# Patient Record
Sex: Female | Born: 1980 | Race: Black or African American | Hispanic: No | Marital: Single | Smoking: Never smoker
Health system: Southern US, Community
[De-identification: ages and names within clinical notes are randomized; demographics above are authoritative.]

## PROBLEM LIST (undated history)

## (undated) ENCOUNTER — Emergency Department (HOSPITAL_COMMUNITY): Payer: 59

## (undated) DIAGNOSIS — Z8619 Personal history of other infectious and parasitic diseases: Secondary | ICD-10-CM

## (undated) DIAGNOSIS — R87619 Unspecified abnormal cytological findings in specimens from cervix uteri: Secondary | ICD-10-CM

## (undated) DIAGNOSIS — E669 Obesity, unspecified: Secondary | ICD-10-CM

## (undated) DIAGNOSIS — G5600 Carpal tunnel syndrome, unspecified upper limb: Secondary | ICD-10-CM

## (undated) DIAGNOSIS — M722 Plantar fascial fibromatosis: Secondary | ICD-10-CM

## (undated) DIAGNOSIS — Z8613 Personal history of malaria: Secondary | ICD-10-CM

## (undated) DIAGNOSIS — M654 Radial styloid tenosynovitis [de Quervain]: Secondary | ICD-10-CM

## (undated) DIAGNOSIS — O24419 Gestational diabetes mellitus in pregnancy, unspecified control: Secondary | ICD-10-CM

## (undated) DIAGNOSIS — K7460 Unspecified cirrhosis of liver: Secondary | ICD-10-CM

## (undated) DIAGNOSIS — F810 Specific reading disorder: Secondary | ICD-10-CM

## (undated) DIAGNOSIS — N200 Calculus of kidney: Secondary | ICD-10-CM

## (undated) DIAGNOSIS — IMO0002 Reserved for concepts with insufficient information to code with codable children: Secondary | ICD-10-CM

## (undated) DIAGNOSIS — Z862 Personal history of diseases of the blood and blood-forming organs and certain disorders involving the immune mechanism: Secondary | ICD-10-CM

## (undated) DIAGNOSIS — Z8632 Personal history of gestational diabetes: Secondary | ICD-10-CM

## (undated) HISTORY — DX: Obesity, unspecified: E66.9

## (undated) HISTORY — DX: Unspecified abnormal cytological findings in specimens from cervix uteri: R87.619

## (undated) HISTORY — DX: Calculus of kidney: N20.0

## (undated) HISTORY — DX: Personal history of other infectious and parasitic diseases: Z86.19

## (undated) HISTORY — DX: Personal history of gestational diabetes: Z86.32

## (undated) HISTORY — DX: Personal history of diseases of the blood and blood-forming organs and certain disorders involving the immune mechanism: Z86.2

## (undated) HISTORY — DX: Personal history of malaria: Z86.13

## (undated) HISTORY — DX: Plantar fascial fibromatosis: M72.2

## (undated) HISTORY — DX: Reserved for concepts with insufficient information to code with codable children: IMO0002

## (undated) HISTORY — DX: Unspecified cirrhosis of liver: K74.60

## (undated) HISTORY — DX: Specific reading disorder: F81.0

## (undated) HISTORY — DX: Carpal tunnel syndrome, unspecified upper limb: G56.00

---

## 2001-07-08 DIAGNOSIS — Z8613 Personal history of malaria: Secondary | ICD-10-CM

## 2001-07-08 HISTORY — DX: Personal history of malaria: Z86.13

## 2005-06-24 ENCOUNTER — Encounter: Admission: RE | Admit: 2005-06-24 | Discharge: 2005-06-24 | Payer: Self-pay | Admitting: Internal Medicine

## 2005-07-08 DIAGNOSIS — Z8619 Personal history of other infectious and parasitic diseases: Secondary | ICD-10-CM

## 2005-07-08 HISTORY — DX: Personal history of other infectious and parasitic diseases: Z86.19

## 2006-03-21 ENCOUNTER — Inpatient Hospital Stay (HOSPITAL_COMMUNITY): Admission: AD | Admit: 2006-03-21 | Discharge: 2006-03-21 | Payer: Self-pay | Admitting: Family Medicine

## 2006-04-12 ENCOUNTER — Inpatient Hospital Stay (HOSPITAL_COMMUNITY): Admission: AD | Admit: 2006-04-12 | Discharge: 2006-04-12 | Payer: Self-pay | Admitting: Obstetrics and Gynecology

## 2006-06-03 ENCOUNTER — Ambulatory Visit (HOSPITAL_COMMUNITY): Admission: RE | Admit: 2006-06-03 | Discharge: 2006-06-03 | Payer: Self-pay | Admitting: Gynecology

## 2006-06-05 ENCOUNTER — Ambulatory Visit: Payer: Self-pay | Admitting: Obstetrics & Gynecology

## 2006-06-06 ENCOUNTER — Ambulatory Visit (HOSPITAL_COMMUNITY): Admission: RE | Admit: 2006-06-06 | Discharge: 2006-06-06 | Payer: Self-pay | Admitting: Family Medicine

## 2006-06-12 ENCOUNTER — Ambulatory Visit: Payer: Self-pay | Admitting: *Deleted

## 2006-06-26 ENCOUNTER — Ambulatory Visit: Payer: Self-pay | Admitting: Family Medicine

## 2006-06-27 ENCOUNTER — Ambulatory Visit: Payer: Self-pay | Admitting: Cardiovascular Disease

## 2006-07-04 ENCOUNTER — Ambulatory Visit (HOSPITAL_COMMUNITY): Admission: RE | Admit: 2006-07-04 | Discharge: 2006-07-04 | Payer: Self-pay | Admitting: Family Medicine

## 2006-07-10 ENCOUNTER — Ambulatory Visit: Payer: Self-pay | Admitting: Family Medicine

## 2006-07-24 ENCOUNTER — Ambulatory Visit: Payer: Self-pay | Admitting: Family Medicine

## 2006-07-30 ENCOUNTER — Ambulatory Visit (HOSPITAL_COMMUNITY): Admission: RE | Admit: 2006-07-30 | Discharge: 2006-07-30 | Payer: Self-pay | Admitting: Family Medicine

## 2006-08-21 ENCOUNTER — Ambulatory Visit: Payer: Self-pay | Admitting: Obstetrics & Gynecology

## 2006-08-25 ENCOUNTER — Ambulatory Visit: Payer: Self-pay | Admitting: Obstetrics & Gynecology

## 2006-08-28 ENCOUNTER — Ambulatory Visit (HOSPITAL_COMMUNITY): Admission: RE | Admit: 2006-08-28 | Discharge: 2006-08-28 | Payer: Self-pay | Admitting: Family Medicine

## 2006-08-28 ENCOUNTER — Ambulatory Visit: Payer: Self-pay | Admitting: Obstetrics & Gynecology

## 2006-09-01 ENCOUNTER — Ambulatory Visit: Payer: Self-pay | Admitting: Family Medicine

## 2006-09-08 ENCOUNTER — Ambulatory Visit: Payer: Self-pay | Admitting: Obstetrics & Gynecology

## 2006-09-11 ENCOUNTER — Ambulatory Visit: Payer: Self-pay | Admitting: Obstetrics & Gynecology

## 2006-09-15 ENCOUNTER — Ambulatory Visit: Payer: Self-pay | Admitting: *Deleted

## 2006-09-22 ENCOUNTER — Ambulatory Visit (HOSPITAL_COMMUNITY): Admission: RE | Admit: 2006-09-22 | Discharge: 2006-09-22 | Payer: Self-pay | Admitting: Obstetrics & Gynecology

## 2006-09-22 ENCOUNTER — Ambulatory Visit: Payer: Self-pay | Admitting: Obstetrics & Gynecology

## 2006-09-25 ENCOUNTER — Ambulatory Visit: Payer: Self-pay | Admitting: Obstetrics & Gynecology

## 2006-09-26 ENCOUNTER — Ambulatory Visit (HOSPITAL_COMMUNITY): Admission: RE | Admit: 2006-09-26 | Discharge: 2006-09-26 | Payer: Self-pay | Admitting: Family Medicine

## 2006-10-03 ENCOUNTER — Ambulatory Visit: Payer: Self-pay | Admitting: Gynecology

## 2006-10-03 ENCOUNTER — Ambulatory Visit (HOSPITAL_COMMUNITY): Admission: RE | Admit: 2006-10-03 | Discharge: 2006-10-03 | Payer: Self-pay | Admitting: Obstetrics & Gynecology

## 2006-10-06 ENCOUNTER — Ambulatory Visit: Payer: Self-pay | Admitting: *Deleted

## 2006-10-09 ENCOUNTER — Ambulatory Visit: Payer: Self-pay | Admitting: Family Medicine

## 2006-10-16 ENCOUNTER — Ambulatory Visit (HOSPITAL_COMMUNITY): Admission: RE | Admit: 2006-10-16 | Discharge: 2006-10-16 | Payer: Self-pay | Admitting: Family Medicine

## 2006-10-28 ENCOUNTER — Inpatient Hospital Stay (HOSPITAL_COMMUNITY): Admission: RE | Admit: 2006-10-28 | Discharge: 2006-11-01 | Payer: Self-pay | Admitting: Obstetrics & Gynecology

## 2006-10-28 ENCOUNTER — Ambulatory Visit: Payer: Self-pay | Admitting: *Deleted

## 2006-10-28 ENCOUNTER — Ambulatory Visit: Payer: Self-pay | Admitting: Obstetrics & Gynecology

## 2006-10-29 ENCOUNTER — Encounter (INDEPENDENT_AMBULATORY_CARE_PROVIDER_SITE_OTHER): Payer: Self-pay | Admitting: *Deleted

## 2006-11-17 ENCOUNTER — Encounter: Admission: RE | Admit: 2006-11-17 | Discharge: 2007-02-15 | Payer: Self-pay | Admitting: Obstetrics & Gynecology

## 2007-03-26 ENCOUNTER — Emergency Department (HOSPITAL_COMMUNITY): Admission: EM | Admit: 2007-03-26 | Discharge: 2007-03-27 | Payer: Self-pay | Admitting: Emergency Medicine

## 2008-09-17 IMAGING — US US OB COMP LESS 14 WK
1 series · 14 of 26 positions shown · non-contrast
Comparison: 03/21/06.

CLINICAL DATA: 11 weeks pregnant, vaginal bleeding.

 OBSTETRICAL ULTRASOUND <14 WKS:

[Series 1: us ob comp less 14 wk · 0.27mm/px · 14 of 26 slices shown]
[im 1/26]
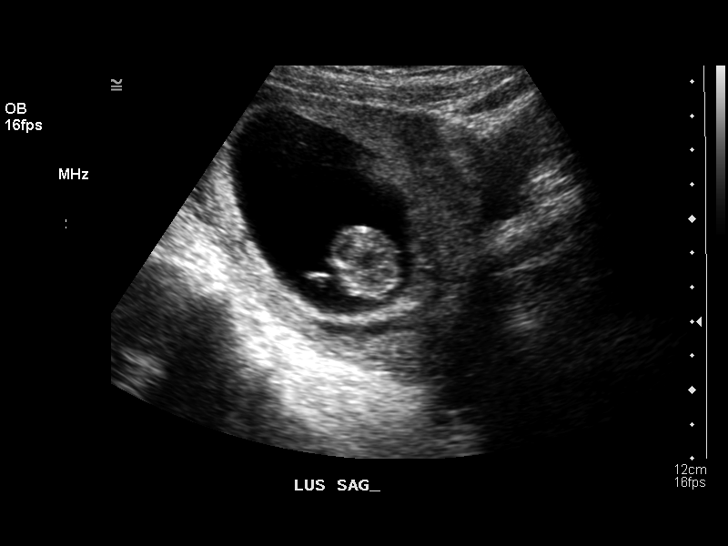
[im 3/26]
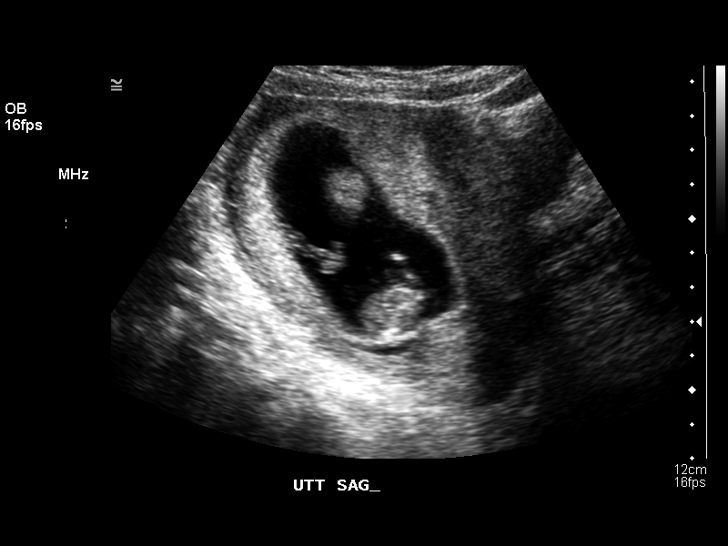
[im 5/26]
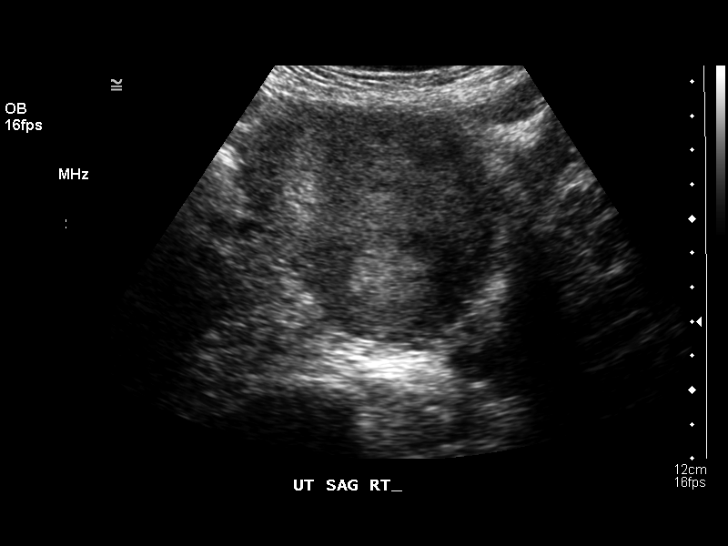
[im 7/26]
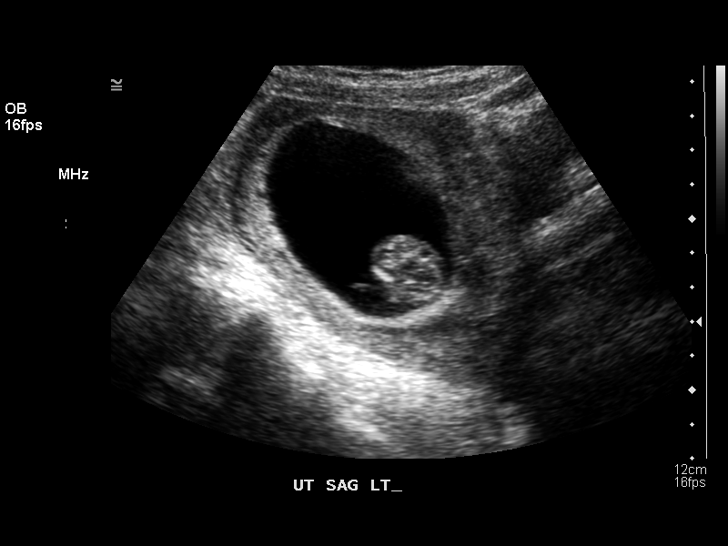
[im 9/26]
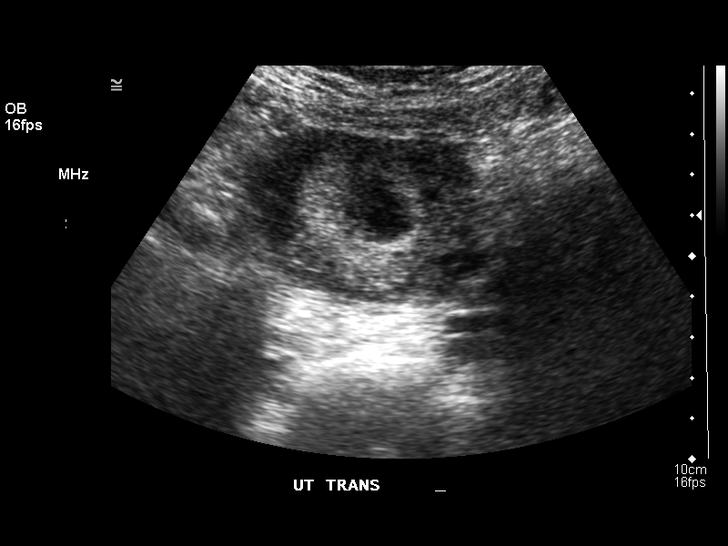
[im 11/26]
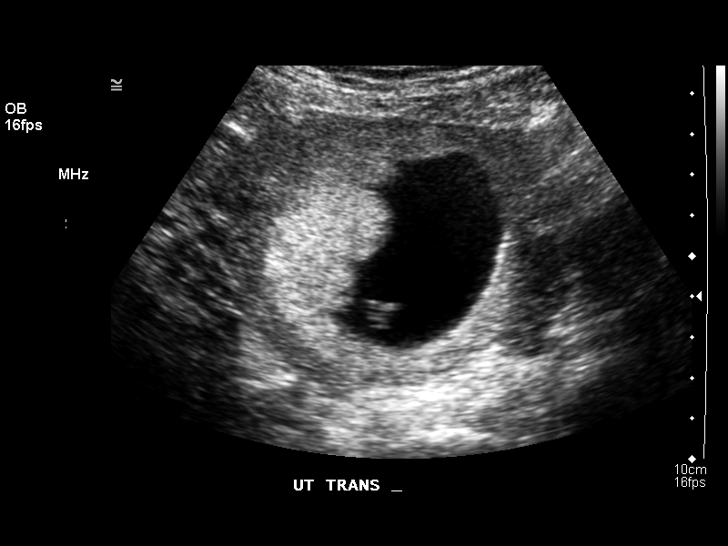
[im 13/26]
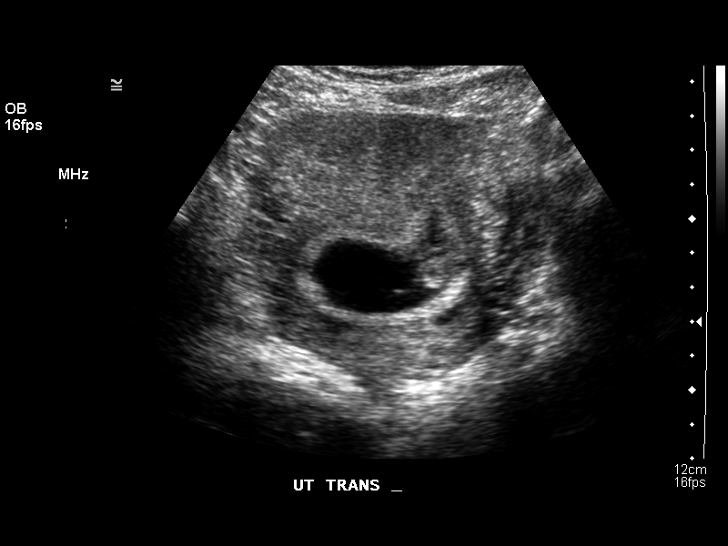
[im 14/26]
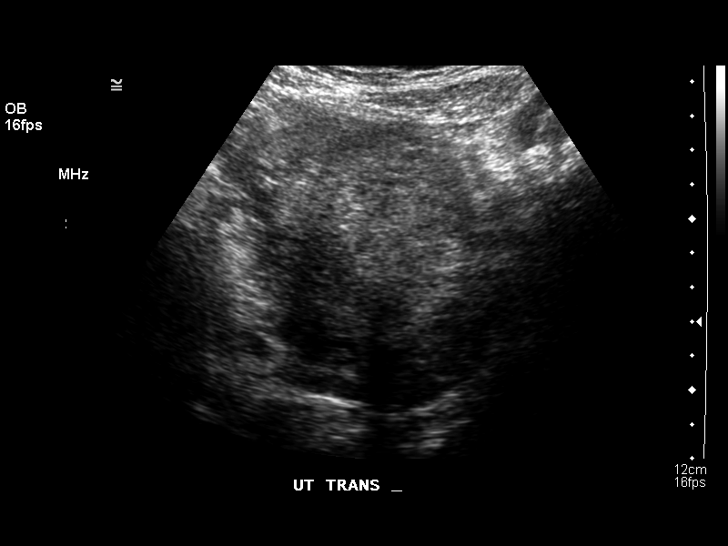
[im 16/26]
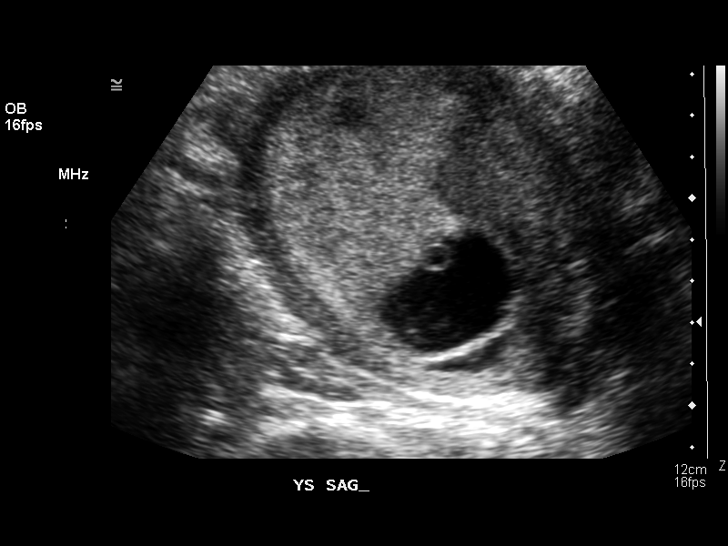
[im 18/26]
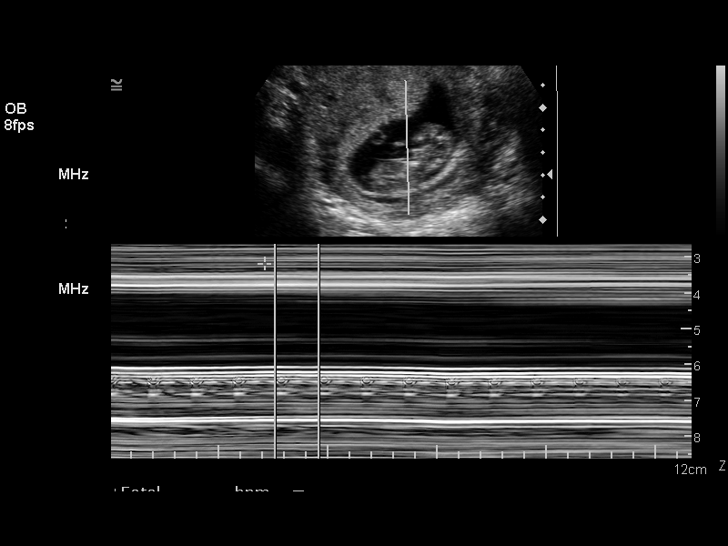
[im 20/26]
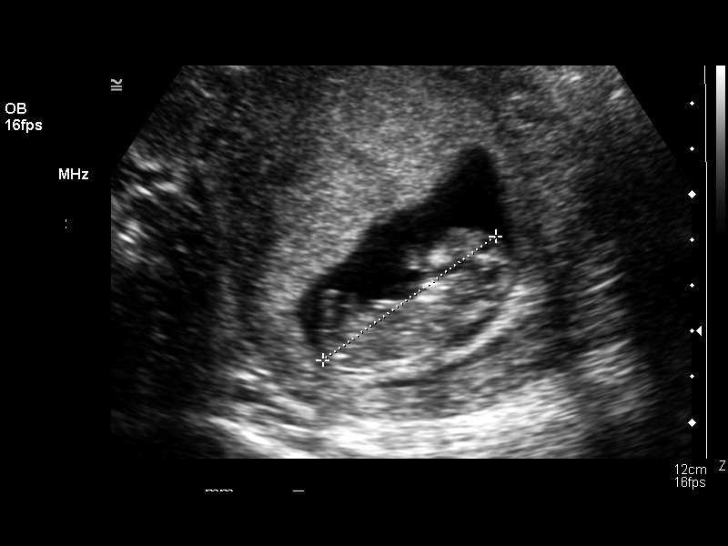
[im 22/26]
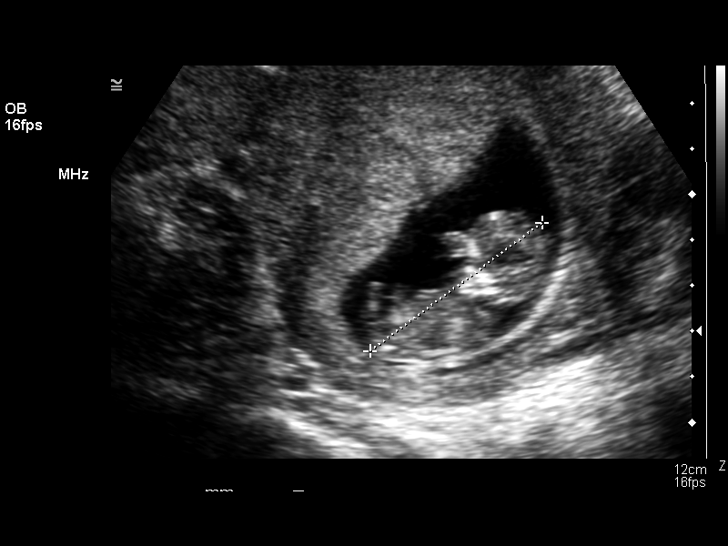
[im 24/26]
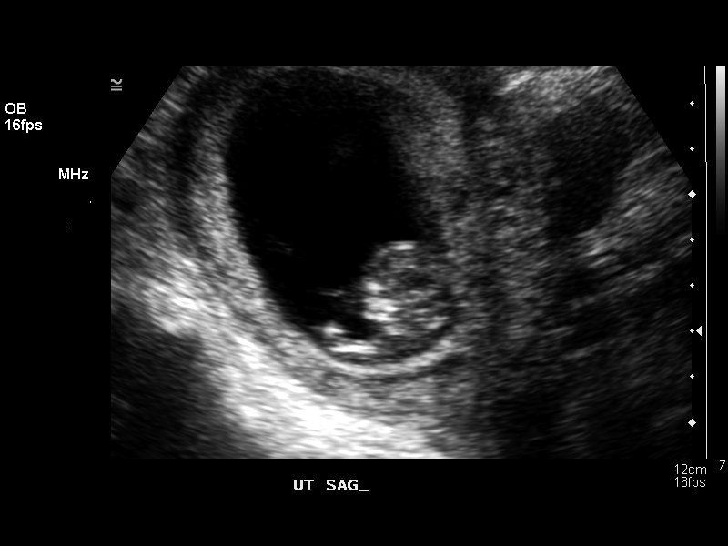
[im 26/26]
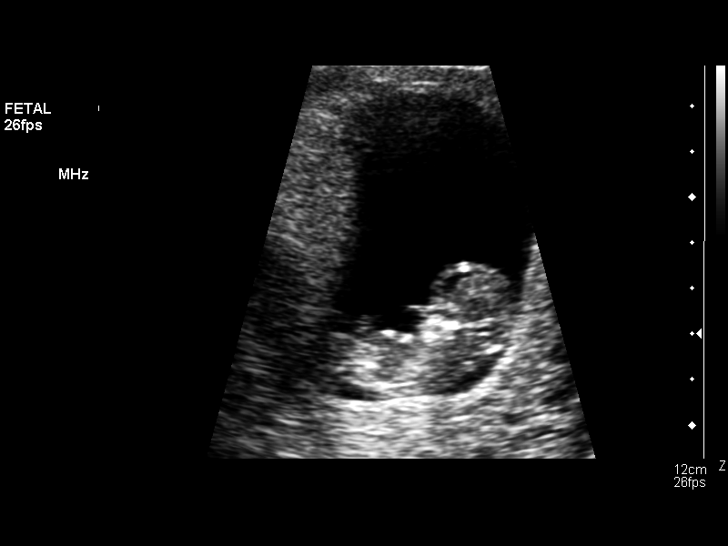

[14 of 26 positions shown; findings below may reference images not displayed]

Number of Fetuses:  1
 Heart Rate:  150 bpm

 CRL:  4.7 cm  11 w 3 d  US EDC:   10/29/06

 Fetal anatomy could not be evaluated due to the early gestational age.

 MATERNAL UTERINE AND ADNEXAL FINDINGS
 Cervix:  Not evaluated; <16 weeks.
IMPRESSION: Single live intrauterine gestation with average ultrasound age 11 weeks 3 days, which is concordant with the gestational age by first ultrasound of 11 weeks 1 day (EDC 10/31/06).  No acute findings seen.

## 2009-06-19 ENCOUNTER — Emergency Department (HOSPITAL_COMMUNITY): Admission: EM | Admit: 2009-06-19 | Discharge: 2009-06-19 | Payer: Self-pay | Admitting: Emergency Medicine

## 2010-07-29 ENCOUNTER — Encounter: Payer: Self-pay | Admitting: *Deleted

## 2010-10-09 LAB — POCT URINALYSIS DIP (DEVICE)
Glucose, UA: NEGATIVE mg/dL
Nitrite: NEGATIVE
Urobilinogen, UA: 1 mg/dL (ref 0.0–1.0)
pH: 5.5 (ref 5.0–8.0)

## 2010-10-09 LAB — WET PREP, GENITAL: Yeast Wet Prep HPF POC: NONE SEEN

## 2010-10-16 ENCOUNTER — Emergency Department (HOSPITAL_COMMUNITY)
Admission: EM | Admit: 2010-10-16 | Discharge: 2010-10-16 | Disposition: A | Discharge status: Home / Self Care | Payer: Worker's Compensation | Attending: Emergency Medicine | Admitting: Emergency Medicine

## 2010-10-16 DIAGNOSIS — Y929 Unspecified place or not applicable: Secondary | ICD-10-CM | POA: Insufficient documentation

## 2010-10-16 DIAGNOSIS — X500XXA Overexertion from strenuous movement or load, initial encounter: Secondary | ICD-10-CM | POA: Insufficient documentation

## 2010-10-16 DIAGNOSIS — S335XXA Sprain of ligaments of lumbar spine, initial encounter: Secondary | ICD-10-CM | POA: Insufficient documentation

## 2010-10-16 DIAGNOSIS — M545 Low back pain, unspecified: Secondary | ICD-10-CM | POA: Insufficient documentation

## 2010-10-16 DIAGNOSIS — Y990 Civilian activity done for income or pay: Secondary | ICD-10-CM | POA: Insufficient documentation

## 2010-10-16 DIAGNOSIS — Y93E2 Activity, laundry: Secondary | ICD-10-CM | POA: Insufficient documentation

## 2010-10-16 DIAGNOSIS — M2560 Stiffness of unspecified joint, not elsewhere classified: Secondary | ICD-10-CM | POA: Insufficient documentation

## 2010-11-23 NOTE — Discharge Summary (Signed)
NAMEJordyne, Poehlman Jeanna             ACCOUNT NO.:  [id number]   MEDICAL RECORD NO.:  [id number]           PATIENT TYPE:   LOCATION:                                FACILITY:  [facility]   PHYSICIAN:  Tracy L. Mayford Knife, M.D.DATE OF BIRTH:  1981-05-08   DATE OF ADMISSION:  10/28/2006  DATE OF DISCHARGE:  11/01/2006                               DISCHARGE SUMMARY   DISCHARGE DIAGNOSES:  1. Status post primary low transverse cesarean section for failure to      progress.  2. Gestational diabetes, A2, uncontrolled.  3. Group B strep positive.  4. Thrombocytopenia, stable.  5. Anemia, secondary to acute blood loss, stable.  6. Hepatitis B surface antigen positive.   LABORATORY DATA:  Admit hemoglobin 12, platelets 109, AST 44, ALT 22.  Hemoglobin A1c 5.7.  Discharge hemoglobin 8.1, platelets 107, AST 46,  ALT 20.   HISTORY OF PRESENT ILLNESS:  Patient is a 30 year old G4, P1-1-1-1 at 39  weeks, 4 days, dated via LMP and 8-week ultrasound.  She presented to  clinic for routine care.  Patient had not been seen since April 10.  Certified letters and multiple calls had been made.  When patient did  come in, her fasting test was between 60 to 90 and two-hour postprandial  test between 70 and 137.  BPT was 8/8.  Nonstress test was reactive.  Patient was admitted for induction because she was on insulin for her  gestational diabetes.   Please note that pregnancy course was complicated by her having  gestational diabetes, Group B strep bacteruria, hepatitis B surface  antigen positive, thrombocytopenia, which was thought to be secondary to  her hepatitis B history.  The patient also had a history of having an  IUFD at 26 weeks, when she lived in [place].  Patient reports that she  was on __________ for two days and that is why she was told she had the  IUFD.   Thrombophilia workup was normal, except there was a borderline decreased  protein C.  Dr. Margot Ables encouraged repeating this when she was  not  pregnant.   HOSPITAL COURSE:  The patient had a girth ultrasound.  Estimated fetal  weight was 4392 g with the abdominal circumference measuring greater  than the 87th percentile.  Patient was offered a primary cesarean  section, which she declined.  Pitocin augmentation was started.  Glucommander was started.  Patient progressed to 3 cm and had documented  [place] units greater than 334 hours.  Based on this, patient was  consented for primary cesarean section for failure to progress.  Surgery  was done without complication.  Please see operative note.  Baby was in  the vertex LOA presentation.   Postoperative course went well, except patient had incisional pain.  No  signs of infection.  She remained afebrile with a normal white count.  Blood count remained stable.  Her platelets went as low as 85,000, then  on discharge were back up to the baseline of 107,000.  Fasting CBG were  followed and were in the 90s to low 100s.   DISPOSITION:  Home.   FOLLOWUP:  Baby will nurse.  Take out staples on day 5 to day 7.  If  this is not done, she knows to call the clinic to make an appointment to  get those out.  Followup at the health department in six weeks, where  she will also have a two-hour glucose tolerance test to make sure that  she is not diabetic, even when not pregnant.   DISCHARGE MEDICATIONS:  1. Ibuprofen one p.o. q. 4-6 hours pain.  2. Percocet 5 one to two q. 4-6 pain.  3. Ferrous sulfate 325 one tab p.o. daily.  4. Colace 100 mg p.o. b.i.d.   CONTRACEPTION:  Patient will decide at six-week postpartum visit.   DIET:  Regular.   ACTIVITY:  Nothing per vagina.  No heavy lifting times six weeks.   DISCHARGE INSTRUCTIONS:  Routine postoperative orders given.           ______________________________  Marc Morgans. Mayford Knife, M.D.     TLW/MEDQ  D:  11/01/2006  T:  11/01/2006  Job:  [id number]

## 2010-11-23 NOTE — Op Note (Signed)
NAMEPhylisha, Brenda Cannon             ACCOUNT NO.:  [id number]   MEDICAL RECORD NO.:  [id number]          PATIENT TYPE:  WOC   LOCATION:  WOC                          FACILITY:  [facility]   PHYSICIAN:  Lesly Dukes, M.D. DATE OF BIRTH:  [birth year]   DATE OF PROCEDURE:  10/29/2006  DATE OF DISCHARGE:                               OPERATIVE REPORT   PREOPERATIVE DIAGNOSIS:  A 30 year old female at 39/4 weeks estimated  gestational age with failure to dilate and uncontrolled diabetes, and  hepatitis-B.   POSTOPERATIVE DIAGNOSIS:  A 30 year old female at 39/4 weeks estimated  gestational age with failure to dilate and uncontrolled diabetes, and  hepatitis-B.   PROCEDURE PERFORMED:  Primary low transverse cesarean section.   SURGEON:  Lesly Dukes, M.D.   ASSISTANT:  Caren Griffins, C.N.M.   ANESTHESIA:  Epidural.   PATHOLOGY:  Placenta.   ESTIMATED BLOOD LOSS:  1000.   COMPLICATIONS:  None.   FINDINGS:  Viable female infant with Apgars 9 at one minute and 9 at five  minutes.  Vertex/LOA presentation.  Weight 7 pounds, 12 ounces.  Clear  fluid.  Grossly normal placenta with three vessel cord.  Grossly normal  uterus, ovaries and fallopian tubes.   DESCRIPTION OF PROCEDURE:  After informed consent was obtained, the  patient was taken to the operating room where epidural anesthesia was  found to be adequate.  The patient was placed in the dorsal lithotomy  position with leftward tilt, with a Foley in the bladder.  The patient  was prepared and draped in the normal sterile fashion.  A Pfannenstiel  skin incision was made with the scalpel and carried down to the fascia.  The fascia was incised in the midline and the incision extended  bilaterally with Mayo scissors.  The superior and inferior aspect of the  fascial incision were grasped with Kocher clamps, tented up and  dissected sharply and bluntly from the underlying layers of rectus  muscles.  The rectus muscles were separated  in the midline and the  peritoneum entered bluntly and this incision was then extended  superiorly and inferiorly with good visualization of the bladder.  A  bladder blade was inserted.  A bladder flap was created and the uterine  incision was made in transverse fashion in the lower uterine segment.  This incision was extended bilaterally bluntly.  The baby's head was  delivered atraumatically.  The nose and mouth were suctioned.  The cord  was clamped and cut and the baby handed to the waiting pediatrician.  Cord blood was sent for type and screen.  The placenta was delivered  manually and had a three vessel cord.  The uterus was cleared of all  clots and debris and noted to be slightly atonic.  Epogen was given the  anesthetist.  The uterine incision was closed with 0 Vicryl in a running  lock fashion and a second suture of 0 Vicryl was used to aid in  hemostasis and reinforce the incision.  Good hemostasis was noted from  the uterine incision.  The peritoneum and rectus muscles were  hemostatic.  The fascia was closed with 0 Vicryl in a running fashion.  The subcutaneous tissue  was copiously irrigated and found to be hemostatic.  The skin was closed  with staples.  The patient tolerated the procedure well.  Sponge, lap,  instrument and needle counts were correct x2.  The patient went to the  recovery room in stable condition.           ______________________________  Lesly Dukes, M.D.     KHL/MEDQ  D:  10/29/2006  T:  10/29/2006  Job:  [id number]

## 2011-01-05 ENCOUNTER — Emergency Department (HOSPITAL_COMMUNITY): Payer: Medicaid Other

## 2011-01-05 ENCOUNTER — Emergency Department (HOSPITAL_COMMUNITY)
Admission: EM | Admit: 2011-01-05 | Discharge: 2011-01-06 | Disposition: A | Discharge status: Home / Self Care | Payer: Medicaid Other | Attending: Emergency Medicine | Admitting: Emergency Medicine

## 2011-01-05 DIAGNOSIS — N898 Other specified noninflammatory disorders of vagina: Secondary | ICD-10-CM | POA: Insufficient documentation

## 2011-01-05 DIAGNOSIS — O200 Threatened abortion: Secondary | ICD-10-CM | POA: Insufficient documentation

## 2011-01-05 DIAGNOSIS — N949 Unspecified condition associated with female genital organs and menstrual cycle: Secondary | ICD-10-CM | POA: Insufficient documentation

## 2011-01-05 LAB — WET PREP, GENITAL
Trich, Wet Prep: NONE SEEN
WBC, Wet Prep HPF POC: NONE SEEN

## 2011-01-05 LAB — HCG, QUANTITATIVE, PREGNANCY: hCG, Beta Chain, Quant, S: 11849 m[IU]/mL — ABNORMAL HIGH (ref <5)

## 2011-01-05 LAB — POCT PREGNANCY, URINE: Preg Test, Ur: POSITIVE

## 2011-01-07 ENCOUNTER — Inpatient Hospital Stay (HOSPITAL_COMMUNITY)
Admission: AD | Admit: 2011-01-07 | Discharge: 2011-01-07 | Disposition: A | Discharge status: Home / Self Care | Payer: Medicaid Other | Source: Ambulatory Visit | Attending: Obstetrics & Gynecology | Admitting: Obstetrics & Gynecology

## 2011-01-07 DIAGNOSIS — O209 Hemorrhage in early pregnancy, unspecified: Secondary | ICD-10-CM

## 2011-01-07 LAB — GC/CHLAMYDIA PROBE AMP, GENITAL: GC Probe Amp, Genital: NEGATIVE

## 2011-02-21 ENCOUNTER — Inpatient Hospital Stay (HOSPITAL_COMMUNITY)
Admission: AD | Admit: 2011-02-21 | Discharge: 2011-02-21 | Disposition: A | Discharge status: Home / Self Care | Payer: Medicaid Other | Source: Ambulatory Visit | Attending: Obstetrics and Gynecology | Admitting: Obstetrics and Gynecology

## 2011-02-21 ENCOUNTER — Inpatient Hospital Stay (HOSPITAL_COMMUNITY): Payer: Medicaid Other

## 2011-02-21 ENCOUNTER — Encounter (HOSPITAL_COMMUNITY): Payer: Self-pay

## 2011-02-21 DIAGNOSIS — O209 Hemorrhage in early pregnancy, unspecified: Secondary | ICD-10-CM

## 2011-02-21 DIAGNOSIS — A499 Bacterial infection, unspecified: Secondary | ICD-10-CM | POA: Insufficient documentation

## 2011-02-21 DIAGNOSIS — N760 Acute vaginitis: Secondary | ICD-10-CM | POA: Insufficient documentation

## 2011-02-21 DIAGNOSIS — B9689 Other specified bacterial agents as the cause of diseases classified elsewhere: Secondary | ICD-10-CM | POA: Insufficient documentation

## 2011-02-21 DIAGNOSIS — O2390 Unspecified genitourinary tract infection in pregnancy, unspecified trimester: Secondary | ICD-10-CM | POA: Insufficient documentation

## 2011-02-21 HISTORY — DX: Gestational diabetes mellitus in pregnancy, unspecified control: O24.419

## 2011-02-21 LAB — CBC
MCHC: 34 g/dL (ref 30.0–36.0)
RDW: 13.3 % (ref 11.5–15.5)
WBC: 4 10*3/uL (ref 4.0–10.5)

## 2011-02-21 LAB — WET PREP, GENITAL: Trich, Wet Prep: NONE SEEN

## 2011-02-21 MED ORDER — METRONIDAZOLE 500 MG PO TABS: 500.0000 mg | ORAL_TABLET | Freq: Two times a day (BID) | ORAL | Status: AC

## 2011-02-21 NOTE — ED Provider Notes (Addendum)
History   Pt presents today c/o vag bleeding this morning. She states she was leaving the HD after doing her prenatal paperwork and she noticed the bleeding. She denies severe pain. Her last episode of intercourse was about 3wks ago.  Chief Complaint  Patient presents with  . Vaginal Bleeding   HPI  OB History    Grav Para Term Preterm Abortions TAB SAB Ect Mult Living   4 2 2  0 1 1 0 0 0 2      Past Medical History  Diagnosis Date  . Gestational diabetes     pt does not have Diabete just gestational with last pregnanxy    Past Surgical History  Procedure Date  . Cesarean section 2008    last pregnacy    No family history on file.  History  Substance Use Topics  . Smoking status: Never Smoker   . Smokeless tobacco: Not on file  . Alcohol Use: No    Allergies: No Known Allergies  No prescriptions prior to admission    Review of Systems  Constitutional: Negative for fever.  Gastrointestinal: Positive for abdominal pain. Negative for nausea, vomiting, diarrhea and constipation.  Genitourinary: Negative for dysuria, urgency, frequency, hematuria and flank pain.  Neurological: Negative for dizziness and headaches.  Psychiatric/Behavioral: Negative for depression and suicidal ideas.   Physical Exam   Blood pressure 113/78, pulse 93, temperature 98.9 F (37.2 C), temperature source Oral, resp. rate 16, height 5' (1.524 m), weight 181 lb 12.8 oz (82.464 kg), last menstrual period 11/24/2010.  Physical Exam  Constitutional: She is oriented to person, place, and time. She appears well-developed and well-nourished. No distress.  HENT:  Head: Normocephalic and atraumatic.  Eyes: EOM are normal. Pupils are equal, round, and reactive to light.  GI: Soft. She exhibits no distension. There is tenderness. There is no rebound and no guarding.  Genitourinary: There is bleeding around the vagina. Vaginal discharge found.  Neurological: She is alert and oriented to person,  place, and time.  Skin: Skin is warm and dry. She is not diaphoretic.  Psychiatric: She has a normal mood and affect. Her behavior is normal. Judgment and thought content normal.    MAU Course  Procedures  Wet prep and cultures performed. Pt is O positive.  Results for orders placed during the hospital encounter of 02/21/11 (from the past 24 hour(s))  WET PREP, GENITAL     Status: Abnormal   Collection Time   02/21/11 12:10 PM      Component Value Range   Yeast, Wet Prep NONE SEEN  NONE SEEN    Trich, Wet Prep NONE SEEN  NONE SEEN    Clue Cells, Wet Prep MODERATE (*) NONE SEEN    WBC, Wet Prep HPF POC FEW (*) NONE SEEN   CBC     Status: Abnormal   Collection Time   02/21/11 12:45 PM      Component Value Range   WBC 4.0  4.0 - 10.5 (K/uL)   RBC 4.17  3.87 - 5.11 (MIL/uL)   Hemoglobin 12.1  12.0 - 15.0 (g/dL)   HCT 78.2 (*) 95.6 - 46.0 (%)   MCV 85.4  78.0 - 100.0 (fL)   MCH 29.0  26.0 - 34.0 (pg)   MCHC 34.0  30.0 - 36.0 (g/dL)   RDW 21.3  08.6 - 57.8 (%)   Platelets 105 (*) 150 - 400 (K/uL)   US shows single IUP consistent with EDC. No abruption or previa noted. Assessment  and Plan  Vag bleeding in preg: at this time there is no definitive source of bleeding. Will tx with Flagyl secondary to BV. Discussed diet, activity, risks, and precautions.   Clinton Gallant. Layney Gillson III, DrHSc, MPAS, PA-C  02/21/2011, 12:13 PM

## 2011-02-21 NOTE — Progress Notes (Signed)
Patient reports had appointment at [facility] Dept for paperwork this morning onset of vagina bleeding having cramping

## 2011-02-22 ENCOUNTER — Encounter (HOSPITAL_COMMUNITY): Payer: Self-pay | Admitting: Obstetrics and Gynecology

## 2011-02-22 ENCOUNTER — Inpatient Hospital Stay (HOSPITAL_COMMUNITY)
Admission: AD | Admit: 2011-02-22 | Discharge: 2011-02-23 | Disposition: A | Discharge status: Home / Self Care | Payer: Medicaid Other | Source: Ambulatory Visit | Attending: Obstetrics & Gynecology | Admitting: Obstetrics & Gynecology

## 2011-02-22 DIAGNOSIS — O210 Mild hyperemesis gravidarum: Secondary | ICD-10-CM | POA: Insufficient documentation

## 2011-02-22 DIAGNOSIS — O0901 Supervision of pregnancy with history of infertility, first trimester: Secondary | ICD-10-CM

## 2011-02-22 DIAGNOSIS — K2970 Gastritis, unspecified, without bleeding: Secondary | ICD-10-CM

## 2011-02-22 DIAGNOSIS — K2990 Gastroduodenitis, unspecified, without bleeding: Secondary | ICD-10-CM

## 2011-02-22 DIAGNOSIS — R51 Headache: Secondary | ICD-10-CM

## 2011-02-22 MED ORDER — LACTATED RINGERS IV SOLN
INTRAVENOUS | Status: DC
  Administered 2011-02-22: 23:00:00 via INTRAVENOUS

## 2011-02-22 MED ORDER — PROCHLORPERAZINE EDISYLATE 5 MG/ML IJ SOLN
10.0000 mg | Freq: Four times a day (QID) | INTRAMUSCULAR | Status: DC | PRN
  Filled 2011-02-22: qty 2

## 2011-02-22 MED ORDER — DIPHENHYDRAMINE HCL 50 MG/ML IJ SOLN
25.0000 mg | Freq: Four times a day (QID) | INTRAMUSCULAR | Status: DC | PRN
  Administered 2011-02-23: 25 mg via INTRAVENOUS
  Filled 2011-02-22: qty 1

## 2011-02-22 MED ORDER — DIPHENHYDRAMINE HCL 25 MG PO CAPS: 25.0000 mg | ORAL_CAPSULE | Freq: Four times a day (QID) | ORAL | Status: DC | PRN

## 2011-02-22 MED ORDER — DEXAMETHASONE SODIUM PHOSPHATE 10 MG/ML IJ SOLN
10.0000 mg | Freq: Once | INTRAMUSCULAR | Status: AC
  Administered 2011-02-23: 10 mg via INTRAVENOUS
  Filled 2011-02-22: qty 1

## 2011-02-22 MED ORDER — ONDANSETRON HCL 4 MG/2ML IJ SOLN
4.0000 mg | Freq: Once | INTRAMUSCULAR | Status: AC
  Administered 2011-02-22: 4 mg via INTRAVENOUS
  Filled 2011-02-22: qty 2

## 2011-02-22 NOTE — ED Provider Notes (Signed)
Chief Complaint:  Nausea and Vomiting  Brenda Cannon is  30 y.o. [id number].  Patient's last menstrual period was 11/24/2010.Marland Kitchen  Her pregnancy status is positive. [redacted]w[redacted]d. IUP confirmed with [place] yesterday in MAU.  She presents complaining of nausea/vomiting. Onset is described as insidious and has been present for  1 days. Pt reports 7 episodes of vomiting today. Denies abd pain. Orgininally presented yesterday to MAU with bleeding and was dx with BV, currently taking flagyl. Describes pink spotting only on tissue after void. Denies abd pain. Reports HA that started after vomiting. Denies visual disturbance.  Obstetrical/Gynecological History: OB History    Grav Para Term Preterm Abortions TAB SAB Ect Mult Living   4 2 2  0 1 1 0 0 0 2      Past Medical History: Past Medical History  Diagnosis Date  . Gestational diabetes     GDM with last pregnancy (2008)    Past Surgical History: Past Surgical History  Procedure Date  . Cesarean section 2008    last pregnacy    Family History: No family history on file.  Social History: History  Substance Use Topics  . Smoking status: Never Smoker   . Smokeless tobacco: Not on file  . Alcohol Use: No    Allergies: No Known Allergies  Prescriptions prior to admission  Medication Sig Dispense Refill  . metroNIDAZOLE (FLAGYL) 500 MG tablet Take 1 tablet (500 mg total) by mouth 2 (two) times daily.  14 tablet  0  . Prenatal Vit-Fe Fumarate-FA (PRENATAL PLUS) 65-1 MG TABS Take 1 tablet by mouth daily.          Review of Systems - History obtained from the patient General ROS: positive for  - nausea/vomiting/HA Allergy and Immunology ROS: negative Respiratory ROS: no cough, shortness of breath, or wheezing Cardiovascular ROS: no chest pain or dyspnea on exertion Gastrointestinal ROS: positive for - nausea/vomiting negative for - abdominal pain, change in stools, constipation, diarrhea, gas/bloating or hematemesis Genito-Urinary ROS: pink  vag spotting, denies dysuria or s/s UTI  Physical Exam   Blood pressure 98/50, temperature 97.9 F (36.6 C), temperature source Oral, last menstrual period 11/24/2010.  General: General appearance - alert, well appearing, and in no distress, oriented to person, place, and time and overweight Mental status - normal mood, behavior, speech, dress, motor activity, and thought processes Eyes - pupils equal and reactive, extraocular eye movements intact Mouth - mucous membranes moist, pharynx normal without lesions Neck - supple, no significant adenopathy Abdomen - soft, nontender, nondistended, no masses or organomegaly no CVA tenderness Neuro: CN I-VI grossly intact Focused Gynecological Exam: examination not indicated  Labs: Recent Results (from the past 24 hour(s))  URINALYSIS, ROUTINE W REFLEX MICROSCOPIC   Collection Time   02/23/11 12:13 AM      Component Value Range   Color, Urine YELLOW  YELLOW    Appearance CLEAR  CLEAR    Specific Gravity, Urine 1.010  1.005 - 1.030    pH 6.0  5.0 - 8.0    Glucose, UA NEGATIVE  NEGATIVE (mg/dL)   Hgb urine dipstick LARGE (*) NEGATIVE    Bilirubin Urine NEGATIVE  NEGATIVE    Ketones, ur NEGATIVE  NEGATIVE (mg/dL)   Protein, ur NEGATIVE  NEGATIVE (mg/dL)   Urobilinogen, UA 0.2  0.0 - 1.0 (mg/dL)   Nitrite NEGATIVE  NEGATIVE    Leukocytes, UA NEGATIVE  NEGATIVE   URINE MICROSCOPIC-ADD ON   Collection Time   02/23/11 12:13 AM  Component Value Range   Squamous Epithelial / LPF RARE  RARE    WBC, UA 0-2  <3 (WBC/hpf)   RBC / HPF 3-6  <3 (RBC/hpf)   Bacteria, UA FEW (*) RARE    ED COURSE: Tolerating po fluids after IVF, Zofran Headache relieved with Dexamethasone and Benadryl  Imaging Studies:  US Ob Comp Less 14 Wks  02/21/2011  IUP: CRL [redacted]w[redacted]d c/w Gest Age by LMP of [redacted]w[redacted]d   Assessment: Gastritis Headache Pregnancy  Plan: D/C Home  Rx Phenergan 25 mg po PRN  Tylenol PRN HA Refer: [facility] to initiate PNC  Tomoya Ringwald  E. 02/23/2011,12:41 AM

## 2011-02-22 NOTE — Progress Notes (Signed)
Pt states, " I started vomiting at 7 pm tonight, and now it is seven times . I have a headache that started at the same time. I went to the bathroom at 2000 and when I wiped I saw blood , and there was a little bit in my panties. I was here yesterday for bleeding and they gave me an antibiotic. I took it this morning and didn't throw up."

## 2011-02-22 NOTE — Progress Notes (Signed)
Pt asked to obtain a urine specimen for testing and pt states, "I don't feel like I need to pee right now."

## 2011-02-23 LAB — URINALYSIS, ROUTINE W REFLEX MICROSCOPIC
Leukocytes, UA: NEGATIVE
Nitrite: NEGATIVE
Protein, ur: NEGATIVE mg/dL
Specific Gravity, Urine: 1.01 (ref 1.005–1.030)
Urobilinogen, UA: 0.2 mg/dL (ref 0.0–1.0)

## 2011-02-23 LAB — URINE MICROSCOPIC-ADD ON

## 2011-02-23 LAB — GC/CHLAMYDIA PROBE AMP, GENITAL
Chlamydia, DNA Probe: NEGATIVE
GC Probe Amp, Genital: NEGATIVE

## 2011-02-23 MED ORDER — PROMETHAZINE HCL 50 MG PO TABS: 25.0000 mg | ORAL_TABLET | Freq: Four times a day (QID) | ORAL | Status: AC | PRN

## 2011-02-24 NOTE — ED Provider Notes (Signed)
Attestation of Attending Supervision of Advanced Practitioner: Evaluation and management procedures were performed by the PA/NP/CNM/OB Fellow under my supervision/collaboration. Agree with the note and plan of care for this patient.  ANYANWU,UGONNA A 02/24/2011 11:01 AM   

## 2011-02-27 LAB — HEPATITIS B SURFACE ANTIGEN: Hepatitis B Surface Ag: POSITIVE

## 2011-02-27 LAB — GLUCOSE TOLERANCE, 1 HOUR: GTT, 1 hr: 193

## 2011-02-27 LAB — HIV ANTIBODY (ROUTINE TESTING W REFLEX)
HIV: NONREACTIVE
HIV: NONREACTIVE
HIV: NONREACTIVE

## 2011-02-27 LAB — RPR: RPR: NONREACTIVE

## 2011-02-27 LAB — RUBELLA ANTIBODY, IGM: Rubella: IMMUNE

## 2011-02-27 LAB — ANTIBODY SCREEN: Antibody Screen: NEGATIVE

## 2011-03-01 DIAGNOSIS — Z98891 History of uterine scar from previous surgery: Secondary | ICD-10-CM

## 2011-03-01 DIAGNOSIS — B181 Chronic viral hepatitis B without delta-agent: Secondary | ICD-10-CM | POA: Insufficient documentation

## 2011-03-01 DIAGNOSIS — Z8632 Personal history of gestational diabetes: Secondary | ICD-10-CM

## 2011-03-01 DIAGNOSIS — Z862 Personal history of diseases of the blood and blood-forming organs and certain disorders involving the immune mechanism: Secondary | ICD-10-CM

## 2011-03-01 DIAGNOSIS — O09299 Supervision of pregnancy with other poor reproductive or obstetric history, unspecified trimester: Secondary | ICD-10-CM

## 2011-03-01 DIAGNOSIS — O24919 Unspecified diabetes mellitus in pregnancy, unspecified trimester: Secondary | ICD-10-CM | POA: Insufficient documentation

## 2011-03-01 DIAGNOSIS — B1910 Unspecified viral hepatitis B without hepatic coma: Secondary | ICD-10-CM

## 2011-03-01 NOTE — Progress Notes (Unsigned)
Ref from [facility]

## 2011-03-04 ENCOUNTER — Encounter: Payer: Medicaid Other | Attending: Obstetrics & Gynecology | Admitting: Dietician

## 2011-03-04 ENCOUNTER — Ambulatory Visit (INDEPENDENT_AMBULATORY_CARE_PROVIDER_SITE_OTHER): Payer: Medicaid Other | Admitting: Family Medicine

## 2011-03-04 DIAGNOSIS — O99810 Abnormal glucose complicating pregnancy: Secondary | ICD-10-CM | POA: Insufficient documentation

## 2011-03-04 DIAGNOSIS — Z98891 History of uterine scar from previous surgery: Secondary | ICD-10-CM

## 2011-03-04 DIAGNOSIS — Z713 Dietary counseling and surveillance: Secondary | ICD-10-CM | POA: Insufficient documentation

## 2011-03-04 DIAGNOSIS — Z9889 Other specified postprocedural states: Secondary | ICD-10-CM

## 2011-03-04 DIAGNOSIS — Z862 Personal history of diseases of the blood and blood-forming organs and certain disorders involving the immune mechanism: Secondary | ICD-10-CM

## 2011-03-04 DIAGNOSIS — Z8632 Personal history of gestational diabetes: Secondary | ICD-10-CM

## 2011-03-04 DIAGNOSIS — B1910 Unspecified viral hepatitis B without hepatic coma: Secondary | ICD-10-CM

## 2011-03-04 DIAGNOSIS — O09299 Supervision of pregnancy with other poor reproductive or obstetric history, unspecified trimester: Secondary | ICD-10-CM

## 2011-03-04 DIAGNOSIS — Z8613 Personal history of malaria: Secondary | ICD-10-CM

## 2011-03-04 LAB — POCT URINALYSIS DIP (DEVICE)
Ketones, ur: 40 mg/dL — AB
Leukocytes, UA: NEGATIVE
Specific Gravity, Urine: >=1.03 (ref 1.005–1.030)
pH: 6 (ref 5.0–8.0)

## 2011-03-04 MED ORDER — GLUCOSE BLOOD VI STRP: ORAL_STRIP | Status: DC

## 2011-03-04 MED ORDER — ACCU-CHEK FASTCLIX LANCET KIT: 1.0000 | PACK | Freq: Four times a day (QID) | Status: DC

## 2011-03-04 MED ORDER — ACCU-CHEK NANO SMARTVIEW W/DEVICE KIT: 1.0000 | PACK | Freq: Once | Status: DC

## 2011-03-04 NOTE — Progress Notes (Signed)
C/o nose bleeds.  Pressure: pelvic

## 2011-03-04 NOTE — Progress Notes (Signed)
Completed GDM self-management education and meter instruction.Provided handouts "Nutrition, Diabetes and Pregnancy', Novonordisk Carbohydrate Counting Guide.  Provided an Accu-Chek Nano Meter.  Return demo revealed a glucose of 125 mg.  Meter lot #:[id number] Expiration date:06/06/2012.  Instructed to monitor fasting and 2 hours post meal BG.  To bring meter and book to all clinic appointments.  Maggie Hillel Card, RN, RD, CDE

## 2011-03-04 NOTE — Progress Notes (Signed)
  Subjective:    Brenda Cannon is a 30 y.o. female being seen today for her obstetrical visit. She is at [redacted]w[redacted]d gestation. Patient reports no complaints. Fetal movement: not yet detected by patient report. Patient has a history of malaria diagnosed in 1997, but was deemed disease free after treatment, and denies any relapse symptoms. She has history of c-section with last delivery, desires TOLAC, and to breast/formula feed. Was told with previous pregnancy she had GDM and tested positive with 1-hr at HD with this pregnancy, thus referral to [facility] today. She states that she had her pap at HD last week. She also has Hep B, and thrombocytopenia.   Menstrual History: OB History    Grav Para Term Preterm Abortions TAB SAB Ect Mult Living   5 3 2 1 1  1  0 0 2       Patient's last menstrual period was 11/24/2010.     Objective:     BP 94/67  Temp 98.5 F (36.9 C)  Wt 81.058 kg (178 lb 11.2 oz)  LMP 11/24/2010 Uterine Size: palpates midway from pubis to umbillicus  FHTs 155  General AAO, NAD   Heart RRR, no murmur   Lungs CTA B/L   Thyroid Nonpalpable   Abd Soft, nontender                      Assessment:    Pregnancy 14 and 2/7 weeks   Plan:    Problem list reviewed and updated. Labs reviewed. Pap has not yet been resulted. Had GC/Ch and wet prep previously, negative. Thrombocytopenia noted. 1-hr 193. AFP3 discussed: will discuss next week when time for testing. Follow up in 1 weeks. >50% of 30 minute visit spent on counseling and coordination of care because patient spent time with Diabetes Educator, Nutritionist, and Child psychotherapist. Pregnancy precautions discussed.

## 2011-03-06 LAB — CULTURE, URINE COMPREHENSIVE

## 2011-03-07 ENCOUNTER — Telehealth (HOSPITAL_COMMUNITY): Payer: Self-pay | Admitting: Family Medicine

## 2011-03-07 DIAGNOSIS — Z98891 History of uterine scar from previous surgery: Secondary | ICD-10-CM

## 2011-03-07 DIAGNOSIS — Z8632 Personal history of gestational diabetes: Secondary | ICD-10-CM

## 2011-03-07 DIAGNOSIS — O2340 Unspecified infection of urinary tract in pregnancy, unspecified trimester: Secondary | ICD-10-CM

## 2011-03-07 DIAGNOSIS — Z862 Personal history of diseases of the blood and blood-forming organs and certain disorders involving the immune mechanism: Secondary | ICD-10-CM

## 2011-03-07 DIAGNOSIS — B1910 Unspecified viral hepatitis B without hepatic coma: Secondary | ICD-10-CM

## 2011-03-07 DIAGNOSIS — O09299 Supervision of pregnancy with other poor reproductive or obstetric history, unspecified trimester: Secondary | ICD-10-CM

## 2011-03-07 MED ORDER — CEPHALEXIN 500 MG PO CAPS: 500.0000 mg | ORAL_CAPSULE | Freq: Four times a day (QID) | ORAL | Status: DC

## 2011-03-07 NOTE — Telephone Encounter (Signed)
Please notify patient that she had a UTI on her urine culture and that medication has been prescribed for her to pick up at her pharmacy. Thanks!

## 2011-03-07 NOTE — Telephone Encounter (Signed)
Called pt to inform of + UTI and Rx sent to her pharmacy. Unable to leave message on home tel# due to mailbox is full. Pt's mobile tel# has been disconnected.

## 2011-03-08 NOTE — Telephone Encounter (Signed)
Telephone pt and could not leave message due to mailbox being full.

## 2011-03-12 NOTE — Telephone Encounter (Signed)
Called pt.  I informed her of + UTI and need for antibiotic RX. Pt states she went to pharmacy today to pick up med and will take as directed. Pt had no further questions.

## 2011-03-18 ENCOUNTER — Ambulatory Visit (INDEPENDENT_AMBULATORY_CARE_PROVIDER_SITE_OTHER): Payer: Medicaid Other | Admitting: Obstetrics & Gynecology

## 2011-03-18 ENCOUNTER — Encounter: Payer: Self-pay | Admitting: Obstetrics & Gynecology

## 2011-03-18 DIAGNOSIS — Z98891 History of uterine scar from previous surgery: Secondary | ICD-10-CM

## 2011-03-18 DIAGNOSIS — Z8632 Personal history of gestational diabetes: Secondary | ICD-10-CM

## 2011-03-18 DIAGNOSIS — B1910 Unspecified viral hepatitis B without hepatic coma: Secondary | ICD-10-CM

## 2011-03-18 DIAGNOSIS — O09299 Supervision of pregnancy with other poor reproductive or obstetric history, unspecified trimester: Secondary | ICD-10-CM

## 2011-03-18 DIAGNOSIS — O26619 Liver and biliary tract disorders in pregnancy, unspecified trimester: Secondary | ICD-10-CM

## 2011-03-18 DIAGNOSIS — Z862 Personal history of diseases of the blood and blood-forming organs and certain disorders involving the immune mechanism: Secondary | ICD-10-CM

## 2011-03-18 LAB — POCT URINALYSIS DIP (DEVICE)
Glucose, UA: NEGATIVE mg/dL
Ketones, ur: NEGATIVE mg/dL
Leukocytes, UA: NEGATIVE
Nitrite: NEGATIVE
Protein, ur: NEGATIVE mg/dL
pH: 7 (ref 5.0–8.0)

## 2011-03-18 MED ORDER — GLYBURIDE 2.5 MG PO TABS: 2.5000 mg | ORAL_TABLET | Freq: Every day | ORAL | Status: DC

## 2011-03-18 NOTE — Progress Notes (Signed)
Pulse 81. No vaginal discharge. Since started taking Keflex pt noticed has bumps on back and itching. Pt states last week after work right leg and back was hurting.

## 2011-03-18 NOTE — Progress Notes (Signed)
   Subjective:    Brenda Cannon is a 30 y.o. female being seen today for her obstetrical visit. She is at [redacted]w[redacted]d gestation. Patient reports backache. Fetal movement: not feeling yet.  Menstrual History: OB History    Grav Para Term Preterm Abortions TAB SAB Ect Mult Living   5 3 2 1 1  1  0 0 2      Menarche age: 30 Patient's last menstrual period was 11/24/2010.    The following portions of the patient's history were reviewed and updated as appropriate: allergies, current medications, past family history, past medical history, past social history, past surgical history and problem list.  Review of Systems Pertinent items are noted in HPI.   Objective:     BP 99/64  Temp 97.8 F (36.6 C)  Wt 180 lb 1.6 oz (81.693 kg)  LMP 11/24/2010 Uterine Size: 16 cm  Blood glucose fasting 75-90, post 111-176 Current Outpatient Prescriptions on File Prior to Visit  Medication Sig Dispense Refill  . Blood Glucose Monitoring Suppl (ACCU-CHEK NANO SMARTVIEW) W/DEVICE KIT 1 Device by Does not apply route once.  1 kit  0  . glucose blood (ACCU-CHEK INSTANT GLUCOSE TEST) test strip Use as instructed  100 each  12  . glucose blood test strip Use as instructed  100 each  12  . Lancets Misc. (ACCU-CHEK FASTCLIX LANCET) KIT 1 kit by Does not apply route 4 (four) times daily.  1 kit  0  . Prenatal Vit-Fe Fumarate-FA (PRENATAL PLUS) 65-1 MG TABS Take 1 tablet by mouth daily.          Assessment:    Pregnancy 16 and 2/7 weeks  Gest DM, Class B. Start Glyburide 2.5mg  daily Plan:    Problem list reviewed and updated. Labs reviewed. AFP3 discussed: undecided. Role of ultrasound in pregnancy discussed; fetal survey: ordered. RTC 1 week Amniocentesis discussed: not indicated. Follow up in 2 weeks. Marland Kitchen

## 2011-03-25 ENCOUNTER — Encounter: Payer: Medicaid Other | Attending: Obstetrics & Gynecology | Admitting: Dietician

## 2011-03-25 ENCOUNTER — Ambulatory Visit (INDEPENDENT_AMBULATORY_CARE_PROVIDER_SITE_OTHER): Payer: Medicaid Other | Admitting: Family Medicine

## 2011-03-25 DIAGNOSIS — Z713 Dietary counseling and surveillance: Secondary | ICD-10-CM | POA: Insufficient documentation

## 2011-03-25 DIAGNOSIS — O24419 Gestational diabetes mellitus in pregnancy, unspecified control: Secondary | ICD-10-CM

## 2011-03-25 DIAGNOSIS — O0990 Supervision of high risk pregnancy, unspecified, unspecified trimester: Secondary | ICD-10-CM

## 2011-03-25 DIAGNOSIS — O99810 Abnormal glucose complicating pregnancy: Secondary | ICD-10-CM

## 2011-03-25 LAB — POCT URINALYSIS DIP (DEVICE)
Protein, ur: NEGATIVE mg/dL
Specific Gravity, Urine: 1.02 (ref 1.005–1.030)
Urobilinogen, UA: 1 mg/dL (ref 0.0–1.0)

## 2011-03-25 NOTE — Progress Notes (Signed)
Addended by: Levie Heritage on: 03/25/2011 11:13 AM   Modules accepted: Orders

## 2011-03-25 NOTE — Patient Instructions (Signed)
Increase your glyburide to 2 tablets in AM and 1 tablet in PM

## 2011-03-25 NOTE — Progress Notes (Signed)
GDM - reviewed blood sugars with Idolina Primer, RN and with pt.  Pt was taking glyburide 2.5mg  BID  - will increase AM glyburide to 5mg .  Has ultrasound scheduled for 9/25.  F/u in 2weeks.

## 2011-03-25 NOTE — Progress Notes (Signed)
On checking blood glucose meter, the numbers in the log do not match her meter.  Notes she has a meter at home that she also uses.  Cautioned her that we want her to use one meter.  Fasting today is 89 and 2 hr post meal is at 163.  This might explain why she looks tired today.

## 2011-03-25 NOTE — Progress Notes (Signed)
Swelling in feet. No vaginal bleeding. Pt stopped taking Keflex because of severe itching and rash.

## 2011-04-02 ENCOUNTER — Ambulatory Visit (HOSPITAL_COMMUNITY)
Admission: RE | Admit: 2011-04-02 | Discharge: 2011-04-02 | Disposition: A | Discharge status: Home / Self Care | Payer: Medicaid Other | Source: Ambulatory Visit | Attending: Obstetrics & Gynecology | Admitting: Obstetrics & Gynecology

## 2011-04-02 DIAGNOSIS — Z363 Encounter for antenatal screening for malformations: Secondary | ICD-10-CM | POA: Insufficient documentation

## 2011-04-02 DIAGNOSIS — O358XX0 Maternal care for other (suspected) fetal abnormality and damage, not applicable or unspecified: Secondary | ICD-10-CM | POA: Insufficient documentation

## 2011-04-02 DIAGNOSIS — O99810 Abnormal glucose complicating pregnancy: Secondary | ICD-10-CM | POA: Insufficient documentation

## 2011-04-02 DIAGNOSIS — Z1389 Encounter for screening for other disorder: Secondary | ICD-10-CM | POA: Insufficient documentation

## 2011-04-02 DIAGNOSIS — Z8632 Personal history of gestational diabetes: Secondary | ICD-10-CM

## 2011-04-08 ENCOUNTER — Encounter: Payer: Self-pay | Admitting: Family Medicine

## 2011-04-08 ENCOUNTER — Ambulatory Visit (INDEPENDENT_AMBULATORY_CARE_PROVIDER_SITE_OTHER): Payer: Medicaid Other | Admitting: Family Medicine

## 2011-04-08 ENCOUNTER — Other Ambulatory Visit: Payer: Self-pay | Admitting: Obstetrics & Gynecology

## 2011-04-08 DIAGNOSIS — Z98891 History of uterine scar from previous surgery: Secondary | ICD-10-CM

## 2011-04-08 DIAGNOSIS — Z9889 Other specified postprocedural states: Secondary | ICD-10-CM

## 2011-04-08 DIAGNOSIS — Z23 Encounter for immunization: Secondary | ICD-10-CM

## 2011-04-08 DIAGNOSIS — Z8632 Personal history of gestational diabetes: Secondary | ICD-10-CM

## 2011-04-08 DIAGNOSIS — O24919 Unspecified diabetes mellitus in pregnancy, unspecified trimester: Secondary | ICD-10-CM

## 2011-04-08 LAB — POCT URINALYSIS DIP (DEVICE)
Glucose, UA: NEGATIVE mg/dL
Nitrite: NEGATIVE
Urobilinogen, UA: 0.2 mg/dL (ref 0.0–1.0)
pH: 6 (ref 5.0–8.0)

## 2011-04-08 MED ORDER — INFLUENZA VIRUS VACC SPLIT PF IM SUSP
0.5000 mL | Freq: Once | INTRAMUSCULAR | Status: AC
  Administered 2011-04-08: 0.5 mL via INTRAMUSCULAR

## 2011-04-08 MED ORDER — GLYBURIDE 2.5 MG PO TABS: 5.0000 mg | ORAL_TABLET | Freq: Two times a day (BID) | ORAL | Status: DC

## 2011-04-08 MED ORDER — HYDROXYZINE HCL 10 MG PO TABS: 10.0000 mg | ORAL_TABLET | Freq: Three times a day (TID) | ORAL | Status: AC | PRN

## 2011-04-08 NOTE — Assessment & Plan Note (Signed)
Arrest of dilatioin

## 2011-04-08 NOTE — Progress Notes (Signed)
[id number], 3 out of range 2hr pp-61-173, 7 out of range, increase glyburide to 2 bid (5 mg) twice daily---must eat 3 meals, 3 snacks. Re-reviewed diet. Subjective:    Brenda Cannon is a 30 y.o. [id number] [redacted]w[redacted]d being seen today for her obstetrical visit.  Patient reports no complaints. Fetal movement: normal.  Objective:    BP 92/66  Pulse 73  Temp 98.2 F (36.8 C)  Wt 183 lb 3.2 oz (83.099 kg)  LMP 11/24/2010  Physical Exam  Exam  FHT:  140 BPM  Uterine Size: size equals dates  Presentation: unsure     Assessment:    Pregnancy:  [id number]    Plan:    Patient Active Problem List  Diagnoses  . Diabetes mellitus, antepartum  . Hepatitis B infection  . H/O: C-section  . History of thrombocytopenia  . History of malaria    see note, needs rx for itching skin, re-reviewed diet, increase meds. Follow up in 2 Weeks.

## 2011-04-08 NOTE — Progress Notes (Signed)
Edema: feet Pain: lower back, pelvic, vagina

## 2011-04-08 NOTE — Patient Instructions (Addendum)
Following an appropriate diet is the most important thin to do for your health and that of your unborn baby. Please keep accurate BS logs and bring them with you to every visit.  Gestational Diabetes Mellitus (GDM) Gestational diabetes mellitus (GDM) is diabetes that occurs only during pregnancy. This happens when the body cannot properly handle the glucose (sugar) that increases in the blood after eating. During pregnancy, insulin resistance (reduced sensitivity to insulin) occurs because of the release of hormones from the placenta. Usually, the pancreas of pregnant women produces enough insulin to overcome the resistance that occurs. However, in gestational diabetes, the insulin is there but it does not work effectively. If the resistance is severe enough that the pancreas does not produce enough insulin, extra glucose builds up in the blood.  WHO IS AT RISK FOR DEVELOPING GESTATIONAL DIABETES?  Women with a history of diabetes in the family.   Women over age 40.   Women who are overweight.   Women in certain ethnic groups (Hispanic, African American, Native American, [place] and [place] Islander).  WHAT CAN HAPPEN TO THE BABY? If the mother's blood glucose is too high while she is pregnant, the extra sugar will travel through the umbilical cord to the baby. Some of the problems the baby may have are:  Large Baby - If the baby receives too much sugar, the baby will gain more weight. This may cause the baby to be too large to be born normally (vaginally) and a Cesarean section (C-section) may be needed.   Low Blood Glucose (hypoglycemia) - The baby makes extra insulin, in response to the extra sugar its gets from its mother. When the baby is born and no longer needs this extra insulin, the baby's blood glucose level may drop.   Jaundice (yellow coloring of the skin and eyes) - This is fairly common in babies. It is caused from a build-up of the chemical called bilirubin. This is rarely serious,  but is seen more often in babies whose mothers had gestational diabetes.  RISKS TO THE MOTHER Women who have had gestational diabetes may be at higher risk for some problems, including:  Preeclampsia or toxemia, which includes problems with high blood pressure. Blood pressure and protein levels in the urine must be checked frequently.   Infections.   Cesarean section (C-section) for delivery.   Developing Type 2 diabetes later in life. About 30-50% will develop diabetes later, especially if obese.  DIAGNOSIS The hormones that cause insulin resistance are highest at about 24-28 weeks of pregnancy. If symptoms are experienced, they are much like symptoms you would normally expect during pregnancy.  GDM is often diagnosed using a two part method: 1. After 24-28 weeks of pregnancy, the woman drinks a glucose solution and takes a blood test. If the glucose level is high, a second test will be given.  2. Oral Glucose Tolerance Test (OGTT) which is 3 hours long - After not eating overnight, the blood glucose is checked. The woman drinks a glucose solution, and hourly blood glucose tests are taken.  If the woman has risk factors for GDM, the caregiver may test earlier than 24 weeks of pregnancy. TREATMENT Treatment of GDM is directed at keeping the mother's blood glucose level normal, and may include:  Meal planning.   Taking insulin or other medicine to control your blood glucose level.   Exercise.   Keeping a daily record of the foods you eat.   Blood glucose monitoring and keeping a record of  your blood glucose levels.   May monitor ketone levels in the urine, although this is no longer considered necessary in most pregnancies.  HOME CARE INSTRUCTIONS While you are pregnant:  Follow your caregiver's advice regarding your prenatal appointments, meal planning, exercise, medicines, vitamins, blood and other tests, and physical activities.   Keep a record of your meals, blood glucose  tests, and the amount of insulin you are taking (if any). Show this to your caregiver at every prenatal visit.   If you have GDM, you may have problems with hypoglycemia (low blood glucose). You may suspect this if you become suddenly dizzy, feel shaky, and/or weak. If you think this is happening and you have a glucose meter, try to test your blood glucose level. Follow your caregiver's advice for when and how to treat your low blood glucose. Generally, the 15:15 rule is followed: Treat by consuming 15 grams of carbohydrates, wait 15 minutes, and recheck blood glucose. Examples of 15 grams of carbohydrates are:   1 cup skim or low fat milk.    cup juice.   3-4 glucose tablets.   5-6 hard candies.   1 small box raisins.    cup regular soda pop.   Practice good hygiene, to avoid infections.   Do not smoke.  SEEK MEDICAL CARE IF:  You develop abnormal vaginal discharge, with or without itching.   You become weak and tired more than expected.   You seem to sweat a lot.   You have a sudden increase in weight, 5 pounds or more in one week.   You are losing weight, 3 pounds or more in a week.   Your blood glucose level is high, and you need instructions on what to do about it.  SEEK IMMEDIATE MEDICAL CARE IF:  You develop a severe headache.   You faint or pass out.   You develop nausea and vomiting.   You become disoriented or confused.   You have a convulsion.   You develop vision problems.   You develop stomach pain.   You develop vaginal bleeding.   You develop uterine contractions.   You have leaking or a gush of fluid from the vagina.  AFTER YOU HAVE THE BABY:  Go to all of your follow-up appointments, and have blood tests as advised by your caregiver.   Maintain a healthy lifestyle, to prevent diabetes in the future. This includes:   Following a healthy meal plan.   Controlling your weight.   Getting enough exercise and proper rest.   Do not smoke.     Breastfeed your baby if you can. This will lower the chance of you and your baby developing diabetes later in life.  For more information about diabetes, go to the American Diabetes Association at: PMFashions.com.cy. For more information about gestational diabetes, go to the [employer] at: RentRule.com.au. Document Released: 09/30/2000 Document Re-Released: 06/12/2009 [facility] Patient Information 2011 [place], [place].

## 2011-04-13 ENCOUNTER — Encounter (HOSPITAL_COMMUNITY): Payer: Self-pay | Admitting: Obstetrics and Gynecology

## 2011-04-13 ENCOUNTER — Inpatient Hospital Stay (HOSPITAL_COMMUNITY)
Admission: AD | Admit: 2011-04-13 | Discharge: 2011-04-13 | Disposition: A | Discharge status: Home / Self Care | Payer: Medicaid Other | Source: Ambulatory Visit | Attending: Obstetrics and Gynecology | Admitting: Obstetrics and Gynecology

## 2011-04-13 DIAGNOSIS — O99891 Other specified diseases and conditions complicating pregnancy: Secondary | ICD-10-CM | POA: Insufficient documentation

## 2011-04-13 DIAGNOSIS — K5289 Other specified noninfective gastroenteritis and colitis: Secondary | ICD-10-CM | POA: Insufficient documentation

## 2011-04-13 DIAGNOSIS — O210 Mild hyperemesis gravidarum: Secondary | ICD-10-CM | POA: Insufficient documentation

## 2011-04-13 LAB — COMPREHENSIVE METABOLIC PANEL
AST: 38 U/L — ABNORMAL HIGH (ref 0–37)
Albumin: 2.8 g/dL — ABNORMAL LOW (ref 3.5–5.2)
BUN: 7 mg/dL (ref 6–23)
Calcium: 8.7 mg/dL (ref 8.4–10.5)
Chloride: 105 mEq/L (ref 96–112)
Creatinine, Ser: <0.47 mg/dL — ABNORMAL LOW (ref 0.50–1.10)
Total Bilirubin: 0.5 mg/dL (ref 0.3–1.2)

## 2011-04-13 LAB — URINE MICROSCOPIC-ADD ON

## 2011-04-13 LAB — URINALYSIS, ROUTINE W REFLEX MICROSCOPIC
Bilirubin Urine: NEGATIVE
Glucose, UA: NEGATIVE mg/dL
Ketones, ur: NEGATIVE mg/dL
Protein, ur: NEGATIVE mg/dL
Urobilinogen, UA: 0.2 mg/dL (ref 0.0–1.0)

## 2011-04-13 LAB — CBC
HCT: 33.3 % — ABNORMAL LOW (ref 36.0–46.0)
MCH: 28.6 pg (ref 26.0–34.0)
MCHC: 33 g/dL (ref 30.0–36.0)
MCV: 86.7 fL (ref 78.0–100.0)
Platelets: 125 10*3/uL — ABNORMAL LOW (ref 150–400)
RDW: 14.8 % (ref 11.5–15.5)

## 2011-04-13 MED ORDER — ONDANSETRON HCL 4 MG PO TABS: 4.0000 mg | ORAL_TABLET | Freq: Three times a day (TID) | ORAL | Status: AC | PRN

## 2011-04-13 MED ORDER — SODIUM CHLORIDE 0.9 % IV SOLN
INTRAVENOUS | Status: DC
  Administered 2011-04-13: 05:00:00 via INTRAVENOUS

## 2011-04-13 MED ORDER — SODIUM CHLORIDE 0.9 % IV BOLUS (SEPSIS)
500.0000 mL | Freq: Once | INTRAVENOUS | Status: AC
  Administered 2011-04-13: 500 mL via INTRAVENOUS

## 2011-04-13 MED ORDER — ONDANSETRON HCL 4 MG/2ML IJ SOLN: 4.0000 mg | Freq: Once | INTRAMUSCULAR | Status: DC

## 2011-04-13 NOTE — Progress Notes (Signed)
Pt states, " I started having vomiting and diarrhea at 9 pm. I've thrown up many times, and had five diarrhea stools along with cramping in by bottom of my stomach."

## 2011-04-13 NOTE — Progress Notes (Signed)
As of 7 am the patient has not vomited since arrival, states that she is hungry, No Diarrhea.  She has received a liter of IV fluids, and admission Specific Gravity on CCUA is 1.010 with no ketones. PEX;  Abdomen soft, gravid nontender uterus.   Imp Twin Pregnancy 20.0wk         Gastroenteritis  Plan:  Complete current IV fluids            Zofran 4 mg IV            D/c home             Routine followup [facility]

## 2011-04-13 NOTE — ED Provider Notes (Signed)
History     Chief Complaint  Patient presents with   Emesis   Diarrhea   Emesis  Associated symptoms include diarrhea.  Diarrhea  Associated symptoms include vomiting.   Patient is a [id number] at 20/0 GA who presents to the MAU complaining of multiple episodes of vomiting and diarrhea that started around 9 PM on 04/12/11. She has no known sick contacts. No fever. She reports she has eaten only wheat bread and 2 hard boiled eggs in the past 24 hours. She has associated abdominal pain while vomiting. Last episode occurred at 1:30 AM. Now feeling weak and fatigued. Her last prenatal visit was Monday, 04/08/11, with normal fetal heart tones. She is currently being treated with Glyburide for gestational diabetes. She is not having contractions, but is worried because she has not felt any fetal movements since she started vomiting. Denies pelvic/vaginal pain, bleeding, discharge. No urinary symptoms, hematochezia, or hematemesis.  OB History    Grav Para Term Preterm Abortions TAB SAB Ect Mult Living   5 2 2  0 2  1 0 0 2      Past Medical History  Diagnosis Date   Gestational diabetes     GDM with last pregnancy (2008)    Past Surgical History  Procedure Date   Cesarean section 2008    last pregnacy    Family History  Problem Relation Age of Onset   Hypertension Mother     History  Substance Use Topics   Smoking status: Never Smoker    Smokeless tobacco: Not on file   Alcohol Use: No    Allergies:  Allergies  Allergen Reactions   Chloroquine Itching   Keflex Itching and Rash    Prescriptions prior to admission  Medication Sig Dispense Refill   glyBURIDE (DIABETA) 2.5 MG tablet Take 7.5 mg by mouth daily. Take 2 tablets in morning and 1 tablet at bedtime.        hydrOXYzine (ATARAX/VISTARIL) 10 MG tablet Take 1 tablet (10 mg total) by mouth 3 (three) times daily as needed for itching.  30 tablet  0   prenatal vitamin w/FE, FA (PRENATAL 1 + 1) 27-1 MG TABS  Take 1 tablet by mouth daily.         Blood Glucose Monitoring Suppl (ACCU-CHEK NANO SMARTVIEW) W/DEVICE KIT 1 Device by Does not apply route once.  1 kit  0   glucose blood (ACCU-CHEK INSTANT GLUCOSE TEST) test strip Use as instructed  100 each  12   glucose blood test strip Use as instructed  100 each  12   Lancets Misc. (ACCU-CHEK FASTCLIX LANCET) KIT 1 kit by Does not apply route 4 (four) times daily.  1 kit  0   Prenatal Vit-Fe Fumarate-FA (PRENATAL PLUS) 65-1 MG TABS Take 1 tablet by mouth daily.         DISCONTD: glyBURIDE (DIABETA) 2.5 MG tablet Take 2 tablets (5 mg total) by mouth 2 (two) times daily with a meal. 2 tablets in AM, 1 tablet in at bedtime.  60 tablet  3    Review of Systems  Gastrointestinal: Positive for vomiting and diarrhea.  All other systems reviewed and are negative.   Physical Exam   Blood pressure 116/68, pulse 90, temperature 98.8 F (37.1 C), temperature source Oral, resp. rate 20, height 4' 11.75" (1.518 m), weight 83.519 kg (184 lb 2 oz), last menstrual period 11/24/2010.  Physical Exam  Nursing note and vitals reviewed. Constitutional: She is oriented to person, place, and  time. She appears well-developed and well-nourished. No distress.  HENT:  Head: Normocephalic and atraumatic.  Neck: Neck supple.  Cardiovascular: Normal rate, regular rhythm, normal heart sounds and intact distal pulses.  Exam reveals no gallop and no friction rub.   No murmur heard. GI: Soft. Bowel sounds are normal. There is tenderness (mild epigastric and periumbilical tenderness to palpation). There is no rebound and no guarding.       Gravid  Neurological: She is alert and oriented to person, place, and time.  Skin: Skin is warm and dry. She is not diaphoretic.  Psychiatric: She has a normal mood and affect. Her behavior is normal.    MAU Course  Procedures  Results for orders placed during the hospital encounter of 04/13/11 (from the past 24 hour(s))    URINALYSIS, ROUTINE W REFLEX MICROSCOPIC     Status: Abnormal   Collection Time   04/13/11  3:48 AM      Component Value Range   Color, Urine YELLOW  YELLOW    Appearance CLEAR  CLEAR    Specific Gravity, Urine 1.010  1.005 - 1.030    pH 6.0  5.0 - 8.0    Glucose, UA NEGATIVE  NEGATIVE (mg/dL)   Hgb urine dipstick MODERATE (*) NEGATIVE    Bilirubin Urine NEGATIVE  NEGATIVE    Ketones, ur NEGATIVE  NEGATIVE (mg/dL)   Protein, ur NEGATIVE  NEGATIVE (mg/dL)   Urobilinogen, UA 0.2  0.0 - 1.0 (mg/dL)   Nitrite NEGATIVE  NEGATIVE    Leukocytes, UA NEGATIVE  NEGATIVE   URINE MICROSCOPIC-ADD ON     Status: Abnormal   Collection Time   04/13/11  3:48 AM      Component Value Range   Squamous Epithelial / LPF RARE  RARE    WBC, UA 0-2  <3 (WBC/hpf)   RBC / HPF 3-6  <3 (RBC/hpf)   Bacteria, UA FEW (*) RARE   CBC     Status: Abnormal   Collection Time   04/13/11  5:15 AM      Component Value Range   WBC 6.3  4.0 - 10.5 (K/uL)   RBC 3.84 (*) 3.87 - 5.11 (MIL/uL)   Hemoglobin 11.0 (*) 12.0 - 15.0 (g/dL)   HCT 40.9 (*) 81.1 - 46.0 (%)   MCV 86.7  78.0 - 100.0 (fL)   MCH 28.6  26.0 - 34.0 (pg)   MCHC 33.0  30.0 - 36.0 (g/dL)   RDW 91.4  78.2 - 95.6 (%)   Platelets 125 (*) 150 - 400 (K/uL)  COMPREHENSIVE METABOLIC PANEL     Status: Abnormal   Collection Time   04/13/11  5:15 AM      Component Value Range   Sodium 136  135 - 145 (mEq/L)   Potassium 3.1 (*) 3.5 - 5.1 (mEq/L)   Chloride 105  96 - 112 (mEq/L)   CO2 23  19 - 32 (mEq/L)   Glucose, Bld 137 (*) 70 - 99 (mg/dL)   BUN 7  6 - 23 (mg/dL)   Creatinine, Ser <2.13 (*) 0.50 - 1.10 (mg/dL)   Calcium 8.7  8.4 - 08.6 (mg/dL)   Total Protein 6.2  6.0 - 8.3 (g/dL)   Albumin 2.8 (*) 3.5 - 5.2 (g/dL)   AST 38 (*) 0 - 37 (U/L)   ALT 34  0 - 35 (U/L)   Alkaline Phosphatase 74  39 - 117 (U/L)   Total Bilirubin 0.5  0.3 - 1.2 (mg/dL)   GFR  calc non Af Amer NOT CALCULATED  >90 (mL/min)   GFR calc Af Amer NOT CALCULATED  >90 (mL/min)      Assessment and Plan  Assessment: 1. [id number] at 20/0 GA with gastroenteritis  Plan: 1. Start NS IV bolus followed by NS IV drip 2. CBC w/ Diff 3. CMP 4. Bedside Pelvic US 5. Potassium 40 MEQ PO x 1 6. Discharge home with regular follow up at OB/GYN clinic. 7. Return to MAU as needed or if symptoms recur/worsen   Janeece Riggers 04/13/2011, 4:16 AM

## 2011-04-15 ENCOUNTER — Ambulatory Visit (INDEPENDENT_AMBULATORY_CARE_PROVIDER_SITE_OTHER): Payer: Medicaid Other | Admitting: Family Medicine

## 2011-04-15 DIAGNOSIS — O24919 Unspecified diabetes mellitus in pregnancy, unspecified trimester: Secondary | ICD-10-CM

## 2011-04-15 DIAGNOSIS — O0990 Supervision of high risk pregnancy, unspecified, unspecified trimester: Secondary | ICD-10-CM | POA: Insufficient documentation

## 2011-04-15 DIAGNOSIS — G5600 Carpal tunnel syndrome, unspecified upper limb: Secondary | ICD-10-CM

## 2011-04-15 DIAGNOSIS — G5602 Carpal tunnel syndrome, left upper limb: Secondary | ICD-10-CM

## 2011-04-15 DIAGNOSIS — Z98891 History of uterine scar from previous surgery: Secondary | ICD-10-CM

## 2011-04-15 DIAGNOSIS — Z9889 Other specified postprocedural states: Secondary | ICD-10-CM

## 2011-04-15 LAB — POCT URINALYSIS DIP (DEVICE)
Bilirubin Urine: NEGATIVE
Glucose, UA: 100 mg/dL — AB
Leukocytes, UA: NEGATIVE
Nitrite: NEGATIVE
Urobilinogen, UA: 1 mg/dL (ref 0.0–1.0)
pH: 7 (ref 5.0–8.0)

## 2011-04-15 NOTE — Progress Notes (Signed)
Edema: feet C/O numbness on left hand especially at night.

## 2011-04-15 NOTE — Progress Notes (Signed)
GDM - fasting blood sugar < 90 with one exception.  2hr postparandial 3-4 > 120.  Has been changing diet, eliminating rice, pasta, etc. US done 9/25 - incomplete spine.  Will order repeat US. Numbness in left hand at night.  Improves when wakes.  No weakness.  Neurovascularly intact.  Carpal tunnel in pregnancy.  Discussed bracing. RTC 2 weeks.

## 2011-04-18 LAB — I-STAT 8, (EC8 V) (CONVERTED LAB)
Acid-base deficit: 1
Bicarbonate: 23.5
HCT: 42
Operator id: 257131
Sodium: 139
TCO2: 25
pCO2, Ven: 36.3 — ABNORMAL LOW

## 2011-04-18 LAB — URINALYSIS, ROUTINE W REFLEX MICROSCOPIC
Hgb urine dipstick: NEGATIVE
Nitrite: NEGATIVE
Protein, ur: NEGATIVE
Urobilinogen, UA: 1

## 2011-04-18 LAB — DIFFERENTIAL
Basophils Absolute: 0
Basophils Relative: 0
Lymphocytes Relative: 38
Neutro Abs: 2.5
Neutrophils Relative %: 53

## 2011-04-18 LAB — CBC
MCHC: 33.6
RBC: 4.31
RDW: 13.6

## 2011-04-18 LAB — GC/CHLAMYDIA PROBE AMP, GENITAL
Chlamydia, DNA Probe: NEGATIVE
GC Probe Amp, Genital: NEGATIVE

## 2011-04-18 LAB — PREGNANCY, URINE: Preg Test, Ur: NEGATIVE

## 2011-04-18 LAB — RPR: RPR Ser Ql: NONREACTIVE

## 2011-04-18 LAB — POCT I-STAT CREATININE: Creatinine, Ser: 0.8

## 2011-04-19 ENCOUNTER — Encounter (HOSPITAL_COMMUNITY): Payer: Self-pay | Admitting: *Deleted

## 2011-04-19 ENCOUNTER — Inpatient Hospital Stay (HOSPITAL_COMMUNITY)
Admission: AD | Admit: 2011-04-19 | Discharge: 2011-04-19 | Disposition: A | Discharge status: Home / Self Care | Payer: Medicaid Other | Source: Ambulatory Visit | Attending: Obstetrics and Gynecology | Admitting: Obstetrics and Gynecology

## 2011-04-19 ENCOUNTER — Telehealth: Payer: Self-pay | Admitting: *Deleted

## 2011-04-19 DIAGNOSIS — Z862 Personal history of diseases of the blood and blood-forming organs and certain disorders involving the immune mechanism: Secondary | ICD-10-CM

## 2011-04-19 DIAGNOSIS — M549 Dorsalgia, unspecified: Secondary | ICD-10-CM

## 2011-04-19 DIAGNOSIS — O99891 Other specified diseases and conditions complicating pregnancy: Secondary | ICD-10-CM | POA: Insufficient documentation

## 2011-04-19 DIAGNOSIS — O24919 Unspecified diabetes mellitus in pregnancy, unspecified trimester: Secondary | ICD-10-CM

## 2011-04-19 DIAGNOSIS — B1910 Unspecified viral hepatitis B without hepatic coma: Secondary | ICD-10-CM

## 2011-04-19 DIAGNOSIS — E119 Type 2 diabetes mellitus without complications: Secondary | ICD-10-CM | POA: Insufficient documentation

## 2011-04-19 DIAGNOSIS — O09299 Supervision of pregnancy with other poor reproductive or obstetric history, unspecified trimester: Secondary | ICD-10-CM

## 2011-04-19 DIAGNOSIS — Z98891 History of uterine scar from previous surgery: Secondary | ICD-10-CM

## 2011-04-19 DIAGNOSIS — O0990 Supervision of high risk pregnancy, unspecified, unspecified trimester: Secondary | ICD-10-CM

## 2011-04-19 LAB — URINE MICROSCOPIC-ADD ON

## 2011-04-19 LAB — URINALYSIS, ROUTINE W REFLEX MICROSCOPIC
Bilirubin Urine: NEGATIVE
Ketones, ur: NEGATIVE mg/dL
Nitrite: NEGATIVE
pH: 6.5 (ref 5.0–8.0)

## 2011-04-19 MED ORDER — ACETAMINOPHEN 325 MG PO TABS
650.0000 mg | ORAL_TABLET | Freq: Once | ORAL | Status: AC
  Administered 2011-04-19: 650 mg via ORAL
  Filled 2011-04-19: qty 2

## 2011-04-19 MED ORDER — CYCLOBENZAPRINE HCL 10 MG PO TABS: 10.0000 mg | ORAL_TABLET | Freq: Once | ORAL | Status: AC

## 2011-04-19 MED ORDER — ACETAMINOPHEN 325 MG PO TABS: 650.0000 mg | ORAL_TABLET | Freq: Once | ORAL | Status: AC

## 2011-04-19 MED ORDER — CYCLOBENZAPRINE HCL 10 MG PO TABS
10.0000 mg | ORAL_TABLET | Freq: Once | ORAL | Status: AC
Start: 2011-04-19 — End: 2011-04-19
  Administered 2011-04-19: 10 mg via ORAL
  Filled 2011-04-19: qty 1

## 2011-04-19 NOTE — ED Provider Notes (Addendum)
History     Chief Complaint  Patient presents with  . Back Pain   HPI This is a 30 year old G5 P2 022 who is 20 weeks and 5 days who presents to the MAU with aching back pain that started last night she rates as 9/10 last night and currently 8/10. The pain is nonradiating and is located in the lower back. She has not had anything for her pain. She has a history of back pain after an injury in April. She was treated for this and had been pain free for several months. She denies muscle weakness, numbness, paresthesias. She denies injury to her back.  OB History    Grav Para Term Preterm Abortions TAB SAB Ect Mult Living   5 2 2  0 2  1 0 0 2      Past Medical History  Diagnosis Date  . Gestational diabetes     GDM with last pregnancy (2008)    Past Surgical History  Procedure Date  . Cesarean section 2008    last pregnacy    Family History  Problem Relation Age of Onset  . Hypertension Mother     History  Substance Use Topics  . Smoking status: Never Smoker   . Smokeless tobacco: Not on file  . Alcohol Use: No    Allergies:  Allergies  Allergen Reactions  . Chloroquine Itching  . Keflex Itching and Rash    Prescriptions prior to admission  Medication Sig Dispense Refill  . glyBURIDE (DIABETA) 2.5 MG tablet Take 2.5 mg by mouth 2 (two) times daily. Take 1 tablet in morning and 1 tablet at bedtime.      . ondansetron (ZOFRAN) 4 MG tablet Take 1 tablet (4 mg total) by mouth every 8 (eight) hours as needed for nausea.  20 tablet  0  . prenatal vitamin w/FE, FA (PRENATAL 1 + 1) 27-1 MG TABS Take 1 tablet by mouth daily.        . Blood Glucose Monitoring Suppl (ACCU-CHEK NANO SMARTVIEW) W/DEVICE KIT 1 Device by Does not apply route once.  1 kit  0  . glucose blood (ACCU-CHEK INSTANT GLUCOSE TEST) test strip Use as instructed  100 each  12  . glucose blood test strip Use as instructed  100 each  12  . hydrOXYzine (ATARAX/VISTARIL) 10 MG tablet Take 1 tablet (10 mg total)  by mouth 3 (three) times daily as needed for itching.  30 tablet  0  . Lancets Misc. (ACCU-CHEK FASTCLIX LANCET) KIT 1 kit by Does not apply route 4 (four) times daily.  1 kit  0  . Prenatal Vit-Fe Fumarate-FA (PRENATAL PLUS) 65-1 MG TABS Take 1 tablet by mouth daily.          Review of Systems  Constitutional: Negative for fever and chills.  Gastrointestinal: Negative for nausea, vomiting, abdominal pain, diarrhea and constipation.  Genitourinary: Negative for dysuria, urgency, frequency and hematuria.       She denies contractions, loss of fluid, vaginal discharge.  Musculoskeletal: Negative for myalgias.  Neurological: Negative for weakness.   Physical Exam   Blood pressure 103/54, pulse 89, temperature 98.6 F (37 C), temperature source Oral, resp. rate 20, height 4\' 11"  (1.499 m), weight 82.101 kg (181 lb), last menstrual period 11/24/2010, SpO2 99.00%.  Physical Exam  Constitutional: She is oriented to person, place, and time. She appears well-developed and well-nourished.  HENT:  Head: Normocephalic and atraumatic.  GI: Soft. Bowel sounds are normal. She exhibits no distension  and no mass. There is no tenderness. There is no rebound and no guarding.       Fundal height is approximately to the umbilicus.  Musculoskeletal:       Tenderness to palpation approximately L3-4. Strength is 5/5 in the lower extremities bilaterally. Range of motion is normal. Gait is normal.  Neurological: She is alert and oriented to person, place, and time. She displays normal reflexes. She exhibits normal muscle tone.  Skin: Skin is warm and dry.   Fetal heart Doppler shows a heart rate of 151 MAU Course  Procedures  Assessment and Plan  #1 intrauterine pregnancy at 20 weeks and 6 days #2 lumbar back pain #3 diabetes mellitus  The etiology of the patient's lower back pain is musculoskeletal in nature. Will prescribe the patient Flexeril 10 mg 3 times a day when necessary pain. I did discuss with  the patient that this medication may be sedating and should not drive after taking it. I also recommended Tylenol for pain. I recommended using heat to the area for 15 minutes 3 to 4 times a day. I showed her back exercises to perform twice a day. The chest the patient to followup at the lower risk clinic at her next appointment.  Taequan Stockhausen JEHIEL 04/19/2011, 11:07 AM

## 2011-04-19 NOTE — Telephone Encounter (Signed)
Pt called upset and emotional states was on feet all night at work. Pt has vaginal bleeding this am and baby has not moved since last night. Pt states baby is usually moving. Advised pt to go to MAU for evaluation.

## 2011-04-19 NOTE — ED Notes (Signed)
Asleep when entered rm

## 2011-04-19 NOTE — Progress Notes (Signed)
Pt states she started having low back pain last night that is constant. This morning had dark red blood on the tissue with wiping after urinating. Not wearing a pad and no active bleeding. Pt states she has not felt the baby move today. FHR in triage in the 150's with audible movement.

## 2011-04-22 ENCOUNTER — Ambulatory Visit (HOSPITAL_COMMUNITY)
Admission: RE | Admit: 2011-04-22 | Discharge: 2011-04-22 | Disposition: A | Discharge status: Home / Self Care | Payer: Medicaid Other | Source: Ambulatory Visit | Attending: Family Medicine | Admitting: Family Medicine

## 2011-04-22 ENCOUNTER — Ambulatory Visit (INDEPENDENT_AMBULATORY_CARE_PROVIDER_SITE_OTHER): Payer: Medicaid Other | Admitting: Family Medicine

## 2011-04-22 VITALS — BP 98/74 | Temp 97.4°F | Wt 182.4 lb

## 2011-04-22 DIAGNOSIS — O24919 Unspecified diabetes mellitus in pregnancy, unspecified trimester: Secondary | ICD-10-CM

## 2011-04-22 DIAGNOSIS — O99810 Abnormal glucose complicating pregnancy: Secondary | ICD-10-CM | POA: Insufficient documentation

## 2011-04-22 LAB — POCT URINALYSIS DIP (DEVICE)
Leukocytes, UA: NEGATIVE
Nitrite: NEGATIVE
Protein, ur: NEGATIVE mg/dL
Urobilinogen, UA: 1 mg/dL (ref 0.0–1.0)

## 2011-04-22 MED ORDER — ACCU-CHEK FASTCLIX LANCET KIT: 1.0000 | PACK | Freq: Four times a day (QID) | Status: DC

## 2011-04-22 NOTE — Progress Notes (Signed)
P=106                        , taking flexeril BID,c/o trace edema in feet,

## 2011-04-22 NOTE — Progress Notes (Signed)
FBS 73-104 4/7 out of range 2hr pp 50-135 2 out of range U/S today 60% Subjective:    Brenda Cannon is a 30 y.o. [id number] [redacted]w[redacted]d being seen today for her obstetrical visit.  Patient reports no complaints. Fetal movement: normal.  Objective:    BP 98/74  Temp 97.4 F (36.3 C)  Wt 182 lb 6.4 oz (82.736 kg)  LMP 11/24/2010  Physical Exam  Exam  FHT:  140 BPM  Uterine Size: 23 cm  Presentation: unsure     Assessment:    Pregnancy:  [id number]    Plan:    Patient Active Problem List  Diagnoses  . Diabetes mellitus, antepartum  . Hepatitis B infection  . H/O: C-section  . History of thrombocytopenia  . History of malaria  . High-risk pregnancy supervision  . Carpal tunnel syndrome of left wrist    Watch fasting BS may need increase in Glyburide at [facility]. Follow up in 2 Weeks.

## 2011-04-22 NOTE — Patient Instructions (Signed)
Gestational Diabetes Mellitus (GDM) Gestational diabetes mellitus (GDM) is diabetes that occurs only during pregnancy. This happens when the body cannot properly handle the glucose (sugar) that increases in the blood after eating. During pregnancy, insulin resistance (reduced sensitivity to insulin) occurs because of the release of hormones from the placenta. Usually, the pancreas of pregnant women produces enough insulin to overcome the resistance that occurs. However, in gestational diabetes, the insulin is there but it does not work effectively. If the resistance is severe enough that the pancreas does not produce enough insulin, extra glucose builds up in the blood.  WHO IS AT RISK FOR DEVELOPING GESTATIONAL DIABETES?  Women with a history of diabetes in the family.   Women over age 25.   Women who are overweight.   Women in certain ethnic groups (Hispanic, African American, Native American, Asian and Pacific Islander).  WHAT CAN HAPPEN TO THE BABY? If the mother's blood glucose is too high while she is pregnant, the extra sugar will travel through the umbilical cord to the baby. Some of the problems the baby may have are:  Large Baby - If the baby receives too much sugar, the baby will gain more weight. This may cause the baby to be too large to be born normally (vaginally) and a Cesarean section (C-section) may be needed.   Low Blood Glucose (hypoglycemia) - The baby makes extra insulin, in response to the extra sugar its gets from its mother. When the baby is born and no longer needs this extra insulin, the baby's blood glucose level may drop.   Jaundice (yellow coloring of the skin and eyes) - This is fairly common in babies. It is caused from a build-up of the chemical called bilirubin. This is rarely serious, but is seen more often in babies whose mothers had gestational diabetes.  RISKS TO THE MOTHER Women who have had gestational diabetes may be at higher risk for some problems,  including:  Preeclampsia or toxemia, which includes problems with high blood pressure. Blood pressure and protein levels in the urine must be checked frequently.   Infections.   Cesarean section (C-section) for delivery.   Developing Type 2 diabetes later in life. About 30-50% will develop diabetes later, especially if obese.  DIAGNOSIS The hormones that cause insulin resistance are highest at about 24-28 weeks of pregnancy. If symptoms are experienced, they are much like symptoms you would normally expect during pregnancy.  GDM is often diagnosed using a two part method: 1. After 24-28 weeks of pregnancy, the woman drinks a glucose solution and takes a blood test. If the glucose level is high, a second test will be given.  2. Oral Glucose Tolerance Test (OGTT) which is 3 hours long - After not eating overnight, the blood glucose is checked. The woman drinks a glucose solution, and hourly blood glucose tests are taken.  If the woman has risk factors for GDM, the caregiver may test earlier than 24 weeks of pregnancy. TREATMENT Treatment of GDM is directed at keeping the mother's blood glucose level normal, and may include:  Meal planning.   Taking insulin or other medicine to control your blood glucose level.   Exercise.   Keeping a daily record of the foods you eat.   Blood glucose monitoring and keeping a record of your blood glucose levels.   May monitor ketone levels in the urine, although this is no longer considered necessary in most pregnancies.  HOME CARE INSTRUCTIONS While you are pregnant:  Follow your   caregiver's advice regarding your prenatal appointments, meal planning, exercise, medicines, vitamins, blood and other tests, and physical activities.   Keep a record of your meals, blood glucose tests, and the amount of insulin you are taking (if any). Show this to your caregiver at every prenatal visit.   If you have GDM, you may have problems with hypoglycemia (low blood  glucose). You may suspect this if you become suddenly dizzy, feel shaky, and/or weak. If you think this is happening and you have a glucose meter, try to test your blood glucose level. Follow your caregiver's advice for when and how to treat your low blood glucose. Generally, the 15:15 rule is followed: Treat by consuming 15 grams of carbohydrates, wait 15 minutes, and recheck blood glucose. Examples of 15 grams of carbohydrates are:   1 cup skim or low fat milk.    cup juice.   3-4 glucose tablets.   5-6 hard candies.   1 small box raisins.    cup regular soda pop.   Practice good hygiene, to avoid infections.   Do not smoke.  SEEK MEDICAL CARE IF:  You develop abnormal vaginal discharge, with or without itching.   You become weak and tired more than expected.   You seem to sweat a lot.   You have a sudden increase in weight, 5 pounds or more in one week.   You are losing weight, 3 pounds or more in a week.   Your blood glucose level is high, and you need instructions on what to do about it.  SEEK IMMEDIATE MEDICAL CARE IF:  You develop a severe headache.   You faint or pass out.   You develop nausea and vomiting.   You become disoriented or confused.   You have a convulsion.   You develop vision problems.   You develop stomach pain.   You develop vaginal bleeding.   You develop uterine contractions.   You have leaking or a gush of fluid from the vagina.  AFTER YOU HAVE THE BABY:  Go to all of your follow-up appointments, and have blood tests as advised by your caregiver.   Maintain a healthy lifestyle, to prevent diabetes in the future. This includes:   Following a healthy meal plan.   Controlling your weight.   Getting enough exercise and proper rest.   Do not smoke.   Breastfeed your baby if you can. This will lower the chance of you and your baby developing diabetes later in life.  For more information about diabetes, go to the American  Diabetes Association at: www.americandiabetesassociation.org. For more information about gestational diabetes, go to the American Congress of Obstetricians and Gynecologists at: www.acog.org. Document Released: 09/30/2000 Document Re-Released: 06/12/2009 Exit[employer] Patient Information 2011 [employer]. Pregnancy - Second Trimester The second trimester of pregnancy (3 to 6 months) is a period of rapid growth for you and your baby. At the end of the sixth month, your baby is about 9 inches long and weighs 1 1/2 pounds. You will begin to feel the baby move between 18 and 20 weeks of the pregnancy. This is called quickening. Weight gain is faster. A clear fluid (colostrum) may leak out of your breasts. You may feel small contractions of the womb (uterus). This is known as false labor or Braxton-Hicks contractions. This is like a practice for labor when the baby is ready to be born. Usually, the problems with morning sickness have usually passed by the end of your first trimester. Some women   develop small dark blotches (called cholasma, mask of pregnancy) on their face that usually goes away after the baby is born. Exposure to the sun makes the blotches worse. Acne may also develop in some pregnant women and pregnant women who have acne, may find that it goes away. PRENATAL EXAMS  Blood work may continue to be done during prenatal exams. These tests are done to check on your health and the probable health of your baby. Blood work is used to follow your blood levels (hemoglobin). Anemia (low hemoglobin) is common during pregnancy. Iron and vitamins are given to help prevent this. You will also be checked for diabetes between 24 and 28 weeks of the pregnancy. Some of the previous blood tests may be repeated.   The size of the uterus is measured during each visit. This is to make sure that the baby is continuing to grow properly according to the dates of the pregnancy.   Your blood pressure is checked every  prenatal visit. This is to make sure you are not getting toxemia.   Your urine is checked to make sure you do not have an infection, diabetes or protein in the urine.   Your weight is checked often to make sure gains are happening at the suggested rate. This is to ensure that both you and your baby are growing normally.   Sometimes, an ultrasound is performed to confirm the proper growth and development of the baby. This is a test which bounces harmless sound waves off the baby so your caregiver can more accurately determine due dates.  Sometimes, a specialized test is done on the amniotic fluid surrounding the baby. This test is called an amniocentesis. The amniotic fluid is obtained by sticking a needle into the belly (abdomen). This is done to check the chromosomes in instances where there is a concern about possible genetic problems with the baby. It is also sometimes done near the end of pregnancy if an early delivery is required. In this case, it is done to help make sure the baby's lungs are mature enough for the baby to live outside of the womb. CHANGES OCCURING IN THE SECOND TRIMESTER OF PREGNANCY Your body goes through many changes during pregnancy. They vary from person to person. Talk to your caregiver about changes you notice that you are concerned about.  During the second trimester, you will likely have an increase in your appetite. It is normal to have cravings for certain foods. This varies from person to person and pregnancy to pregnancy.   Your lower abdomen will begin to bulge.   You may have to urinate more often because the uterus and baby are pressing on your bladder. It is also common to get more bladder infections during pregnancy (pain with urination). You can help this by drinking lots of fluids and emptying your bladder before and after intercourse.   You may begin to get stretch marks on your hips, abdomen, and breasts. These are normal changes in the body during  pregnancy. There are no exercises or medications to take that prevent this change.   You may begin to develop swollen and bulging veins (varicose veins) in your legs. Wearing support hose, elevating your feet for 15 minutes, 3 to 4 times a day and limiting salt in your diet helps lessen the problem.   Heartburn may develop as the uterus grows and pushes up against the stomach. Antacids recommended by your caregiver helps with this problem. Also, eating smaller meals 4 to   5 times a day helps.   Constipation can be treated with a stool softener or adding bulk to your diet. Drinking lots of fluids, vegetables, fruits, and whole grains are helpful.   Exercising is also helpful. If you have been very active up until your pregnancy, most of these activities can be continued during your pregnancy. If you have been less active, it is helpful to start an exercise program such as walking.   Hemorrhoids (varicose veins in the rectum) may develop at the end of the second trimester. Warm sitz baths and hemorrhoid cream recommended by your caregiver helps hemorrhoid problems.   Backaches may develop during this time of your pregnancy. Avoid heavy lifting, wear low heal shoes and practice good posture to help with backache problems.   Some pregnant women develop tingling and numbness of their hand and fingers because of swelling and tightening of ligaments in the wrist (carpel tunnel syndrome). This goes away after the baby is born.   As your breasts enlarge, you may have to get a bigger bra. Get a comfortable, cotton, support bra. Do not get a nursing bra until the last month of the pregnancy if you will be nursing the baby.   You may get a dark line from your belly button to the pubic area called the linea nigra.   You may develop rosy cheeks because of increase blood flow to the face.   You may develop spider looking lines of the face, neck, arms and chest. These go away after the baby is born.  HOME CARE  INSTRUCTIONS  It is extremely important to avoid all smoking, herbs, alcohol, and un-prescribed drugs during your pregnancy. These chemicals affect the formation and growth of the baby. Avoid these chemicals throughout the pregnancy to ensure the delivery of a healthy infant.   Most of your home care instructions are the same as suggested for the first trimester of your pregnancy. Keep your caregiver's appointments. Follow your caregiver's instructions regarding medication use, exercise and diet.   During pregnancy, you are providing food for you and your baby. Continue to eat regular, well-balanced meals. Choose foods such as meat, fish, milk and other low fat dairy products, vegetables, fruits, and whole-grain breads and cereals. Your caregiver will tell you of the ideal weight gain.   A physical sexual relationship may be continued up until near the end of pregnancy if there are no other problems. Problems could include early (premature) leaking of amniotic fluid from the membranes, vaginal bleeding, abdominal pain, or other medical or pregnancy problems.   Exercise regularly if there are no restrictions. Check with your caregiver if you are unsure of the safety of some of your exercises. The greatest weight gain will occur in the last 2 trimesters of pregnancy. Exercise will help you:   Control your weight.   Get you in shape for labor and delivery.   Lose weight after you have the baby.   Wear a good support or jogging bra for breast tenderness during pregnancy. This may help if worn during sleep. Pads or tissues may be used in the bra if you are leaking colostrum.   Do not use hot tubs, steam rooms or saunas throughout the pregnancy.   Wear your seat belt at all times when driving. This protects you and your baby if you are in an accident.   Avoid raw meat, uncooked cheese, cat litter boxes and soil used by cats. These carry germs that can cause birth defects in   the baby.   The second  trimester is also a good time to visit your dentist for your dental health if this has not been done yet. Getting your teeth cleaned is OK. Use a soft toothbrush. Brush gently during pregnancy.   It is easier to loose urine during pregnancy. Tightening up and strengthening the pelvic muscles will help with this problem. Practice stopping your urination while you are going to the bathroom. These are the same muscles you need to strengthen. It is also the muscles you would use as if you were trying to stop from passing gas. You can practice tightening these muscles up 10 times a set and repeating this about 3 times per day. Once you know what muscles to tighten up, do not perform these exercises during urination. It is more likely to contribute to an infection by backing up the urine.   Ask for help if you have financial, counseling or nutritional needs during pregnancy. Your caregiver will be able to offer counseling for these needs as well as refer you for other special needs.   Your skin may become oily. If so, wash your face with mild soap, use non-greasy moisturizer and oil or cream based makeup.  MEDICATIONS AND DRUG USE IN PREGNANCY  Take prenatal vitamins as directed. The vitamin should contain 1 milligram of folic acid. Keep all vitamins out of reach of children. Only a couple vitamins or tablets containing iron may be fatal to a baby or young child when ingested.   Avoid use of all medications, including herbs, over-the-counter medications, not prescribed or suggested by your caregiver. Only take over-the-counter or prescription medicines for pain, discomfort, or fever as directed by your caregiver. Do not use aspirin.   Let your caregiver also know about herbs you may be using.   Alcohol is related to a number of birth defects. This includes fetal alcohol syndrome. All alcohol, in any form, should be avoided completely. Smoking will cause low birth rate and premature babies.    Street/illegal drugs are very harmful to the baby. They are absolutely forbidden. A baby born to an addicted mother will be addicted at birth. The baby will go through the same withdrawal an adult does.  SEEK MEDICAL CARE IF:  You have any concerns or worries during your pregnancy. It is better to call with your questions if you feel they cannot wait, rather than worry about them.  SEEK IMMEDIATE MEDICAL CARE IF:  An unexplained oral temperature above 101 develops, or as your caregiver suggests.   You have leaking of fluid from the vagina (birth canal). If leaking membranes are suspected, take your temperature and tell your caregiver of this when you call.   There is vaginal spotting, bleeding, or passing clots. Tell your caregiver of the amount and how many pads are used. Light spotting in pregnancy is common, especially following intercourse.   You develop a bad smelling vaginal discharge with a change in the color from clear to white.   You continue to feel sick to your stomach (nauseated) and have no relief from remedies suggested. You vomit blood or coffee ground like materials.   You loose more than 2 pounds of weight or gain more than 2 pounds of weight over a weeks time, or as suggested by your caregiver.   You notice swelling of your face, hands, and feet or legs.   You get exposed to German measles and have never had them.   You are exposed to fifth   disease or chicken pox.   You develop belly (abdominal) pain. Round ligament discomfort is a common non-cancerous (benign) cause of abdominal pain in pregnancy. Your caregiver still must evaluate you.   You develop a bad headache that does not go away.   You develop fever, diarrhea, pain with urination or shortness of breath.   You develop visual problems, blurry or double vision.   You fall, are in a car accident or any kind of trauma.   There is mental or physical violence at home.  Document Released: 06/18/2001 Document  Re-Released: 09/18/2009 Exit[employer] Patient Information 2011 [employer]. 

## 2011-04-22 NOTE — Assessment & Plan Note (Signed)
May need more glyburide at hs.

## 2011-05-09 NOTE — ED Provider Notes (Signed)
Agree with above note.  Keir Foland 05/09/2011 3:40 PM   

## 2011-05-13 ENCOUNTER — Ambulatory Visit (INDEPENDENT_AMBULATORY_CARE_PROVIDER_SITE_OTHER): Payer: Medicaid Other | Admitting: Obstetrics & Gynecology

## 2011-05-13 ENCOUNTER — Encounter: Payer: Medicaid Other | Attending: Obstetrics & Gynecology | Admitting: Dietician

## 2011-05-13 VITALS — BP 99/66 | Temp 97.6°F | Wt 186.3 lb

## 2011-05-13 DIAGNOSIS — O24919 Unspecified diabetes mellitus in pregnancy, unspecified trimester: Secondary | ICD-10-CM

## 2011-05-13 DIAGNOSIS — O99810 Abnormal glucose complicating pregnancy: Secondary | ICD-10-CM | POA: Insufficient documentation

## 2011-05-13 DIAGNOSIS — Z713 Dietary counseling and surveillance: Secondary | ICD-10-CM | POA: Insufficient documentation

## 2011-05-13 DIAGNOSIS — Z23 Encounter for immunization: Secondary | ICD-10-CM

## 2011-05-13 DIAGNOSIS — O0990 Supervision of high risk pregnancy, unspecified, unspecified trimester: Secondary | ICD-10-CM

## 2011-05-13 LAB — POCT URINALYSIS DIP (DEVICE)
Bilirubin Urine: NEGATIVE
Glucose, UA: NEGATIVE mg/dL
Leukocytes, UA: NEGATIVE
Nitrite: NEGATIVE
Protein, ur: NEGATIVE mg/dL
Specific Gravity, Urine: 1.02 (ref 1.005–1.030)
Urobilinogen, UA: 1 mg/dL (ref 0.0–1.0)
pH: 7 (ref 5.0–8.0)

## 2011-05-13 LAB — GLUCOSE, CAPILLARY: Glucose-Capillary: 179 mg/dL — ABNORMAL HIGH (ref 70–99)

## 2011-05-13 MED ORDER — INSULIN REGULAR HUMAN 100 UNIT/ML IJ SOLN: 11.0000 [IU] | Freq: Every day | INTRAMUSCULAR | Status: DC

## 2011-05-13 MED ORDER — TETANUS-DIPHTH-ACELL PERTUSSIS 5-2.5-18.5 LF-MCG/0.5 IM SUSP
0.5000 mL | Freq: Once | INTRAMUSCULAR | Status: AC
  Administered 2011-05-13: 0.5 mL via INTRAMUSCULAR

## 2011-05-13 MED ORDER — INSULIN NPH (HUMAN) (ISOPHANE) 100 UNIT/ML ~~LOC~~ SUSP: 28.0000 [IU] | Freq: Every morning | SUBCUTANEOUS | Status: DC

## 2011-05-13 MED ORDER — INSULIN NPH (HUMAN) (ISOPHANE) 100 UNIT/ML ~~LOC~~ SUSP: 11.0000 [IU] | Freq: Every day | SUBCUTANEOUS | Status: DC

## 2011-05-13 MED ORDER — INSULIN REGULAR HUMAN 100 UNIT/ML IJ SOLN: 14.0000 [IU] | Freq: Every morning | INTRAMUSCULAR | Status: DC

## 2011-05-13 NOTE — Progress Notes (Signed)
FAstings are in the 90s to 100s.  Not checking every morning.  Only checks one other time a day--pt understands she has been instructed to check 4 times a day.  Post prandials in the evening are always elevated and up to 190s.  2 hr postprandial today is 179 in the office. Will start weight based insulin.  Marland Kitchen8 x wt in kg.  Malaria--no issues Hep B--stable (has had since pregnancy of 2nd child)--LFTS one pint elevated in October Thrombocytopenia--stable RTC 1 week  Pt's fundal height is not accurate due to pannus.  Will get [place] as necessary.  Pt warned of fetal death and insulin shock without proper CBG monitoring.  Wt in KG=84kg 84(.8)=66 units NPH 28 units q am, Reg 14 units q am, Reg 11 units before dinner, NPH 11 units at bedime

## 2011-05-13 NOTE — Progress Notes (Signed)
Nutrition Note:  Seen for 1st consult.  Pt receives [facility] services. Dx. Obesity, GDM. Current wt gain of 26.5# plots 15#> expected at [redacted]w[redacted]d gestation. Pt has poor eating habits and does not follow GDM diet recs.  Pt reports 3 meals and fruits only for snacks. Pt reports only checking her BS 1x daily, not 4 x as instructed. Pt reports limited physical activity.  Pt does take her PNV and insulin as instructed. Reviewed importance of GDM diet, especially combining fruit serving with protein serving after 10 am.   Disc wt gain goals and need to increase physical activity.   Follow up in 4 weeks.  Cy Blamer, RD

## 2011-05-13 NOTE — Progress Notes (Signed)
Diabetes Education: 05/13/2011 not checking her blood glucose levels.  Complains that 'it hurts too bad."  Reviewed the process and found that she is using the tip of her finger.  I insisted that she need to use the side of her finger after washing her hands with warm soap and water and to rotate the site.  She complains of having periods of shaky, sweaty with the pill she takes for her glucose level.  Reminded her that we need to know the numbers in order to change the dose.  She is getting up at 3;30-4:00 AM with the shaky and having a slice of the whole wheat bread with the PB and drinking water and going back to bed.  I emphasized that we need to know what the glucose levels are and we can then arrive at a med level to meet the need of the glucose. Reviewed the testing times and levels.  Today past breakfast at 8:30 AM, her glucose at 10:15 was 103 mg.  Maggie Davieon Stockham, RN, RD, CDE.

## 2011-05-20 ENCOUNTER — Other Ambulatory Visit: Payer: Self-pay | Admitting: Obstetrics & Gynecology

## 2011-05-20 ENCOUNTER — Ambulatory Visit (INDEPENDENT_AMBULATORY_CARE_PROVIDER_SITE_OTHER): Payer: Medicaid Other | Admitting: Obstetrics & Gynecology

## 2011-05-20 VITALS — BP 92/63 | Temp 98.9°F | Wt 186.2 lb

## 2011-05-20 DIAGNOSIS — O24919 Unspecified diabetes mellitus in pregnancy, unspecified trimester: Secondary | ICD-10-CM

## 2011-05-20 LAB — POCT URINALYSIS DIP (DEVICE)
Glucose, UA: >=1000 mg/dL — AB
Nitrite: NEGATIVE
Urobilinogen, UA: 1 mg/dL (ref 0.0–1.0)

## 2011-05-20 NOTE — Progress Notes (Signed)
Pt given information for [facility] on [address]. I spoke with receptionist and she said that patient just needs to call herself and make an appt. They will file her medicaid. Pt given information and will call for appt.

## 2011-05-20 NOTE — Patient Instructions (Signed)

## 2011-05-20 NOTE — Progress Notes (Signed)
Pulse-  95 

## 2011-05-20 NOTE — Progress Notes (Signed)
Lg blood in U/A, will do micro.   Only using Glyburide, never started insulin Sometime feel sugar is low after evening dose of glyburide, has low back pain and pressure at work. Plans to stop work due to pain. Will recommend chiro for back pain issues FBS 53-108, PP 53-132   2 week return

## 2011-06-10 ENCOUNTER — Ambulatory Visit (INDEPENDENT_AMBULATORY_CARE_PROVIDER_SITE_OTHER): Payer: Medicaid Other | Admitting: Obstetrics & Gynecology

## 2011-06-10 ENCOUNTER — Encounter: Payer: Medicaid Other | Attending: Obstetrics & Gynecology | Admitting: Dietician

## 2011-06-10 VITALS — BP 101/69 | HR 88 | Temp 97.7°F | Wt 190.1 lb

## 2011-06-10 DIAGNOSIS — B1910 Unspecified viral hepatitis B without hepatic coma: Secondary | ICD-10-CM

## 2011-06-10 DIAGNOSIS — O99810 Abnormal glucose complicating pregnancy: Secondary | ICD-10-CM | POA: Insufficient documentation

## 2011-06-10 DIAGNOSIS — O0990 Supervision of high risk pregnancy, unspecified, unspecified trimester: Secondary | ICD-10-CM

## 2011-06-10 DIAGNOSIS — Z713 Dietary counseling and surveillance: Secondary | ICD-10-CM | POA: Insufficient documentation

## 2011-06-10 DIAGNOSIS — O24919 Unspecified diabetes mellitus in pregnancy, unspecified trimester: Secondary | ICD-10-CM

## 2011-06-10 LAB — POCT URINALYSIS DIP (DEVICE)
Bilirubin Urine: NEGATIVE
Glucose, UA: NEGATIVE mg/dL
Nitrite: NEGATIVE
Urobilinogen, UA: 1 mg/dL (ref 0.0–1.0)
pH: 6.5 (ref 5.0–8.0)

## 2011-06-10 LAB — CBC
Hemoglobin: 10.5 g/dL — ABNORMAL LOW (ref 12.0–15.0)
MCH: 27.3 pg (ref 26.0–34.0)
Platelets: 141 10*3/uL — ABNORMAL LOW (ref 150–400)
RBC: 3.84 MIL/uL — ABNORMAL LOW (ref 3.87–5.11)
WBC: 5.3 10*3/uL (ref 4.0–10.5)

## 2011-06-10 LAB — COMPREHENSIVE METABOLIC PANEL
ALT: 16 U/L (ref 0–35)
AST: 26 U/L (ref 0–37)
Alkaline Phosphatase: 90 U/L (ref 39–117)
Calcium: 8.8 mg/dL (ref 8.4–10.5)
Chloride: 103 mEq/L (ref 96–112)
Creat: 0.47 mg/dL — ABNORMAL LOW (ref 0.50–1.10)

## 2011-06-10 MED ORDER — GLUCOSE BLOOD VI STRP: ORAL_STRIP | Status: DC

## 2011-06-10 MED ORDER — "INSULIN SYRINGE 31G X 5/16"" 0.5 ML MISC": 1.0000 | Freq: Three times a day (TID) | Status: DC

## 2011-06-10 MED ORDER — POLYETHYLENE GLYCOL 3350 17 GM/SCOOP PO POWD: 17.0000 g | Freq: Every day | ORAL | Status: AC

## 2011-06-10 NOTE — Progress Notes (Signed)
Fetal Echo scheduled on 06/24/11 at 11 am. U/S scheduled on 06/17/11 at 915 am.

## 2011-06-10 NOTE — Progress Notes (Signed)
See Brenda Cannon's note about CBGs.  Will give prescriptions to she can check blood sugars again.  Pt told to not wait until office visit if she runs out of equipment.  We are treating patient like A2/B.  Needs fetal echo and 24 hour urine.  Pt wants to VBAC but will not sign consent.  "Everything is in hands of God."  Pt understands she will have to sign at some point.  S>D--will get growth [place].  Will get Urine Culture since moderate blood.

## 2011-06-10 NOTE — Assessment & Plan Note (Signed)
Genetic screening discussed with patient on 03/04/11 and again on 03/18/11 per provider notes - patient was undecided.

## 2011-06-10 NOTE — Patient Instructions (Signed)
Breastfeeding BENEFITS OF BREASTFEEDING For the baby  The first milk (colostrum) helps the baby's digestive system function better.   There are antibodies from the mother in the milk that help the baby fight off infections.   The baby has a lower incidence of asthma, allergies, and SIDS (sudden infant death syndrome).   The nutrients in breast milk are better than formulas for the baby and helps the baby's brain grow better.   Babies who breastfeed have less gas, colic, and constipation.  For the mother  Breastfeeding helps develop a very special bond between mother and baby.   It is more convenient, always available at the correct temperature and cheaper than formula feeding.   It burns calories in the mother and helps with losing weight that was gained during pregnancy.   It makes the uterus contract back down to normal size faster and slows bleeding following delivery.   Breastfeeding mothers have a lower risk of developing breast cancer.  NURSE FREQUENTLY  A healthy, full-term baby may breastfeed as often as every hour or space his or her feedings to every 3 hours.   How often to nurse will vary from baby to baby. Watch your baby for signs of hunger, not the clock.   Nurse as often as the baby requests, or when you feel the need to reduce the fullness of your breasts.   Awaken the baby if it has been 3 to 4 hours since the last feeding.   Frequent feeding will help the mother make more milk and will prevent problems like sore nipples and engorgement of the breasts.  BABY'S POSITION AT THE BREAST  Whether lying down or sitting, be sure that the baby's tummy is facing your tummy.   Support the breast with 4 fingers underneath the breast and the thumb above. Make sure your fingers are well away from the nipple and baby's mouth.   Stroke the baby's lips and cheek closest to the breast gently with your finger or nipple.   When the baby's mouth is open wide enough, place  all of your nipple and as much of the dark area around the nipple as possible into your baby's mouth.   Pull the baby in close so the tip of the nose and the baby's cheeks touch the breast during the feeding.  FEEDINGS  The length of each feeding varies from baby to baby and from feeding to feeding.   The baby must suck about 2 to 3 minutes for your milk to get to him or her. This is called a "let down." For this reason, allow the baby to feed on each breast as long as he or she wants. Your baby will end the feeding when he or she has received the right balance of nutrients.   To break the suction, put your finger into the corner of the baby's mouth and slide it between his or her gums before removing your breast from his or her mouth. This will help prevent sore nipples.  REDUCING BREAST ENGORGEMENT  In the first week after your baby is born, you may experience signs of breast engorgement. When breasts are engorged, they feel heavy, warm, full, and may be tender to the touch. You can reduce engorgement if you:   Nurse frequently, every 2 to 3 hours. Mothers who breastfeed early and often have fewer problems with engorgement.   Place light ice packs on your breasts between feedings. This reduces swelling. Wrap the ice packs in a   lightweight towel to protect your skin.   Apply moist hot packs to your breast for 5 to 10 minutes before each feeding. This increases circulation and helps the milk flow.   Gently massage your breast before and during the feeding.   Make sure that the baby empties at least one breast at every feeding before switching sides.   Use a breast pump to empty the breasts if your baby is sleepy or not nursing well. You may also want to pump if you are returning to work or or you feel you are getting engorged.   Avoid bottle feeds, pacifiers or supplemental feedings of water or juice in place of breastfeeding.   Be sure the baby is latched on and positioned properly while  breastfeeding.   Prevent fatigue, stress, and anemia.   Wear a supportive bra, avoiding underwire styles.   Eat a balanced diet with enough fluids.  If you follow these suggestions, your engorgement should improve in 24 to 48 hours. If you are still experiencing difficulty, call your lactation consultant or caregiver. IS MY BABY GETTING ENOUGH MILK? Sometimes, mothers worry about whether their babies are getting enough milk. You can be assured that your baby is getting enough milk if:  The baby is actively sucking and you hear swallowing.   The baby nurses at least 8 to 12 times in a 24 hour time period. Nurse your baby until he or she unlatches or falls asleep at the first breast (at least 10 to 20 minutes), then offer the second side.   The baby is wetting 5 to 6 disposable diapers (6 to 8 cloth diapers) in a 24 hour period by 5 to 6 days of age.   The baby is having at least 2 to 3 stools every 24 hours for the first few months. Breast milk is all the food your baby needs. It is not necessary for your baby to have water or formula. In fact, to help your breasts make more milk, it is best not to give your baby supplemental feedings during the early weeks.   The stool should be soft and yellow.   The baby should gain 4 to 7 ounces per week after he is 4 days old.  TAKE CARE OF YOURSELF Take care of your breasts by:  Bathing or showering daily.   Avoiding the use of soaps on your nipples.   Start feedings on your left breast at one feeding and on your right breast at the next feeding.   You will notice an increase in your milk supply 2 to 5 days after delivery. You may feel some discomfort from engorgement, which makes your breasts very firm and often tender. Engorgement "peaks" out within 24 to 48 hours. In the meantime, apply warm moist towels to your breasts for 5 to 10 minutes before feeding. Gentle massage and expression of some milk before feeding will soften your breasts, making  it easier for your baby to latch on. Wear a well fitting nursing bra and air dry your nipples for 10 to 15 minutes after each feeding.   Only use cotton bra pads.   Only use pure lanolin on your nipples after nursing. You do not need to wash it off before nursing.  Take care of yourself by:   Eating well-balanced meals and nutritious snacks.   Drinking milk, fruit juice, and water to satisfy your thirst (about 8 glasses a day).   Getting plenty of rest.   Increasing calcium in   your diet (1200 mg a day).   Avoiding foods that you notice affect the baby in a bad way.  SEEK MEDICAL CARE IF:   You have any questions or difficulty with breastfeeding.   You need help.   You have a hard, red, sore area on your breast, accompanied by a fever of 100.5 F (38.1 C) or more.   Your baby is too sleepy to eat well or is having trouble sleeping.   Your baby is wetting less than 6 diapers per day, by 5 days of age.   Your baby's skin or white part of his or her eyes is more yellow than it was in the hospital.   You feel depressed.  Document Released: 06/24/2005 Document Revised: 03/06/2011 Document Reviewed: 02/06/2009 [employer] Patient Information 2012 [employer]. 

## 2011-06-10 NOTE — Progress Notes (Signed)
06/10/2011 Diabetes Education:  C/o having problems with broken auto and lack of travel ability.  Did not get to pharmacy and have her medication or her strips and lancets refilled.  Current taking the glyburide.  Relates pharmacist would not provide the syringes as she did not have a prescription.  Has been taking the glyburide but not consistently monitoring.  Has many missing dates  11/19-11/24 has fasting of 123, 95, 82, 90, 109, 96.  Before dinner123, after dinner, 92 after lunch 116  11/25-11/29/2012= fasting 111, 79, 107, 96, 96, post some random meals 67, 141, 120.  To monitor more regularly, especially if starting insulin. 2. Prescription for syringes, 3.  See Maggie for insulin instruction. Will review monitoring schedule.  Maggie Ralphine Hinks, RN, RD, CDE. Continued per M Harmani Neto  Provided review of NPH and Regular Insulins and insulin instruction and demonstration.  We reviewed the mixing of the AM Dose of NPH and Regular.  She was not totally accurate with me regarding the amount of insulin when drawing up the insulin.  She did not give me a straight answer, but I question her reading and counting skills with [place].  She completed the return demonstration for drawing up the insulin with a great deal of help and then refused to do the injecting of the sterile saline portion of the return demonstration.  She questions the need for insulin and verbalizes that "the pills will work better"  I will plan to follow-up next week.  Maggie Jaclyn Carew, RN, RD, CDE.

## 2011-06-10 NOTE — Progress Notes (Signed)
Pulse 96. No vaginal discharge. Pt states has not started insulin.

## 2011-06-12 NOTE — Progress Notes (Signed)
Addended by: Faythe Casa on: 06/12/2011 11:10 AM   Modules accepted: Orders

## 2011-06-13 LAB — CREATININE CLEARANCE, URINE, 24 HOUR
Creatinine Clearance: 178 mL/min — ABNORMAL HIGH (ref 75–115)
Creatinine, 24H Ur: 1203 mg/d (ref 700–1800)
Creatinine, Urine: 77.6 mg/dL
Creatinine: 0.47 mg/dL — ABNORMAL LOW (ref 0.50–1.10)

## 2011-06-13 LAB — CULTURE, OB URINE

## 2011-06-17 ENCOUNTER — Ambulatory Visit (INDEPENDENT_AMBULATORY_CARE_PROVIDER_SITE_OTHER): Payer: Medicaid Other | Admitting: Obstetrics & Gynecology

## 2011-06-17 ENCOUNTER — Ambulatory Visit (HOSPITAL_COMMUNITY)
Admission: RE | Admit: 2011-06-17 | Discharge: 2011-06-17 | Disposition: A | Discharge status: Home / Self Care | Payer: Medicaid Other | Source: Ambulatory Visit | Attending: Obstetrics & Gynecology | Admitting: Obstetrics & Gynecology

## 2011-06-17 VITALS — BP 105/69 | Temp 97.9°F | Wt 189.7 lb

## 2011-06-17 DIAGNOSIS — O99810 Abnormal glucose complicating pregnancy: Secondary | ICD-10-CM | POA: Insufficient documentation

## 2011-06-17 DIAGNOSIS — O24919 Unspecified diabetes mellitus in pregnancy, unspecified trimester: Secondary | ICD-10-CM

## 2011-06-17 DIAGNOSIS — O3660X0 Maternal care for excessive fetal growth, unspecified trimester, not applicable or unspecified: Secondary | ICD-10-CM | POA: Insufficient documentation

## 2011-06-17 LAB — POCT URINALYSIS DIP (DEVICE)
Glucose, UA: NEGATIVE mg/dL
Nitrite: NEGATIVE
Protein, ur: NEGATIVE mg/dL
Urobilinogen, UA: 0.2 mg/dL (ref 0.0–1.0)

## 2011-06-17 MED ORDER — INSULIN NPH (HUMAN) (ISOPHANE) 100 UNIT/ML ~~LOC~~ SUSP: 13.0000 [IU] | Freq: Every day | SUBCUTANEOUS | Status: DC

## 2011-06-17 NOTE — Progress Notes (Signed)
U/S 06/17/11 EFW 1982g/>90%, AFI 17.93. Patient counseled regarding concern about macrosomia, will continue serial growth scans. Fetal ECHO next week. Fasting BS in 100s; NPH qhs increased to 13 units, will keep rest the same. Reevaluate in one week. Start 2x week testing at 32 weeks.  No other complaints or concerns.  Fetal movement and labor precautions reviewed.

## 2011-06-17 NOTE — Patient Instructions (Signed)
Please call clinic at [phone] or come to the MAU here at [facility] for any concerns.   

## 2011-06-19 ENCOUNTER — Encounter: Payer: Self-pay | Admitting: Obstetrics & Gynecology

## 2011-06-19 DIAGNOSIS — O3660X0 Maternal care for excessive fetal growth, unspecified trimester, not applicable or unspecified: Secondary | ICD-10-CM | POA: Insufficient documentation

## 2011-06-30 ENCOUNTER — Encounter (HOSPITAL_COMMUNITY): Payer: Self-pay | Admitting: Obstetrics and Gynecology

## 2011-06-30 ENCOUNTER — Inpatient Hospital Stay (HOSPITAL_COMMUNITY)
Admission: AD | Admit: 2011-06-30 | Discharge: 2011-06-30 | Disposition: A | Discharge status: Home / Self Care | Payer: Medicaid Other | Source: Ambulatory Visit | Attending: Obstetrics and Gynecology | Admitting: Obstetrics and Gynecology

## 2011-06-30 DIAGNOSIS — K529 Noninfective gastroenteritis and colitis, unspecified: Secondary | ICD-10-CM

## 2011-06-30 DIAGNOSIS — R197 Diarrhea, unspecified: Secondary | ICD-10-CM | POA: Insufficient documentation

## 2011-06-30 DIAGNOSIS — O212 Late vomiting of pregnancy: Secondary | ICD-10-CM | POA: Insufficient documentation

## 2011-06-30 DIAGNOSIS — K5289 Other specified noninfective gastroenteritis and colitis: Secondary | ICD-10-CM

## 2011-06-30 LAB — CBC
HCT: 31.5 % — ABNORMAL LOW (ref 36.0–46.0)
Hemoglobin: 10.3 g/dL — ABNORMAL LOW (ref 12.0–15.0)
MCHC: 32.7 g/dL (ref 30.0–36.0)
MCV: 82 fL (ref 78.0–100.0)
RDW: 14.4 % (ref 11.5–15.5)

## 2011-06-30 LAB — URINALYSIS, ROUTINE W REFLEX MICROSCOPIC
Bilirubin Urine: NEGATIVE
Ketones, ur: NEGATIVE mg/dL
Nitrite: NEGATIVE
Specific Gravity, Urine: 1.025 (ref 1.005–1.030)
pH: 6.5 (ref 5.0–8.0)

## 2011-06-30 LAB — DIFFERENTIAL
Basophils Absolute: 0 10*3/uL (ref 0.0–0.1)
Eosinophils Relative: 2 % (ref 0–5)
Lymphocytes Relative: 26 % (ref 12–46)
Monocytes Absolute: 0.4 10*3/uL (ref 0.1–1.0)
Monocytes Relative: 9 % (ref 3–12)
Neutro Abs: 3.2 10*3/uL (ref 1.7–7.7)

## 2011-06-30 LAB — URINE MICROSCOPIC-ADD ON

## 2011-06-30 LAB — COMPREHENSIVE METABOLIC PANEL
ALT: 14 U/L (ref 0–35)
Albumin: 2.4 g/dL — ABNORMAL LOW (ref 3.5–5.2)
Alkaline Phosphatase: 144 U/L — ABNORMAL HIGH (ref 39–117)
Calcium: 9 mg/dL (ref 8.4–10.5)
Potassium: 3.1 mEq/L — ABNORMAL LOW (ref 3.5–5.1)
Sodium: 135 mEq/L (ref 135–145)
Total Protein: 6.7 g/dL (ref 6.0–8.3)

## 2011-06-30 MED ORDER — GI COCKTAIL ~~LOC~~
30.0000 mL | Freq: Once | ORAL | Status: AC
  Administered 2011-06-30: 30 mL via ORAL
  Filled 2011-06-30: qty 30

## 2011-06-30 MED ORDER — ONDANSETRON 8 MG PO TBDP
8.0000 mg | ORAL_TABLET | Freq: Once | ORAL | Status: AC
  Administered 2011-06-30: 8 mg via ORAL
  Filled 2011-06-30: qty 1

## 2011-06-30 MED ORDER — ONDANSETRON 4 MG PO TBDP: 4.0000 mg | ORAL_TABLET | Freq: Three times a day (TID) | ORAL | Status: DC | PRN

## 2011-06-30 MED ORDER — ONDANSETRON 4 MG PO TBDP: 4.0000 mg | ORAL_TABLET | Freq: Three times a day (TID) | ORAL | Status: AC | PRN

## 2011-06-30 NOTE — ED Provider Notes (Signed)
History     No chief complaint on file.  HPI Brenda Cannon 30 y.o. female  [phone] at [redacted]w[redacted]d presenting with diarrhea x 4 days.   Patient also started having nonbloody/non mucosy emesis at 1am this morning. She has vomited x5. In previous 4 days, describes at least 4 watery stools per day. Says she feels like she has gas but no abdominal pain. No blood or mucus in stool. No fever or chills. Complains of "heart hurting" and throat burning since starting to throw up.   Patient denies contractions/decreased fetal movement/discharge/blood from vagina/rush of fluid.     OB History    Grav Para Term Preterm Abortions TAB SAB Ect Mult Living   5 2 2  0 2  1 0 0 2      Past Medical History  Diagnosis Date  . Gestational diabetes     GDM with last pregnancy (2008)    Past Surgical History  Procedure Date  . Cesarean section 2008    last pregnacy    Family History  Problem Relation Age of Onset  . Hypertension Mother     History  Substance Use Topics  . Smoking status: Never Smoker   . Smokeless tobacco: Not on file  . Alcohol Use: No    Allergies:  Allergies  Allergen Reactions  . Chloroquine Itching  . Keflex Itching and Rash    Prescriptions prior to admission  Medication Sig Dispense Refill  . Blood Glucose Monitoring Suppl (ACCU-CHEK NANO SMARTVIEW) W/DEVICE KIT 1 Device by Does not apply route once.  1 kit  0  . glucose blood (ACCU-CHEK INSTANT GLUCOSE TEST) test strip Use as instructed  100 each  12  . glucose blood test strip Use as instructed  100 each  12  . glyBURIDE (DIABETA) 2.5 MG tablet Take 5 mg by mouth 2 (two) times daily.        . insulin NPH (HUMULIN N) 100 UNIT/ML injection Inject 28 Units into the skin AC breakfast.  10 mL  12  . insulin NPH (HUMULIN N) 100 UNIT/ML injection Inject 13 Units into the skin daily before supper.  10 mL  12  . insulin regular (HUMULIN R) 100 units/mL injection Inject 0.14 mLs (14 Units total) into the skin AC  breakfast.  10 mL  12  . insulin regular (HUMULIN R) 100 units/mL injection Inject 0.11 mLs (11 Units total) into the skin daily before supper.  10 mL  12  . Insulin Syringe-Needle U-100 (INSULIN SYRINGE .5CC/31GX5/16") 31G X 5/16" 0.5 ML MISC 1 Syringe by Does not apply route 3 (three) times daily.  100 each  12  . Lancets Misc. (ACCU-CHEK FASTCLIX LANCET) KIT 1 kit by Does not apply route 4 (four) times daily.  1 kit  6  . prenatal vitamin w/FE, FA (PRENATAL 1 + 1) 27-1 MG TABS Take 1 tablet by mouth daily.          ROS negative except as noted in HPI   Physical Exam   Last menstrual period 11/24/2010.  Physical Exam  Constitutional: She is oriented to person, place, and time. She appears well-developed and well-nourished.       <2 second capillary refill. Moist mucous membranes-patient appears well hydrated.    Cardiovascular: Normal rate and regular rhythm.  Exam reveals no gallop.   Murmur (2/6 systolic ejection murmur best heard in aortic area) heard. Respiratory: Breath sounds normal. No respiratory distress. She has no wheezes. She has no rales.  GI: Bowel sounds are normal. She exhibits no distension. There is no tenderness.       Gravid. Size consistent with dates.    Musculoskeletal: Normal range of motion. She exhibits no edema.  Neurological: She is alert and oriented to person, place, and time.    FHT-145 baseline. Moderate variability 10x10 accels. No decels.   MAU Course  Procedures  MDM Cbc cmet Stool cultures Reassuring FHT Urinalysis-hgb, rbc, calcium oxalate crystals  Patient without urinary complaints or pain complaints-will encourage hydratoin  Will give zofran followed by GI cocktail.   Assessment and Plan  #1 [id number] at [redacted]w[redacted]d  Encourage hydration.  Pending further workup.  Will transfer care to day team.   Tana Conch 06/30/2011, 5:23 AM

## 2011-06-30 NOTE — Progress Notes (Signed)
"  I have had diarrhea for about 4 days.  I have had lots of gas everytime I eat.  I am having bad heartburn tonight.  I started throwing up at 0100.  I've vomited 5 times since 0100."

## 2011-07-01 NOTE — ED Provider Notes (Signed)
I supervised the resident and agree with the note above. MUHAMMAD,Fran Mcree 

## 2011-07-08 ENCOUNTER — Ambulatory Visit (INDEPENDENT_AMBULATORY_CARE_PROVIDER_SITE_OTHER): Payer: Medicaid Other | Admitting: Family Medicine

## 2011-07-08 ENCOUNTER — Encounter: Payer: Self-pay | Admitting: Family Medicine

## 2011-07-08 DIAGNOSIS — Z9889 Other specified postprocedural states: Secondary | ICD-10-CM

## 2011-07-08 DIAGNOSIS — O24919 Unspecified diabetes mellitus in pregnancy, unspecified trimester: Secondary | ICD-10-CM

## 2011-07-08 DIAGNOSIS — Z98891 History of uterine scar from previous surgery: Secondary | ICD-10-CM

## 2011-07-08 LAB — POCT URINALYSIS DIP (DEVICE)
Protein, ur: 100 mg/dL — AB
Specific Gravity, Urine: 1.025 (ref 1.005–1.030)
Urobilinogen, UA: 2 mg/dL — ABNORMAL HIGH (ref 0.0–1.0)
pH: 6 (ref 5.0–8.0)

## 2011-07-08 MED ORDER — INSULIN NPH (HUMAN) (ISOPHANE) 100 UNIT/ML ~~LOC~~ SUSP: 13.0000 [IU] | Freq: Every day | SUBCUTANEOUS | Status: DC

## 2011-07-08 NOTE — Patient Instructions (Addendum)
Vaginal Birth After Cesarean Delivery Vaginal birth after Cesarean delivery (VBAC) is giving birth vaginally after previously delivering a baby by a cesarean. In the past, if a woman had a Cesarean delivery, all births afterwards would be done by Cesarean delivery. This is no longer true. It can be safe for the mother to try a vaginal delivery after having a Cesarean. The final decision to have a VBAC or repeat Cesarean delivery should be between the patient and her caregiver. The risks and benefits can be discussed relative to the reason for, and the type of the previous Cesarean delivery. WOMEN WHO PLAN TO HAVE A VBAC SHOULD CHECK WITH THEIR DOCTOR TO BE SURE THAT:  The previous Cesarean was done with a low transverse uterine incision (not a vertical classical incision).   The birth canal is big enough for the baby.   There were no other operations on the uterus.   They will have an electronic fetal monitor (EFM) on at all times during labor.   An operating room would be available and ready in case an emergency Cesarean is needed.   A doctor and surgical nursing staff would be available at all times during labor to be ready to do an emergency Cesarean if necessary. At [facility] there is always an OB doctor, an anesthesia doctor and a baby doctor present in the hospital.  An anesthesiologist would be present in case an emergency Cesarean is needed.   The nursery is prepared and has adequate personnel and necessary equipment available to care for the baby in case of an emergency Cesarean.  BENEFITS OF VBAC:  Shorter stay in the hospital.   Lower delivery, nursery and hospital costs.   Less blood loss and need for blood transfusions.   Less fever and discomfort from major surgery.   Lower risk of blood clots.   Lower risk of infection.   Shorter recovery after going home.   Lower risk of other surgical complications, such as opening of the incision or hernia in the incision.     Decreased risk of injury to other organs.   Decreased risk for having to remove the uterus (hysterectomy).   Decreased risk for the placenta to completely or partially cover the opening of the uterus (placenta previa) with a future pregnancy.   Ability to have a larger family if desired.  RISKS OF A VBAC:  Rupture of the uterus. Or separation of the scar.  Having to remove the uterus (hysterectomy) if it ruptures.   All the complications of major surgery and/or injury to other organs.   Excessive bleeding, blood clots and infection.   Lower Apgar scores (method to evaluate the newborn based on appearance, pulse, grimace, activity, and respiration) and more risks to the baby.   There is a higher risk of uterine rupture if you induce or augment labor.   There is a higher risk of uterine rupture if you use medications to ripen the cervix- we do not use these.Marland Kitchen  VBAC SHOULD NOT BE DONE IF:  The previous Cesarean was done with a vertical (classical) or T-shaped incision, or you do not know what kind of an incision was made.   You had a ruptured uterus.   You had surgery on your uterus.   You have medical or obstetrical problems.   There are problems with the baby.   There were two previous Cesarean deliveries and no vaginal deliveries.  OTHER FACTS TO KNOW ABOUT VBAC:  It is safe to  have an epidural anesthetic with VBAC.   It is safe to turn the baby from a breech position (attempt an external cephalic version).   It is safe to try a VBAC with twins.   Pregnancies later than 40 weeks have not been successful with VBAC.   There is an increased failure rate of a VBAC in obese pregnant women.   There is an increased failure rate with VABC if the baby weighs 8.8 pounds (4000 grams) or more.   There is an increased failure rate if the time between the Cesarean and VBAC is less than 19 months.   There is an increased failure rate if pre-eclampsia is present (high blood  pressure, protein in the urine and swelling of face and extremities).   VBAC is very successful if there was a previous vaginal birth.   VBAC is very successful when the labor starts spontaneously before the due date.   Delivery of VBAC is similar to having a normal spontaneous vaginal delivery.  It is important to discuss VBAC with your caregiver early in the pregnancy so you can understand the risks, benefits and options. It will give you time to decide what is best in your particular case relevant to the reason for your previous Cesarean delivery. It should be understood that medical changes in the mother or pregnancy may occur during the pregnancy, which make it necessary to change you or your caregiver's initial decision. The counseling, concerns and decisions should be documented in the medical record and signed by all parties. Document Released: 12/15/2006 Document Revised: 03/06/2011 Document Reviewed: 08/05/2008 [facility] Patient Information 2012 [place], [place]. Gestational Diabetes Mellitus Gestational diabetes mellitus (GDM) is diabetes that occurs only during pregnancy. This happens when the body cannot properly handle the glucose (sugar) that increases in the blood after eating. During pregnancy, insulin resistance (reduced sensitivity to insulin) occurs because of the release of hormones from the placenta. Usually, the pancreas of pregnant women produces enough insulin to overcome the resistance that occurs. However, in gestational diabetes, the insulin is there but it does not work effectively. If the resistance is severe enough that the pancreas does not produce enough insulin, extra glucose builds up in the blood.  WHO IS AT RISK FOR DEVELOPING GESTATIONAL DIABETES?  Women with a history of diabetes in the family.   Women over age 87.   Women who are overweight.   Women in certain ethnic groups (Hispanic, African American, Native American, [place] and [place]er).  WHAT CAN  HAPPEN TO THE BABY? If the mother's blood glucose is too high while she is pregnant, the extra sugar will travel through the umbilical cord to the baby. Some of the problems the baby may have are:  Large Baby - If the baby receives too much sugar, the baby will gain more weight. This may cause the baby to be too large to be born normally (vaginally) and a Cesarean section (C-section) may be needed.   Low Blood Glucose (hypoglycemia) - The baby makes extra insulin, in response to the extra sugar its gets from its mother. When the baby is born and no longer needs this extra insulin, the baby's blood glucose level may drop.   Jaundice (yellow coloring of the skin and eyes) - This is fairly common in babies. It is caused from a build-up of the chemical called bilirubin. This is rarely serious, but is seen more often in babies whose mothers had gestational diabetes.  RISKS TO THE MOTHER Women who have  had gestational diabetes may be at higher risk for some problems, including:  Preeclampsia or toxemia, which includes problems with high blood pressure. Blood pressure and protein levels in the urine must be checked frequently.   Infections.   Cesarean section (C-section) for delivery.   Developing Type 2 diabetes later in life. About 30-50% will develop diabetes later, especially if obese.  DIAGNOSIS  The hormones that cause insulin resistance are highest at about 24-28 weeks of pregnancy. If symptoms are experienced, they are much like symptoms you would normally expect during pregnancy.  GDM is often diagnosed using a two part method: 1. After 24-28 weeks of pregnancy, the woman drinks a glucose solution and takes a blood test. If the glucose level is high, a second test will be given.  2. Oral Glucose Tolerance Test (OGTT) which is 3 hours long - After not eating overnight, the blood glucose is checked. The woman drinks a glucose solution, and hourly blood glucose tests are taken.  If the woman  has risk factors for GDM, the caregiver may test earlier than 24 weeks of pregnancy. TREATMENT  Treatment of GDM is directed at keeping the mother's blood glucose level normal, and may include:  Meal planning.   Taking insulin or other medicine to control your blood glucose level.   Exercise.   Keeping a daily record of the foods you eat.   Blood glucose monitoring and keeping a record of your blood glucose levels.   May monitor ketone levels in the urine, although this is no longer considered necessary in most pregnancies.  HOME CARE INSTRUCTIONS  While you are pregnant:  Follow your caregiver's advice regarding your prenatal appointments, meal planning, exercise, medicines, vitamins, blood and other tests, and physical activities.   Keep a record of your meals, blood glucose tests, and the amount of insulin you are taking (if any). Show this to your caregiver at every prenatal visit.   If you have GDM, you may have problems with hypoglycemia (low blood glucose). You may suspect this if you become suddenly dizzy, feel shaky, and/or weak. If you think this is happening and you have a glucose meter, try to test your blood glucose level. Follow your caregiver's advice for when and how to treat your low blood glucose. Generally, the 15:15 rule is followed: Treat by consuming 15 grams of carbohydrates, wait 15 minutes, and recheck blood glucose. Examples of 15 grams of carbohydrates are:   1 cup skim or low-fat milk.    cup juice.   3-4 glucose tablets.   5-6 hard candies.   1 small box raisins.    cup regular soda pop.   Practice good hygiene, to avoid infections.   Do not smoke.  SEEK MEDICAL CARE IF:   You develop abnormal vaginal discharge, with or without itching.   You become weak and tired more than expected.   You seem to sweat a lot.   You have a sudden increase in weight, 5 pounds or more in one week.   You are losing weight, 3 pounds or more in a week.    Your blood glucose level is high, and you need instructions on what to do about it.  SEEK IMMEDIATE MEDICAL CARE IF:   You develop a severe headache.   You faint or pass out.   You develop nausea and vomiting.   You become disoriented or confused.   You have a convulsion.   You develop vision problems.   You develop stomach  pain.   You develop vaginal bleeding.   You develop uterine contractions.   You have leaking or a gush of fluid from the vagina.  AFTER YOU HAVE THE BABY:  Go to all of your follow-up appointments, and have blood tests as advised by your caregiver.   Maintain a healthy lifestyle, to prevent diabetes in the future. This includes:   Following a healthy meal plan.   Controlling your weight.   Getting enough exercise and proper rest.   Do not smoke.   Breastfeed your baby if you can. This will lower the chance of you and your baby developing diabetes later in life.  For more information about diabetes, go to the American Diabetes Association at: PMFashions.com.cy. For more information about gestational diabetes, go to the [employer] at: RentRule.com.au. Document Released: 09/30/2000 Document Revised: 03/06/2011 Document Reviewed: 04/24/2009 [facility] Patient Information 2012 [place], [place]. Pregnancy - Third Trimester The third trimester of pregnancy (the last 3 months) is a period of the most rapid growth for you and your baby. The baby approaches a length of 20 inches and a weight of 6 to 10 pounds. The baby is adding on fat and getting ready for life outside your body. While inside, babies have periods of sleeping and waking, suck their thumbs, and hiccups. You can often feel small contractions of the uterus. This is false labor. It is also called Braxton-Hicks contractions. This is like a practice for labor. The usual problems in this stage of pregnancy include more difficulty breathing,  swelling of the hands and feet from water retention, and having to urinate more often because of the uterus and baby pressing on your bladder.  PRENATAL EXAMS  Blood work may continue to be done during prenatal exams. These tests are done to check on your health and the probable health of your baby. Blood work is used to follow your blood levels (hemoglobin). Anemia (low hemoglobin) is common during pregnancy. Iron and vitamins are given to help prevent this. You may also continue to be checked for diabetes. Some of the past blood tests may be done again.   The size of the uterus is measured during each visit. This makes sure your baby is growing properly according to your pregnancy dates.   Your blood pressure is checked every prenatal visit. This is to make sure you are not getting toxemia.   Your urine is checked every prenatal visit for infection, diabetes and protein.   Your weight is checked at each visit. This is done to make sure gains are happening at the suggested rate and that you and your baby are growing normally.   Sometimes, an ultrasound is performed to confirm the position and the proper growth and development of the baby. This is a test done that bounces harmless sound waves off the baby so your caregiver can more accurately determine due dates.   Discuss the type of pain medication and anesthesia you will have during your labor and delivery.   Discuss the possibility and anesthesia if a Cesarean Section might be necessary.   Inform your caregiver if there is any mental or physical violence at home.  Sometimes, a specialized non-stress test, contraction stress test and biophysical profile are done to make sure the baby is not having a problem. Checking the amniotic fluid surrounding the baby is called an amniocentesis. The amniotic fluid is removed by sticking a needle into the belly (abdomen). This is sometimes done near the end  of pregnancy if an early delivery is required. In  this case, it is done to help make sure the baby's lungs are mature enough for the baby to live outside of the womb. If the lungs are not mature and it is unsafe to deliver the baby, an injection of cortisone medication is given to the mother 1 to 2 days before the delivery. This helps the baby's lungs mature and makes it safer to deliver the baby. CHANGES OCCURING IN THE THIRD TRIMESTER OF PREGNANCY Your body goes through many changes during pregnancy. They vary from person to person. Talk to your caregiver about changes you notice and are concerned about.  During the last trimester, you have probably had an increase in your appetite. It is normal to have cravings for certain foods. This varies from person to person and pregnancy to pregnancy.   You may begin to get stretch marks on your hips, abdomen, and breasts. These are normal changes in the body during pregnancy. There are no exercises or medications to take which prevent this change.   Constipation may be treated with a stool softener or adding bulk to your diet. Drinking lots of fluids, fiber in vegetables, fruits, and whole grains are helpful.   Exercising is also helpful. If you have been very active up until your pregnancy, most of these activities can be continued during your pregnancy. If you have been less active, it is helpful to start an exercise program such as walking. Consult your caregiver before starting exercise programs.   Avoid all smoking, alcohol, un-prescribed drugs, herbs and "street drugs" during your pregnancy. These chemicals affect the formation and growth of the baby. Avoid chemicals throughout the pregnancy to ensure the delivery of a healthy infant.   Backache, varicose veins and hemorrhoids may develop or get worse.   You will tire more easily in the third trimester, which is normal.   The baby's movements may be stronger and more often.   You may become short of breath easily.   Your belly button may stick  out.   A yellow discharge may leak from your breasts called colostrum.   You may have a bloody mucus discharge. This usually occurs a few days to a week before labor begins.  HOME CARE INSTRUCTIONS   Keep your caregiver's appointments. Follow your caregiver's instructions regarding medication use, exercise, and diet.   During pregnancy, you are providing food for you and your baby. Continue to eat regular, well-balanced meals. Choose foods such as meat, fish, milk and other low fat dairy products, vegetables, fruits, and whole-grain breads and cereals. Your caregiver will tell you of the ideal weight gain.   A physical sexual relationship may be continued throughout pregnancy if there are no other problems such as early (premature) leaking of amniotic fluid from the membranes, vaginal bleeding, or belly (abdominal) pain.   Exercise regularly if there are no restrictions. Check with your caregiver if you are unsure of the safety of your exercises. Greater weight gain will occur in the last 2 trimesters of pregnancy. Exercising helps:   Control your weight.   Get you in shape for labor and delivery.   You lose weight after you deliver.   Rest a lot with legs elevated, or as needed for leg cramps or low back pain.   Wear a good support or jogging bra for breast tenderness during pregnancy. This may help if worn during sleep. Pads or tissues may be used in the bra if you  are leaking colostrum.   Do not use hot tubs, steam rooms, or saunas.   Wear your seat belt when driving. This protects you and your baby if you are in an accident.   Avoid raw meat, cat litter boxes and soil used by cats. These carry germs that can cause birth defects in the baby.   It is easier to loose urine during pregnancy. Tightening up and strengthening the pelvic muscles will help with this problem. You can practice stopping your urination while you are going to the bathroom. These are the same muscles you need to  strengthen. It is also the muscles you would use if you were trying to stop from passing gas. You can practice tightening these muscles up 10 times a set and repeating this about 3 times per day. Once you know what muscles to tighten up, do not perform these exercises during urination. It is more likely to cause an infection by backing up the urine.   Ask for help if you have financial, counseling or nutritional needs during pregnancy. Your caregiver will be able to offer counseling for these needs as well as refer you for other special needs.   Make a list of emergency phone numbers and have them available.   Plan on getting help from family or friends when you go home from the hospital.   Make a trial run to the hospital.   Take prenatal classes with the father to understand, practice and ask questions about the labor and delivery.   Prepare the baby's room/nursery.   Do not travel out of the city unless it is absolutely necessary and with the advice of your caregiver.   Wear only low or no heal shoes to have better balance and prevent falling.  MEDICATIONS AND DRUG USE IN PREGNANCY  Take prenatal vitamins as directed. The vitamin should contain 1 milligram of folic acid. Keep all vitamins out of reach of children. Only a couple vitamins or tablets containing iron may be fatal to a baby or young child when ingested.   Avoid use of all medications, including herbs, over-the-counter medications, not prescribed or suggested by your caregiver. Only take over-the-counter or prescription medicines for pain, discomfort, or fever as directed by your caregiver. Do not use aspirin, ibuprofen (Motrin, Advil, Nuprin) or naproxen (Aleve) unless OK'd by your caregiver.   Let your caregiver also know about herbs you may be using.   Alcohol is related to a number of birth defects. This includes fetal alcohol syndrome. All alcohol, in any form, should be avoided completely. Smoking will cause low birth  rate and premature babies.   Street/illegal drugs are very harmful to the baby. They are absolutely forbidden. A baby born to an addicted mother will be addicted at birth. The baby will go through the same withdrawal an adult does.  SEEK MEDICAL CARE IF: You have any concerns or worries during your pregnancy. It is better to call with your questions if you feel they cannot wait, rather than worry about them. DECISIONS ABOUT CIRCUMCISION You may or may not know the sex of your baby. If you know your baby is a boy, it may be time to think about circumcision. Circumcision is the removal of the foreskin of the penis. This is the skin that covers the sensitive end of the penis. There is no proven medical need for this. Often this decision is made on what is popular at the time or based upon religious beliefs and social issues.  You can discuss these issues with your caregiver or pediatrician. SEEK IMMEDIATE MEDICAL CARE IF:   An unexplained oral temperature above 102 F (38.9 C) develops, or as your caregiver suggests.   You have leaking of fluid from the vagina (birth canal). If leaking membranes are suspected, take your temperature and tell your caregiver of this when you call.   There is vaginal spotting, bleeding or passing clots. Tell your caregiver of the amount and how many pads are used.   You develop a bad smelling vaginal discharge with a change in the color from clear to white.   You develop vomiting that lasts more than 24 hours.   You develop chills or fever.   You develop shortness of breath.   You develop burning on urination.   You loose more than 2 pounds of weight or gain more than 2 pounds of weight or as suggested by your caregiver.   You notice sudden swelling of your face, hands, and feet or legs.   You develop belly (abdominal) pain. Round ligament discomfort is a common non-cancerous (benign) cause of abdominal pain in pregnancy. Your caregiver still must evaluate you.    You develop a severe headache that does not go away.   You develop visual problems, blurred or double vision.   If you have not felt your baby move for more than 1 hour. If you think the baby is not moving as much as usual, eat something with sugar in it and lie down on your left side for an hour. The baby should move at least 4 to 5 times per hour. Call right away if your baby moves less than that.   You fall, are in a car accident or any kind of trauma.   There is mental or physical violence at home.  Document Released: 06/18/2001 Document Revised: 03/06/2011 Document Reviewed: 12/21/2008 [facility] Patient Information 2012 [place], [place]. Birth Control Choices Birth control is the use of any practices, methods, or devices to prevent pregnancy from happening in a sexually active woman.  Below are some birth control choices to help avoid pregnancy.  Not having sex (abstinence) is the surest form of birth control. This requires self-control. There is no risk of acquiring a sexually transmitted disease (STD), including acquired immunodeficiency syndrome (AIDS).   Periodic abstinence requires self-control during certain times of the month.   Calendar method, timing your menstrual periods from month to month.   Ovulation method is avoiding sexual intercourse around the time you produce an egg (ovulate).   Symptotherm method is avoiding sexual intercourse at the time of ovulation, using a thermometer and ovulation symptoms.   Post ovulation method is the timing of sexual intercourse after you ovulated.  These methods do not protect against STDs, including AIDS.  Birth control pills (BCPs) contain estrogen and progesterone hormone. These medicines work by stopping the egg from forming in the ovary (ovulation). Birth control pills are prescribed by a caregiver who will ask you questions about the risks of taking BCPs. Birth control pills do not protect against STDs, including AIDS.    "Minipill" birth control pills have only the progesterone hormone. They are taken every day of each month and must be prescribed by your caregiver. They do not protect against STDs, including AIDS.   Emergency contraception is often call the "morning after" pill. This pill can be taken right after sex or up to five days after sex if you think your birth control failed, you failed to use  contraception, or you were forced to have sex. It is most effective the sooner you take the pills after having sexual intercourse. Do not use emergency contraception as your only form of birth control. Emergency contraceptive pills are available without a prescription. Check with your pharmacist.   Condoms are a thin sheath of latex, synthetic material, or lambskin worn over the penis during sexual intercourse. They can have a spermicide in or on them when you buy them. Latex condoms can prevent pregnancy and STDs. "Natural" or lambskin condoms can prevent pregnancy but may not protect against STDs, including AIDS.   Female condoms are a soft, loose-fitting sheath that is put into the vagina before sexual intercourse. They can prevent pregnancy and STDs, including AIDS.   Sponge is a soft, circular piece of polyurethane foam with spermicide in it that is inserted into the vagina after wetting it and before sexual intercourse. It does not require a prescription from your caregiver. It does not protect against STDs, including AIDS.   Diaphragm is a soft, latex, dome-shaped barrier that must be fitted by a caregiver. It is inserted into the vagina, along with a spermicidal jelly. After the proper fitting for a diaphragm, always insert the diaphragm before intercourse. The diaphragm should be left in the vagina for 6 to 8 hours after intercourse. Removal and reinsertion with a spermicide is always necessary after any use. It does not protect against STDs, including AIDS.   Progesterone-only injections are given every 3  months to prevent pregnancy. These injections contain synthetic progesterone and no estrogen. This hormone stops the ovaries from releasing eggs. It also causes the cervical mucus to thicken and changes the uterine lining. This makes it harder for sperm to survive in the uterus. It does not protect against STDs, including AIDS.   Birth Control Patch contains hormones similar to those in birth control pills, so effectiveness, risks, and side effects are similar. It must be changed once a week and is prescribed by a caregiver. It is less effective in very overweight women. It does not protect against STDs, including AIDS.   Vaginal Ring contains hormones similar to those in birth control pills. It is left in place for 3 weeks, removed for 1 week, and then a new one is put back into the vagina. It comes with a timer to put in your purse to help you remember when to take it out or put a new one in. A caregiver's examination and prescription is necessary, just like with birth control pills and the patch. It does not protect against STDs, including AIDS.   Estrogen plus progesterone injections are given every 28 to 30 days. They can be given in the upper arm, thigh, or buttocks. It does not protect against STDs, including AIDS.   Intrauterine device (IUD): copper T or progestin filled is a T-shaped device that is put in a woman's uterus during a menstrual period to prevent pregnancy. The copper T IUD can last 10 years, and the progestin IUD can last 5 years. The progestin IUD can also help control heavy menstrual periods. It does not protect against STDs, including AIDS. The copper T IUD can be used as emergency contraception if inserted within 5 days of having unprotected intercourse.   Cervical cap is a round, soft latex or plastic cup that fits over the cervix and must be fitted by a caregiver. You do not need to use a spermicide with it or remove and insert it every time you have  sexual intercourse. It does  not protect against STDs, including AIDS.   Spermicides are chemicals that kill or block sperm from entering the cervix and uterus. They come in the form of creams, jellies, suppositories, foam, or tablets, and they do not require a prescription. They are inserted into the vagina with an applicator before having sexual intercourse. This must be repeated every time you have sexual intercourse.   Withdrawal is using the method of the female withdrawing his penis from sexual intercourse before he has a climax and deposits his sperm. It does not protect against STDs, including AIDS.   Female tubal ligation is when the woman's fallopian tubes are surgically sealed or tied to prevent the egg from traveling to the uterus. It does not protect against STDs, including AIDS.   Female sterilization is when the female has his tubes that carry sperm tied off (vasectomy) to stop sperm from entering the vagina during sexual intercourse. It does not protect against STDs, including AIDS.  Regardless of which method of birth control you choose, it is still important that you use some form of protection against STDs. Document Released: 06/24/2005 Document Revised: 07/27/2010 Document Reviewed: 05/11/2009 [facility] Patient Information 2012 [place], [place]. Breastfeeding BENEFITS OF BREASTFEEDING For the baby  The first milk (colostrum) helps the baby's digestive system function better.   There are antibodies from the mother in the milk that help the baby fight off infections.   The baby has a lower incidence of asthma, allergies, and SIDS (sudden infant death syndrome).   The nutrients in breast milk are better than formulas for the baby and helps the baby's brain grow better.   Babies who breastfeed have less gas, colic, and constipation.  For the mother  Breastfeeding helps develop a very special bond between mother and baby.   It is more convenient, always available at the correct temperature and cheaper than  formula feeding.   It burns calories in the mother and helps with losing weight that was gained during pregnancy.   It makes the uterus contract back down to normal size faster and slows bleeding following delivery.   Breastfeeding mothers have a lower risk of developing breast cancer.  NURSE FREQUENTLY  A healthy, full-term baby may breastfeed as often as every hour or space his or her feedings to every 3 hours.   How often to nurse will vary from baby to baby. Watch your baby for signs of hunger, not the clock.   Nurse as often as the baby requests, or when you feel the need to reduce the fullness of your breasts.   Awaken the baby if it has been 3 to 4 hours since the last feeding.   Frequent feeding will help the mother make more milk and will prevent problems like sore nipples and engorgement of the breasts.  BABY'S POSITION AT THE BREAST  Whether lying down or sitting, be sure that the baby's tummy is facing your tummy.   Support the breast with 4 fingers underneath the breast and the thumb above. Make sure your fingers are well away from the nipple and baby's mouth.   Stroke the baby's lips and cheek closest to the breast gently with your finger or nipple.   When the baby's mouth is open wide enough, place all of your nipple and as much of the dark area around the nipple as possible into your baby's mouth.   Pull the baby in close so the tip of the nose and the baby's  cheeks touch the breast during the feeding.  FEEDINGS  The length of each feeding varies from baby to baby and from feeding to feeding.   The baby must suck about 2 to 3 minutes for your milk to get to him or her. This is called a "let down." For this reason, allow the baby to feed on each breast as long as he or she wants. Your baby will end the feeding when he or she has received the right balance of nutrients.   To break the suction, put your finger into the corner of the baby's mouth and slide it between  his or her gums before removing your breast from his or her mouth. This will help prevent sore nipples.  REDUCING BREAST ENGORGEMENT  In the first week after your baby is born, you may experience signs of breast engorgement. When breasts are engorged, they feel heavy, warm, full, and may be tender to the touch. You can reduce engorgement if you:   Nurse frequently, every 2 to 3 hours. Mothers who breastfeed early and often have fewer problems with engorgement.   Place light ice packs on your breasts between feedings. This reduces swelling. Wrap the ice packs in a lightweight towel to protect your skin.   Apply moist hot packs to your breast for 5 to 10 minutes before each feeding. This increases circulation and helps the milk flow.   Gently massage your breast before and during the feeding.   Make sure that the baby empties at least one breast at every feeding before switching sides.   Use a breast pump to empty the breasts if your baby is sleepy or not nursing well. You may also want to pump if you are returning to work or or you feel you are getting engorged.   Avoid bottle feeds, pacifiers or supplemental feedings of water or juice in place of breastfeeding.   Be sure the baby is latched on and positioned properly while breastfeeding.   Prevent fatigue, stress, and anemia.   Wear a supportive bra, avoiding underwire styles.   Eat a balanced diet with enough fluids.  If you follow these suggestions, your engorgement should improve in 24 to 48 hours. If you are still experiencing difficulty, call your lactation consultant or caregiver. IS MY BABY GETTING ENOUGH MILK? Sometimes, mothers worry about whether their babies are getting enough milk. You can be assured that your baby is getting enough milk if:  The baby is actively sucking and you hear swallowing.   The baby nurses at least 8 to 12 times in a 24 hour time period. Nurse your baby until he or she unlatches or falls asleep at  the first breast (at least 10 to 20 minutes), then offer the second side.   The baby is wetting 5 to 6 disposable diapers (6 to 8 cloth diapers) in a 24 hour period by 83 to 16 days of age.   The baby is having at least 2 to 3 stools every 24 hours for the first few months. Breast milk is all the food your baby needs. It is not necessary for your baby to have water or formula. In fact, to help your breasts make more milk, it is best not to give your baby supplemental feedings during the early weeks.   The stool should be soft and yellow.   The baby should gain 4 to 7 ounces per week after he is 51 days old.  TAKE CARE OF YOURSELF Take care  of your breasts by:  Bathing or showering daily.   Avoiding the use of soaps on your nipples.   Start feedings on your left breast at one feeding and on your right breast at the next feeding.   You will notice an increase in your milk supply 2 to 5 days after delivery. You may feel some discomfort from engorgement, which makes your breasts very firm and often tender. Engorgement "peaks" out within 24 to 48 hours. In the meantime, apply warm moist towels to your breasts for 5 to 10 minutes before feeding. Gentle massage and expression of some milk before feeding will soften your breasts, making it easier for your baby to latch on. Wear a well fitting nursing bra and air dry your nipples for 10 to 15 minutes after each feeding.   Only use cotton bra pads.   Only use pure lanolin on your nipples after nursing. You do not need to wash it off before nursing.  Take care of yourself by:   Eating well-balanced meals and nutritious snacks.   Drinking milk, fruit juice, and water to satisfy your thirst (about 8 glasses a day).   Getting plenty of rest.   Increasing calcium in your diet (1200 mg a day).   Avoiding foods that you notice affect the baby in a bad way.  SEEK MEDICAL CARE IF:   You have any questions or difficulty with breastfeeding.   You need  help.   You have a hard, red, sore area on your breast, accompanied by a fever of 100.5 F (38.1 C) or more.   Your baby is too sleepy to eat well or is having trouble sleeping.   Your baby is wetting less than 6 diapers per day, by 40 days of age.   Your baby's skin or white part of his or her eyes is more yellow than it was in the hospital.   You feel depressed.  Document Released: 06/24/2005 Document Revised: 03/06/2011 Document Reviewed: 02/06/2009 [facility] Patient Information 2012 [place], [place].

## 2011-07-08 NOTE — Progress Notes (Signed)
U/S scheduled 07/15/11 at 945 am.

## 2011-07-08 NOTE — Progress Notes (Signed)
FBS 69-97 2 hr pp 74-157 Out of lancets, few Blood sugars and hard to adjust based on these numbers.  Needs new book.  Needs to start 2x/wk testing.  Cannot stay today.  Last growth 12/3.--will order another.  Risks of uncontrolled GDM discussed, including LGA and IUFD in detail.  Repeat U/S for growth ordered.  Refused antenatal testing today--will bring book with BS on Thursday when she comes for NST. VBAC information given to pt.

## 2011-07-08 NOTE — Progress Notes (Signed)
Pulse: 107 

## 2011-07-15 ENCOUNTER — Ambulatory Visit (HOSPITAL_COMMUNITY)
Admission: RE | Admit: 2011-07-15 | Discharge: 2011-07-15 | Disposition: A | Discharge status: Home / Self Care | Payer: Medicaid Other | Source: Ambulatory Visit | Attending: Family Medicine | Admitting: Family Medicine

## 2011-07-15 ENCOUNTER — Ambulatory Visit (INDEPENDENT_AMBULATORY_CARE_PROVIDER_SITE_OTHER): Payer: Medicaid Other | Admitting: Obstetrics & Gynecology

## 2011-07-15 ENCOUNTER — Other Ambulatory Visit: Payer: Self-pay | Admitting: Family Medicine

## 2011-07-15 VITALS — BP 94/64 | Temp 97.1°F | Wt 188.1 lb

## 2011-07-15 DIAGNOSIS — O24919 Unspecified diabetes mellitus in pregnancy, unspecified trimester: Secondary | ICD-10-CM

## 2011-07-15 DIAGNOSIS — O3660X0 Maternal care for excessive fetal growth, unspecified trimester, not applicable or unspecified: Secondary | ICD-10-CM | POA: Insufficient documentation

## 2011-07-15 DIAGNOSIS — O99810 Abnormal glucose complicating pregnancy: Secondary | ICD-10-CM | POA: Insufficient documentation

## 2011-07-15 LAB — POCT URINALYSIS DIP (DEVICE)
Glucose, UA: 100 mg/dL — AB
Nitrite: NEGATIVE
Protein, ur: 30 mg/dL — AB
Specific Gravity, Urine: 1.02 (ref 1.005–1.030)
Urobilinogen, UA: 4 mg/dL — ABNORMAL HIGH (ref 0.0–1.0)
pH: 6.5 (ref 5.0–8.0)

## 2011-07-15 MED ORDER — SIMETHICONE 80 MG PO CHEW: 80.0000 mg | CHEWABLE_TABLET | Freq: Four times a day (QID) | ORAL | Status: DC | PRN

## 2011-07-15 NOTE — Progress Notes (Signed)
Fast = 79-109 most wnl; 2hr after breakfast 93-134, 2 hr after 96-128,  2 hr after dinner 95-137.  Will increase regular insulin with breakfast to 30 units.  Pt had [place] which showed LGA fetus with >90%.  Discussed results with pt and she does not seem to agree.  She was told with last pregnancy that fetus was LGA and she had c/s.  Weighed 7 lbs 14 ounces.  Pt will stay for NST.  Needs to do 2x week testing.

## 2011-07-15 NOTE — Progress Notes (Signed)
Pulse: 73

## 2011-07-15 NOTE — Patient Instructions (Signed)
Breastfeeding BENEFITS OF BREASTFEEDING For the baby  The first milk (colostrum) helps the baby's digestive system function better.   There are antibodies from the mother in the milk that help the baby fight off infections.   The baby has a lower incidence of asthma, allergies, and SIDS (sudden infant death syndrome).   The nutrients in breast milk are better than formulas for the baby and helps the baby's brain grow better.   Babies who breastfeed have less gas, colic, and constipation.  For the mother  Breastfeeding helps develop a very special bond between mother and baby.   It is more convenient, always available at the correct temperature and cheaper than formula feeding.   It burns calories in the mother and helps with losing weight that was gained during pregnancy.   It makes the uterus contract back down to normal size faster and slows bleeding following delivery.   Breastfeeding mothers have a lower risk of developing breast cancer.  NURSE FREQUENTLY  A healthy, full-term baby may breastfeed as often as every hour or space his or her feedings to every 3 hours.   How often to nurse will vary from baby to baby. Watch your baby for signs of hunger, not the clock.   Nurse as often as the baby requests, or when you feel the need to reduce the fullness of your breasts.   Awaken the baby if it has been 3 to 4 hours since the last feeding.   Frequent feeding will help the mother make more milk and will prevent problems like sore nipples and engorgement of the breasts.  BABY'S POSITION AT THE BREAST  Whether lying down or sitting, be sure that the baby's tummy is facing your tummy.   Support the breast with 4 fingers underneath the breast and the thumb above. Make sure your fingers are well away from the nipple and baby's mouth.   Stroke the baby's lips and cheek closest to the breast gently with your finger or nipple.   When the baby's mouth is open wide enough, place  all of your nipple and as much of the dark area around the nipple as possible into your baby's mouth.   Pull the baby in close so the tip of the nose and the baby's cheeks touch the breast during the feeding.  FEEDINGS  The length of each feeding varies from baby to baby and from feeding to feeding.   The baby must suck about 2 to 3 minutes for your milk to get to him or her. This is called a "let down." For this reason, allow the baby to feed on each breast as long as he or she wants. Your baby will end the feeding when he or she has received the right balance of nutrients.   To break the suction, put your finger into the corner of the baby's mouth and slide it between his or her gums before removing your breast from his or her mouth. This will help prevent sore nipples.  REDUCING BREAST ENGORGEMENT  In the first week after your baby is born, you may experience signs of breast engorgement. When breasts are engorged, they feel heavy, warm, full, and may be tender to the touch. You can reduce engorgement if you:   Nurse frequently, every 2 to 3 hours. Mothers who breastfeed early and often have fewer problems with engorgement.   Place light ice packs on your breasts between feedings. This reduces swelling. Wrap the ice packs in a   lightweight towel to protect your skin.   Apply moist hot packs to your breast for 5 to 10 minutes before each feeding. This increases circulation and helps the milk flow.   Gently massage your breast before and during the feeding.   Make sure that the baby empties at least one breast at every feeding before switching sides.   Use a breast pump to empty the breasts if your baby is sleepy or not nursing well. You may also want to pump if you are returning to work or or you feel you are getting engorged.   Avoid bottle feeds, pacifiers or supplemental feedings of water or juice in place of breastfeeding.   Be sure the baby is latched on and positioned properly while  breastfeeding.   Prevent fatigue, stress, and anemia.   Wear a supportive bra, avoiding underwire styles.   Eat a balanced diet with enough fluids.  If you follow these suggestions, your engorgement should improve in 24 to 48 hours. If you are still experiencing difficulty, call your lactation consultant or caregiver. IS MY BABY GETTING ENOUGH MILK? Sometimes, mothers worry about whether their babies are getting enough milk. You can be assured that your baby is getting enough milk if:  The baby is actively sucking and you hear swallowing.   The baby nurses at least 8 to 12 times in a 24 hour time period. Nurse your baby until he or she unlatches or falls asleep at the first breast (at least 10 to 20 minutes), then offer the second side.   The baby is wetting 5 to 6 disposable diapers (6 to 8 cloth diapers) in a 24 hour period by 5 to 6 days of age.   The baby is having at least 2 to 3 stools every 24 hours for the first few months. Breast milk is all the food your baby needs. It is not necessary for your baby to have water or formula. In fact, to help your breasts make more milk, it is best not to give your baby supplemental feedings during the early weeks.   The stool should be soft and yellow.   The baby should gain 4 to 7 ounces per week after he is 4 days old.  TAKE CARE OF YOURSELF Take care of your breasts by:  Bathing or showering daily.   Avoiding the use of soaps on your nipples.   Start feedings on your left breast at one feeding and on your right breast at the next feeding.   You will notice an increase in your milk supply 2 to 5 days after delivery. You may feel some discomfort from engorgement, which makes your breasts very firm and often tender. Engorgement "peaks" out within 24 to 48 hours. In the meantime, apply warm moist towels to your breasts for 5 to 10 minutes before feeding. Gentle massage and expression of some milk before feeding will soften your breasts, making  it easier for your baby to latch on. Wear a well fitting nursing bra and air dry your nipples for 10 to 15 minutes after each feeding.   Only use cotton bra pads.   Only use pure lanolin on your nipples after nursing. You do not need to wash it off before nursing.  Take care of yourself by:   Eating well-balanced meals and nutritious snacks.   Drinking milk, fruit juice, and water to satisfy your thirst (about 8 glasses a day).   Getting plenty of rest.   Increasing calcium in   your diet (1200 mg a day).   Avoiding foods that you notice affect the baby in a bad way.  SEEK MEDICAL CARE IF:   You have any questions or difficulty with breastfeeding.   You need help.   You have a hard, red, sore area on your breast, accompanied by a fever of 100.5 F (38.1 C) or more.   Your baby is too sleepy to eat well or is having trouble sleeping.   Your baby is wetting less than 6 diapers per day, by 5 days of age.   Your baby's skin or white part of his or her eyes is more yellow than it was in the hospital.   You feel depressed.  Document Released: 06/24/2005 Document Revised: 03/06/2011 Document Reviewed: 02/06/2009 [employer] Patient Information 2012 [employer]. 

## 2011-07-19 ENCOUNTER — Other Ambulatory Visit: Payer: Medicaid Other

## 2011-07-22 ENCOUNTER — Ambulatory Visit (INDEPENDENT_AMBULATORY_CARE_PROVIDER_SITE_OTHER): Payer: Medicaid Other | Admitting: Obstetrics and Gynecology

## 2011-07-22 VITALS — BP 100/71 | Temp 97.1°F | Wt 188.9 lb

## 2011-07-22 DIAGNOSIS — Z98891 History of uterine scar from previous surgery: Secondary | ICD-10-CM

## 2011-07-22 DIAGNOSIS — Z9889 Other specified postprocedural states: Secondary | ICD-10-CM

## 2011-07-22 DIAGNOSIS — O24919 Unspecified diabetes mellitus in pregnancy, unspecified trimester: Secondary | ICD-10-CM

## 2011-07-22 DIAGNOSIS — O0990 Supervision of high risk pregnancy, unspecified, unspecified trimester: Secondary | ICD-10-CM

## 2011-07-22 LAB — POCT URINALYSIS DIP (DEVICE)
Nitrite: NEGATIVE
Protein, ur: 30 mg/dL — AB
pH: 6 (ref 5.0–8.0)

## 2011-07-22 NOTE — Progress Notes (Signed)
Swelling in left ankle and foot. No vaginal discharge. Pulse 82.

## 2011-07-22 NOTE — Progress Notes (Signed)
NST of 1/14 reviewed -category I. CBG fasting 73-115 (2/7 abnormal) 2hr p Bk 133, 138 (only 2 values); 2hr p L 76-137 (2/6 abnormal); 2hr p D 75-171 (2/6 abnormal). Diabetic diet reviewed. Patient encouraged to exercise regularly 30 min/day (walking) and to monitor post prandial cbg. Results of ultrasound again reviewed with patient in the setting of her diagnosis of DM and desire to VBAC. Patient verbalized understanding.

## 2011-07-26 ENCOUNTER — Other Ambulatory Visit: Payer: Medicaid Other

## 2011-07-29 ENCOUNTER — Ambulatory Visit (INDEPENDENT_AMBULATORY_CARE_PROVIDER_SITE_OTHER): Payer: Medicaid Other | Admitting: Family Medicine

## 2011-07-29 VITALS — BP 107/75 | HR 116 | Temp 97.2°F | Wt 189.4 lb

## 2011-07-29 DIAGNOSIS — O24919 Unspecified diabetes mellitus in pregnancy, unspecified trimester: Secondary | ICD-10-CM

## 2011-07-29 DIAGNOSIS — O3660X0 Maternal care for excessive fetal growth, unspecified trimester, not applicable or unspecified: Secondary | ICD-10-CM

## 2011-07-29 LAB — POCT URINALYSIS DIP (DEVICE)
Ketones, ur: NEGATIVE mg/dL
Protein, ur: 30 mg/dL — AB
Urobilinogen, UA: 1 mg/dL (ref 0.0–1.0)
pH: 6 (ref 5.0–8.0)

## 2011-07-29 MED ORDER — INSULIN NPH (HUMAN) (ISOPHANE) 100 UNIT/ML ~~LOC~~ SUSP: 15.0000 [IU] | Freq: Every day | SUBCUTANEOUS | Status: DC

## 2011-07-29 NOTE — Patient Instructions (Addendum)
Vaginal Birth After Cesarean Delivery Vaginal birth after Cesarean delivery (VBAC) is giving birth vaginally after previously delivering a baby by a cesarean. In the past, if a woman had a Cesarean delivery, all births afterwards would be done by Cesarean delivery. This is no longer true. It can be safe for the mother to try a vaginal delivery after having a Cesarean. The final decision to have a VBAC or repeat Cesarean delivery should be between the patient and her caregiver. The risks and benefits can be discussed relative to the reason for, and the type of the previous Cesarean delivery. WOMEN WHO PLAN TO HAVE A VBAC SHOULD CHECK WITH THEIR DOCTOR TO BE SURE THAT:  The previous Cesarean was done with a low transverse uterine incision (not a vertical classical incision).   The birth canal is big enough for the baby.   There were no other operations on the uterus.   They will have an electronic fetal monitor (EFM) on at all times during labor.   An operating room would be available and ready in case an emergency Cesarean is needed.   A doctor and surgical nursing staff would be available at all times during labor to be ready to do an emergency Cesarean if necessary.   An anesthesiologist would be present in case an emergency Cesarean is needed.   The nursery is prepared and has adequate personnel and necessary equipment available to care for the baby in case of an emergency Cesarean.  BENEFITS OF VBAC:  Shorter stay in the hospital.   Lower delivery, nursery and hospital costs.   Less blood loss and need for blood transfusions.   Less fever and discomfort from major surgery.   Lower risk of blood clots.   Lower risk of infection.   Shorter recovery after going home.   Lower risk of other surgical complications, such as opening of the incision or hernia in the incision.   Decreased risk of injury to other organs.   Decreased risk for having to remove the uterus (hysterectomy).     Decreased risk for the placenta to completely or partially cover the opening of the uterus (placenta previa) with a future pregnancy.   Ability to have a larger family if desired.  RISKS OF A VBAC:  Rupture of the uterus.   Having to remove the uterus (hysterectomy) if it ruptures.   All the complications of major surgery and/or injury to other organs.   Excessive bleeding, blood clots and infection.   Lower Apgar scores (method to evaluate the newborn based on appearance, pulse, grimace, activity, and respiration) and more risks to the baby.   There is a higher risk of uterine rupture if you induce or augment labor.   There is a higher risk of uterine rupture if you use medications to ripen the cervix.  VBAC SHOULD NOT BE DONE IF:  The previous Cesarean was done with a vertical (classical) or T-shaped incision, or you do not know what kind of an incision was made.   You had a ruptured uterus.   You had surgery on your uterus.   You have medical or obstetrical problems.   There are problems with the baby.   There were two previous Cesarean deliveries and no vaginal deliveries.  OTHER FACTS TO KNOW ABOUT VBAC:  It is safe to have an epidural anesthetic with VBAC.   It is safe to turn the baby from a breech position (attempt an external cephalic version).   It is  safe to try a VBAC with twins.   Pregnancies later than 40 weeks have not been successful with VBAC.   There is an increased failure rate of a VBAC in obese pregnant women.   There is an increased failure rate with VABC if the baby weighs 8.8 pounds (4000 grams) or more.   There is an increased failure rate if the time between the Cesarean and VBAC is less than 19 months.   There is an increased failure rate if pre-eclampsia is present (high blood pressure, protein in the urine and swelling of face and extremities).   VBAC is very successful if there was a previous vaginal birth.   VBAC is very successful  when the labor starts spontaneously before the due date.   Delivery of VBAC is similar to having a normal spontaneous vaginal delivery.  It is important to discuss VBAC with your caregiver early in the pregnancy so you can understand the risks, benefits and options. It will give you time to decide what is best in your particular case relevant to the reason for your previous Cesarean delivery. It should be understood that medical changes in the mother or pregnancy may occur during the pregnancy, which make it necessary to change you or your caregiver's initial decision. The counseling, concerns and decisions should be documented in the medical record and signed by all parties. Document Released: 12/15/2006 Document Revised: 03/06/2011 Document Reviewed: 08/05/2008 [facility] Patient Information 2012 [place], [place]. Gestational Diabetes Mellitus Gestational diabetes mellitus (GDM) is diabetes that occurs only during pregnancy. This happens when the body cannot properly handle the glucose (sugar) that increases in the blood after eating. During pregnancy, insulin resistance (reduced sensitivity to insulin) occurs because of the release of hormones from the placenta. Usually, the pancreas of pregnant women produces enough insulin to overcome the resistance that occurs. However, in gestational diabetes, the insulin is there but it does not work effectively. If the resistance is severe enough that the pancreas does not produce enough insulin, extra glucose builds up in the blood.  WHO IS AT RISK FOR DEVELOPING GESTATIONAL DIABETES?  Women with a history of diabetes in the family.   Women over age 30.   Women who are overweight.   Women in certain ethnic groups (Hispanic, African American, Native American, [place] and [place]er).  WHAT CAN HAPPEN TO THE BABY? If the mother's blood glucose is too high while she is pregnant, the extra sugar will travel through the umbilical cord to the baby. Some of the  problems the baby may have are:  Large Baby - If the baby receives too much sugar, the baby will gain more weight. This may cause the baby to be too large to be born normally (vaginally) and a Cesarean section (C-section) may be needed.   Low Blood Glucose (hypoglycemia) - The baby makes extra insulin, in response to the extra sugar its gets from its mother. When the baby is born and no longer needs this extra insulin, the baby's blood glucose level may drop.   Jaundice (yellow coloring of the skin and eyes) - This is fairly common in babies. It is caused from a build-up of the chemical called bilirubin. This is rarely serious, but is seen more often in babies whose mothers had gestational diabetes.  RISKS TO THE MOTHER Women who have had gestational diabetes may be at higher risk for some problems, including:  Preeclampsia or toxemia, which includes problems with high blood pressure. Blood pressure and protein levels  in the urine must be checked frequently.   Infections.   Cesarean section (C-section) for delivery.   Developing Type 2 diabetes later in life. About 30-50% will develop diabetes later, especially if obese.  DIAGNOSIS  The hormones that cause insulin resistance are highest at about 24-28 weeks of pregnancy. If symptoms are experienced, they are much like symptoms you would normally expect during pregnancy.  GDM is often diagnosed using a two part method: 1. After 24-28 weeks of pregnancy, the woman drinks a glucose solution and takes a blood test. If the glucose level is high, a second test will be given.  2. Oral Glucose Tolerance Test (OGTT) which is 3 hours long - After not eating overnight, the blood glucose is checked. The woman drinks a glucose solution, and hourly blood glucose tests are taken.  If the woman has risk factors for GDM, the caregiver may test earlier than 24 weeks of pregnancy. TREATMENT  Treatment of GDM is directed at keeping the mother's blood glucose  level normal, and may include:  Meal planning.   Taking insulin or other medicine to control your blood glucose level.   Exercise.   Keeping a daily record of the foods you eat.   Blood glucose monitoring and keeping a record of your blood glucose levels.   May monitor ketone levels in the urine, although this is no longer considered necessary in most pregnancies.  HOME CARE INSTRUCTIONS  While you are pregnant:  Follow your caregiver's advice regarding your prenatal appointments, meal planning, exercise, medicines, vitamins, blood and other tests, and physical activities.   Keep a record of your meals, blood glucose tests, and the amount of insulin you are taking (if any). Show this to your caregiver at every prenatal visit.   If you have GDM, you may have problems with hypoglycemia (low blood glucose). You may suspect this if you become suddenly dizzy, feel shaky, and/or weak. If you think this is happening and you have a glucose meter, try to test your blood glucose level. Follow your caregiver's advice for when and how to treat your low blood glucose. Generally, the 15:15 rule is followed: Treat by consuming 15 grams of carbohydrates, wait 15 minutes, and recheck blood glucose. Examples of 15 grams of carbohydrates are:   1 cup skim or low-fat milk.    cup juice.   3-4 glucose tablets.   5-6 hard candies.   1 small box raisins.    cup regular soda pop.   Practice good hygiene, to avoid infections.   Do not smoke.  SEEK MEDICAL CARE IF:   You develop abnormal vaginal discharge, with or without itching.   You become weak and tired more than expected.   You seem to sweat a lot.   You have a sudden increase in weight, 5 pounds or more in one week.   You are losing weight, 3 pounds or more in a week.   Your blood glucose level is high, and you need instructions on what to do about it.  SEEK IMMEDIATE MEDICAL CARE IF:   You develop a severe headache.   You faint  or pass out.   You develop nausea and vomiting.   You become disoriented or confused.   You have a convulsion.   You develop vision problems.   You develop stomach pain.   You develop vaginal bleeding.   You develop uterine contractions.   You have leaking or a gush of fluid from the vagina.  AFTER  YOU HAVE THE BABY:  Go to all of your follow-up appointments, and have blood tests as advised by your caregiver.   Maintain a healthy lifestyle, to prevent diabetes in the future. This includes:   Following a healthy meal plan.   Controlling your weight.   Getting enough exercise and proper rest.   Do not smoke.   Breastfeed your baby if you can. This will lower the chance of you and your baby developing diabetes later in life.  For more information about diabetes, go to the American Diabetes Association at: PMFashions.com.cy. For more information about gestational diabetes, go to the [employer] at: RentRule.com.au. Document Released: 09/30/2000 Document Revised: 03/06/2011 Document Reviewed: 04/24/2009 [facility] Patient Information 2012 [place], [place]. Birth Control Choices Birth control is the use of any practices, methods, or devices to prevent pregnancy from happening in a sexually active woman.  Below are some birth control choices to help avoid pregnancy.  Not having sex (abstinence) is the surest form of birth control. This requires self-control. There is no risk of acquiring a sexually transmitted disease (STD), including acquired immunodeficiency syndrome (AIDS).   Periodic abstinence requires self-control during certain times of the month.   Calendar method, timing your menstrual periods from month to month.   Ovulation method is avoiding sexual intercourse around the time you produce an egg (ovulate).   Symptotherm method is avoiding sexual intercourse at the time of ovulation, using a thermometer and  ovulation symptoms.   Post ovulation method is the timing of sexual intercourse after you ovulated.  These methods do not protect against STDs, including AIDS.  Birth control pills (BCPs) contain estrogen and progesterone hormone. These medicines work by stopping the egg from forming in the ovary (ovulation). Birth control pills are prescribed by a caregiver who will ask you questions about the risks of taking BCPs. Birth control pills do not protect against STDs, including AIDS.   "Minipill" birth control pills have only the progesterone hormone. They are taken every day of each month and must be prescribed by your caregiver. They do not protect against STDs, including AIDS.   Emergency contraception is often call the "morning after" pill. This pill can be taken right after sex or up to five days after sex if you think your birth control failed, you failed to use contraception, or you were forced to have sex. It is most effective the sooner you take the pills after having sexual intercourse. Do not use emergency contraception as your only form of birth control. Emergency contraceptive pills are available without a prescription. Check with your pharmacist.   Condoms are a thin sheath of latex, synthetic material, or lambskin worn over the penis during sexual intercourse. They can have a spermicide in or on them when you buy them. Latex condoms can prevent pregnancy and STDs. "Natural" or lambskin condoms can prevent pregnancy but may not protect against STDs, including AIDS.   Female condoms are a soft, loose-fitting sheath that is put into the vagina before sexual intercourse. They can prevent pregnancy and STDs, including AIDS.   Sponge is a soft, circular piece of polyurethane foam with spermicide in it that is inserted into the vagina after wetting it and before sexual intercourse. It does not require a prescription from your caregiver. It does not protect against STDs, including AIDS.    Diaphragm is a soft, latex, dome-shaped barrier that must be fitted by a caregiver. It is inserted into the vagina, along with a  spermicidal jelly. After the proper fitting for a diaphragm, always insert the diaphragm before intercourse. The diaphragm should be left in the vagina for 6 to 8 hours after intercourse. Removal and reinsertion with a spermicide is always necessary after any use. It does not protect against STDs, including AIDS.   Progesterone-only injections are given every 3 months to prevent pregnancy. These injections contain synthetic progesterone and no estrogen. This hormone stops the ovaries from releasing eggs. It also causes the cervical mucus to thicken and changes the uterine lining. This makes it harder for sperm to survive in the uterus. It does not protect against STDs, including AIDS.   Birth Control Patch contains hormones similar to those in birth control pills, so effectiveness, risks, and side effects are similar. It must be changed once a week and is prescribed by a caregiver. It is less effective in very overweight women. It does not protect against STDs, including AIDS.   Vaginal Ring contains hormones similar to those in birth control pills. It is left in place for 3 weeks, removed for 1 week, and then a new one is put back into the vagina. It comes with a timer to put in your purse to help you remember when to take it out or put a new one in. A caregiver's examination and prescription is necessary, just like with birth control pills and the patch. It does not protect against STDs, including AIDS.   Estrogen plus progesterone injections are given every 28 to 30 days. They can be given in the upper arm, thigh, or buttocks. It does not protect against STDs, including AIDS.   Intrauterine device (IUD): copper T or progestin filled is a T-shaped device that is put in a woman's uterus during a menstrual period to prevent pregnancy. The copper T IUD can last 10 years, and  the progestin IUD can last 5 years. The progestin IUD can also help control heavy menstrual periods. It does not protect against STDs, including AIDS. The copper T IUD can be used as emergency contraception if inserted within 5 days of having unprotected intercourse.   Cervical cap is a round, soft latex or plastic cup that fits over the cervix and must be fitted by a caregiver. You do not need to use a spermicide with it or remove and insert it every time you have sexual intercourse. It does not protect against STDs, including AIDS.   Spermicides are chemicals that kill or block sperm from entering the cervix and uterus. They come in the form of creams, jellies, suppositories, foam, or tablets, and they do not require a prescription. They are inserted into the vagina with an applicator before having sexual intercourse. This must be repeated every time you have sexual intercourse.   Withdrawal is using the method of the female withdrawing his penis from sexual intercourse before he has a climax and deposits his sperm. It does not protect against STDs, including AIDS.   Female tubal ligation is when the woman's fallopian tubes are surgically sealed or tied to prevent the egg from traveling to the uterus. It does not protect against STDs, including AIDS.   Female sterilization is when the female has his tubes that carry sperm tied off (vasectomy) to stop sperm from entering the vagina during sexual intercourse. It does not protect against STDs, including AIDS.  Regardless of which method of birth control you choose, it is still important that you use some form of protection against STDs. Document Released: 06/24/2005 Document Revised:  07/27/2010 Document Reviewed: 05/11/2009 [facility] Patient Information 2012 [place], [place]. Breastfeeding BENEFITS OF BREASTFEEDING For the baby  The first milk (colostrum) helps the baby's digestive system function better.   There are antibodies from the mother in the milk  that help the baby fight off infections.   The baby has a lower incidence of asthma, allergies, and SIDS (sudden infant death syndrome).   The nutrients in breast milk are better than formulas for the baby and helps the baby's brain grow better.   Babies who breastfeed have less gas, colic, and constipation.  For the mother  Breastfeeding helps develop a very special bond between mother and baby.   It is more convenient, always available at the correct temperature and cheaper than formula feeding.   It burns calories in the mother and helps with losing weight that was gained during pregnancy.   It makes the uterus contract back down to normal size faster and slows bleeding following delivery.   Breastfeeding mothers have a lower risk of developing breast cancer.  NURSE FREQUENTLY  A healthy, full-term baby may breastfeed as often as every hour or space his or her feedings to every 3 hours.   How often to nurse will vary from baby to baby. Watch your baby for signs of hunger, not the clock.   Nurse as often as the baby requests, or when you feel the need to reduce the fullness of your breasts.   Awaken the baby if it has been 3 to 4 hours since the last feeding.   Frequent feeding will help the mother make more milk and will prevent problems like sore nipples and engorgement of the breasts.  BABY'S POSITION AT THE BREAST  Whether lying down or sitting, be sure that the baby's tummy is facing your tummy.   Support the breast with 4 fingers underneath the breast and the thumb above. Make sure your fingers are well away from the nipple and baby's mouth.   Stroke the baby's lips and cheek closest to the breast gently with your finger or nipple.   When the baby's mouth is open wide enough, place all of your nipple and as much of the dark area around the nipple as possible into your baby's mouth.   Pull the baby in close so the tip of the nose and the baby's cheeks touch the breast  during the feeding.  FEEDINGS  The length of each feeding varies from baby to baby and from feeding to feeding.   The baby must suck about 2 to 3 minutes for your milk to get to him or her. This is called a "let down." For this reason, allow the baby to feed on each breast as long as he or she wants. Your baby will end the feeding when he or she has received the right balance of nutrients.   To break the suction, put your finger into the corner of the baby's mouth and slide it between his or her gums before removing your breast from his or her mouth. This will help prevent sore nipples.  REDUCING BREAST ENGORGEMENT  In the first week after your baby is born, you may experience signs of breast engorgement. When breasts are engorged, they feel heavy, warm, full, and may be tender to the touch. You can reduce engorgement if you:   Nurse frequently, every 2 to 3 hours. Mothers who breastfeed early and often have fewer problems with engorgement.   Place light ice packs on your breasts  between feedings. This reduces swelling. Wrap the ice packs in a lightweight towel to protect your skin.   Apply moist hot packs to your breast for 5 to 10 minutes before each feeding. This increases circulation and helps the milk flow.   Gently massage your breast before and during the feeding.   Make sure that the baby empties at least one breast at every feeding before switching sides.   Use a breast pump to empty the breasts if your baby is sleepy or not nursing well. You may also want to pump if you are returning to work or or you feel you are getting engorged.   Avoid bottle feeds, pacifiers or supplemental feedings of water or juice in place of breastfeeding.   Be sure the baby is latched on and positioned properly while breastfeeding.   Prevent fatigue, stress, and anemia.   Wear a supportive bra, avoiding underwire styles.   Eat a balanced diet with enough fluids.  If you follow these suggestions,  your engorgement should improve in 24 to 48 hours. If you are still experiencing difficulty, call your lactation consultant or caregiver. IS MY BABY GETTING ENOUGH MILK? Sometimes, mothers worry about whether their babies are getting enough milk. You can be assured that your baby is getting enough milk if:  The baby is actively sucking and you hear swallowing.   The baby nurses at least 8 to 12 times in a 24 hour time period. Nurse your baby until he or she unlatches or falls asleep at the first breast (at least 10 to 20 minutes), then offer the second side.   The baby is wetting 5 to 6 disposable diapers (6 to 8 cloth diapers) in a 24 hour period by 80 to 75 days of age.   The baby is having at least 2 to 3 stools every 24 hours for the first few months. Breast milk is all the food your baby needs. It is not necessary for your baby to have water or formula. In fact, to help your breasts make more milk, it is best not to give your baby supplemental feedings during the early weeks.   The stool should be soft and yellow.   The baby should gain 4 to 7 ounces per week after he is 4 days old.  TAKE CARE OF YOURSELF Take care of your breasts by:  Bathing or showering daily.   Avoiding the use of soaps on your nipples.   Start feedings on your left breast at one feeding and on your right breast at the next feeding.   You will notice an increase in your milk supply 2 to 5 days after delivery. You may feel some discomfort from engorgement, which makes your breasts very firm and often tender. Engorgement "peaks" out within 24 to 48 hours. In the meantime, apply warm moist towels to your breasts for 5 to 10 minutes before feeding. Gentle massage and expression of some milk before feeding will soften your breasts, making it easier for your baby to latch on. Wear a well fitting nursing bra and air dry your nipples for 10 to 15 minutes after each feeding.   Only use cotton bra pads.   Only use pure  lanolin on your nipples after nursing. You do not need to wash it off before nursing.  Take care of yourself by:   Eating well-balanced meals and nutritious snacks.   Drinking milk, fruit juice, and water to satisfy your thirst (about 8 glasses a day).  Getting plenty of rest.   Increasing calcium in your diet (1200 mg a day).   Avoiding foods that you notice affect the baby in a bad way.  SEEK MEDICAL CARE IF:   You have any questions or difficulty with breastfeeding.   You need help.   You have a hard, red, sore area on your breast, accompanied by a fever of 100.5 F (38.1 C) or more.   Your baby is too sleepy to eat well or is having trouble sleeping.   Your baby is wetting less than 6 diapers per day, by 37 days of age.   Your baby's skin or white part of his or her eyes is more yellow than it was in the hospital.   You feel depressed.  Document Released: 06/24/2005 Document Revised: 03/06/2011 Document Reviewed: 02/06/2009 [facility] Patient Information 2012 [place], [place].

## 2011-07-29 NOTE — Progress Notes (Signed)
FBS-66-110, 5 of 7 out of range--will increase NPH at bed time. 2hr pp-82-169, 6 of 23 out of range. NST reviewed and reactive.

## 2011-07-29 NOTE — Progress Notes (Signed)
Traces of edema in feet.

## 2011-08-02 ENCOUNTER — Other Ambulatory Visit: Payer: Medicaid Other

## 2011-08-05 ENCOUNTER — Ambulatory Visit (INDEPENDENT_AMBULATORY_CARE_PROVIDER_SITE_OTHER): Payer: Medicaid Other | Admitting: Obstetrics and Gynecology

## 2011-08-05 DIAGNOSIS — O24919 Unspecified diabetes mellitus in pregnancy, unspecified trimester: Secondary | ICD-10-CM

## 2011-08-05 DIAGNOSIS — O3660X0 Maternal care for excessive fetal growth, unspecified trimester, not applicable or unspecified: Secondary | ICD-10-CM

## 2011-08-05 DIAGNOSIS — O0990 Supervision of high risk pregnancy, unspecified, unspecified trimester: Secondary | ICD-10-CM

## 2011-08-05 DIAGNOSIS — Z98891 History of uterine scar from previous surgery: Secondary | ICD-10-CM

## 2011-08-05 LAB — POCT URINALYSIS DIP (DEVICE)
Nitrite: NEGATIVE
Protein, ur: 30 mg/dL — AB
Specific Gravity, Urine: 1.025 (ref 1.005–1.030)
Urobilinogen, UA: 1 mg/dL (ref 0.0–1.0)

## 2011-08-05 NOTE — Progress Notes (Signed)
1/28 NST reviewed- category I. Fasting 65-107 (1 abnormal) 2hr pL 54-124 (1 abnormal)  2hr pD 77-142 (1 abnormal).  FM/labor precautions reviewed. Will schedule induction of labor at 39 weeks on 2/16. Will schedule f/u growth [place] at 38 weeks. Cultures collected

## 2011-08-05 NOTE — Progress Notes (Signed)
P=94, c/o pelvic pressure that started last night,

## 2011-08-08 LAB — CULTURE, BETA STREP (GROUP B ONLY)

## 2011-08-09 ENCOUNTER — Ambulatory Visit (INDEPENDENT_AMBULATORY_CARE_PROVIDER_SITE_OTHER): Payer: Medicaid Other | Admitting: *Deleted

## 2011-08-09 DIAGNOSIS — O24919 Unspecified diabetes mellitus in pregnancy, unspecified trimester: Secondary | ICD-10-CM

## 2011-08-09 MED ORDER — ACCU-CHEK FASTCLIX LANCETS MISC: 1.0000 | Freq: Four times a day (QID) | Status: DC

## 2011-08-09 NOTE — Progress Notes (Signed)
NST performed on 08/09/2011 was reviewed and was found to be reactive.  Continue recommended antenatal testing and prenatal care. 

## 2011-08-09 NOTE — Progress Notes (Signed)
P = 82   IOL scheduled on 08/24/11 @ 0730

## 2011-08-12 ENCOUNTER — Telehealth (HOSPITAL_COMMUNITY): Payer: Self-pay | Admitting: *Deleted

## 2011-08-12 ENCOUNTER — Encounter (HOSPITAL_COMMUNITY): Payer: Self-pay | Admitting: *Deleted

## 2011-08-12 ENCOUNTER — Encounter: Payer: Self-pay | Admitting: Family Medicine

## 2011-08-12 ENCOUNTER — Ambulatory Visit (INDEPENDENT_AMBULATORY_CARE_PROVIDER_SITE_OTHER): Payer: Medicaid Other | Admitting: Family Medicine

## 2011-08-12 DIAGNOSIS — O24919 Unspecified diabetes mellitus in pregnancy, unspecified trimester: Secondary | ICD-10-CM

## 2011-08-12 DIAGNOSIS — O3660X0 Maternal care for excessive fetal growth, unspecified trimester, not applicable or unspecified: Secondary | ICD-10-CM

## 2011-08-12 LAB — POCT URINALYSIS DIP (DEVICE)
Bilirubin Urine: NEGATIVE
Glucose, UA: NEGATIVE mg/dL
Ketones, ur: NEGATIVE mg/dL
Specific Gravity, Urine: 1.015 (ref 1.005–1.030)
Specific Gravity, Urine: 1.02 (ref 1.005–1.030)
pH: 6 (ref 5.0–8.0)

## 2011-08-12 MED ORDER — AMOXICILLIN-POT CLAVULANATE 875-125 MG PO TABS: 1.0000 | ORAL_TABLET | Freq: Two times a day (BID) | ORAL | Status: AC

## 2011-08-12 MED ORDER — GLUCOSE BLOOD VI STRP: ORAL_STRIP | Status: DC

## 2011-08-12 NOTE — Progress Notes (Signed)
Addended by: Jill Side on: 08/12/2011 10:18 AM   Modules accepted: Orders

## 2011-08-12 NOTE — Patient Instructions (Signed)
Labor Induction A pregnant woman usually goes into labor spontaneously before the birth of her baby. Most babies are born between 37 and 42 weeks of the pregnancy. When this does not happen, caregivers may use medication or other methods to bring on (induce) labor. Labor induction causes a pregnant woman's uterus to contract, the cervix to open (dilate) and thin out (efface) to prepare for the vaginal birth of her baby. Several methods of labor induction may be used such as:  Massaging the nipple and areola of the breasts (nipple stimulation).   Prostaglandin medication used orally or as a vaginal cream.   Striping of membranes (your caregiver inserts a finger between the cervix and membranes around the baby's head) causes the body to produce prostaglandins that soften the cervix and cause the uterus to contract.   Rupture of the water bag (amniotomy).   Oxytocin by IV.   Special dilators placed into the cervical canal that causes the cervix to soften and open.   Mechanical devices to stretch open the cervix such as, a dilated foley catheter.  Whether your labor will be induced depends on the condition of you and your baby, how far along you are, are the baby's lung maturity, the condition of the cervix, the way the baby is lying, and other factors. Usually, labor is not induced before 39 weeks of the pregnancy unless there is a problem with the baby or mother, and it becomes necessary to induce labor. REASONS LABOR SHOULD BE INDUCED  The health of the baby or mother has become at risk.   The pregnancy is overdue by 2 weeks or more.   Your water breaks (premature rupture of membranes), the baby's lungs are mature, and labor does not start on its own.   You develop high blood pressure (toxemia of pregnancy).   You develop an infection in your uterus.   You have diabetes or other serious medical illness.   Amniotic fluid amounts are small around the baby.   Your placenta begins to  separate from the inner wall of the uterus before the baby is born (placental abruption). This condition may cause you to have an emergency Cesarean delivery.   You have fetal death.   A social induction is also known as an induction for convenience. Most of the time, labor is induced for sound medical reasons. Sometimes, it is done as a convenience. Living a long way from the hospital or having a history of very rapid labors may be reasons the mother may want to induce delivery.  REASONS LABOR SHOULD NOT BE INDUCED  You have had previous surgeries on your uterus. This is especially true if the surgeries went into the inside lining and cavity of the uterus. This gives an added risk for rupturing the uterus.   You have placenta previa. This means your placenta lies very low in the uterus and blocks the opening (cervix) for the baby to get out.   Your baby is not in a head down position. For example, if your baby lies across your uterus (transverse) instead of head first.   If the umbilical cord drops down into the birth canal in front of your baby. This could cut off the baby's blood supply and oxygen to the baby.  RISKS AND COMPLICATIONS Problems seldom occur with labor induction, but there can be some complications. Some of the risks of induction include:  Change in fetal heart rate (too high, too low or irradic).   Increased risk of   a premature baby, even if you think your baby is term.   Increased risk of fetal distress. This means your baby gets into problems during induction. This can be caused by the umbilical cord coming out in front of the baby or is being squeezed.   Increased risk of infection to mother and baby.   Increased chance of having a Cesarean delivery. This is an operation on your belly (abdomen) to remove the baby.   Strong contractions can lead to abruption. This is a separating of the placenta from the uterus.   Uterine rupture, especially if you had a previous  Cesarean or surgery on your uterus.  When labor is induced because of medical problems, other risks may be present. Induced labor may lead to:  Increased use of medications for pain relief.   Other interventions.  When induction is needed for medical reasons, the benefits of induction may outweigh the risks. PROCEDURE It can sometimes take up to 2 or 3 days to induce labor. It usually takes less time. It takes longer when you are induced early in the pregnancy and for first pregnancies.  Before coming to the hospital for an induction:  Do not eat much before you come to the hospital (for at least 8 hours).   Do not eat after midnight if you are going to be induced the next morning.   Be aware that medications for labor induction can upset your stomach.   Let your caregiver know if you need medications for pain.  HOME CARE INSTRUCTIONS If you have been induced in your caregiver's office to start labor, and are allowed to go home, follow the instructions given to you by your care giver. SEEK IMMEDIATE MEDICAL CARE IF:  You develop any kind of vaginal bleeding.   You develop contractions that are severe and continuous.   You feel faint or feel light headed.   You do not develop contractions within the time your caregiver suggests you should.   You begin to run a temperature of 100 F (37.8 C) or develop chills.   You no longer feel the normal fetal movement.  Document Released: 11/13/2006 Document Revised: 03/06/2011 Document Reviewed: 03/03/2009 [employer] Patient Information 2012 [employer]. 

## 2011-08-12 NOTE — Progress Notes (Signed)
NST - category 1 with baseline 130s. Left otitis.  Augmentin bid 7 days.

## 2011-08-12 NOTE — Progress Notes (Signed)
Edema- feet.  Pulse- 80

## 2011-08-12 NOTE — Telephone Encounter (Signed)
Preadmission screen  

## 2011-08-14 ENCOUNTER — Ambulatory Visit (HOSPITAL_COMMUNITY): Payer: Medicaid Other

## 2011-08-14 NOTE — Progress Notes (Signed)
NST 07/15/11 Reactive and Reassuring

## 2011-08-16 ENCOUNTER — Other Ambulatory Visit: Payer: Medicaid Other

## 2011-08-19 ENCOUNTER — Ambulatory Visit (INDEPENDENT_AMBULATORY_CARE_PROVIDER_SITE_OTHER): Payer: Medicaid Other | Admitting: Obstetrics and Gynecology

## 2011-08-19 ENCOUNTER — Ambulatory Visit (HOSPITAL_COMMUNITY)
Admission: RE | Admit: 2011-08-19 | Discharge: 2011-08-19 | Disposition: A | Discharge status: Home / Self Care | Payer: Medicaid Other | Source: Ambulatory Visit | Attending: Family Medicine | Admitting: Family Medicine

## 2011-08-19 VITALS — BP 105/77 | HR 77 | Temp 96.9°F | Wt 192.3 lb

## 2011-08-19 DIAGNOSIS — O0990 Supervision of high risk pregnancy, unspecified, unspecified trimester: Secondary | ICD-10-CM

## 2011-08-19 DIAGNOSIS — O24919 Unspecified diabetes mellitus in pregnancy, unspecified trimester: Secondary | ICD-10-CM

## 2011-08-19 DIAGNOSIS — O3660X0 Maternal care for excessive fetal growth, unspecified trimester, not applicable or unspecified: Secondary | ICD-10-CM

## 2011-08-19 DIAGNOSIS — Z9889 Other specified postprocedural states: Secondary | ICD-10-CM

## 2011-08-19 DIAGNOSIS — Z98891 History of uterine scar from previous surgery: Secondary | ICD-10-CM

## 2011-08-19 DIAGNOSIS — O99810 Abnormal glucose complicating pregnancy: Secondary | ICD-10-CM | POA: Insufficient documentation

## 2011-08-19 LAB — POCT URINALYSIS DIP (DEVICE)
Glucose, UA: NEGATIVE mg/dL
Nitrite: NEGATIVE
Urobilinogen, UA: 2 mg/dL — ABNORMAL HIGH (ref 0.0–1.0)

## 2011-08-19 NOTE — Progress Notes (Signed)
Edema trace on feet. Pain on lower back on/off. C/o of headaches. Pressure in pelvic area.

## 2011-08-19 NOTE — Progress Notes (Signed)
US growth done today 

## 2011-08-19 NOTE — Progress Notes (Signed)
IOL scheduled 08/24/11 at 730 am.

## 2011-08-19 NOTE — Progress Notes (Signed)
Patient doing well. Ultrasound findings reviewed with patient. Patient understands but is not convinced that this baby is larger. Concerns explained including risk of shoulder dystocia, labor protraction and/or need for emergency c-section explained. Patient verbalized understanding and desires TOLAC. Will schedule IOL on 2/16. Category I tracing reviewed.

## 2011-08-21 ENCOUNTER — Encounter (HOSPITAL_COMMUNITY): Payer: Self-pay | Admitting: Anesthesiology

## 2011-08-21 ENCOUNTER — Inpatient Hospital Stay (HOSPITAL_COMMUNITY)
Admission: AD | Admit: 2011-08-21 | Discharge: 2011-08-24 | DRG: 765 | Disposition: A | Discharge status: Home / Self Care | Payer: Medicaid Other | Source: Ambulatory Visit | Attending: Family Medicine | Admitting: Family Medicine

## 2011-08-21 ENCOUNTER — Encounter (HOSPITAL_COMMUNITY): Payer: Self-pay | Admitting: *Deleted

## 2011-08-21 ENCOUNTER — Other Ambulatory Visit: Payer: Self-pay | Admitting: Family Medicine

## 2011-08-21 ENCOUNTER — Inpatient Hospital Stay (HOSPITAL_COMMUNITY): Payer: Medicaid Other | Admitting: Anesthesiology

## 2011-08-21 ENCOUNTER — Encounter (HOSPITAL_COMMUNITY)
Admission: AD | Disposition: A | Discharge status: Home / Self Care | Payer: Self-pay | Source: Ambulatory Visit | Attending: Family Medicine

## 2011-08-21 DIAGNOSIS — O34219 Maternal care for unspecified type scar from previous cesarean delivery: Principal | ICD-10-CM | POA: Diagnosis present

## 2011-08-21 DIAGNOSIS — IMO0001 Reserved for inherently not codable concepts without codable children: Secondary | ICD-10-CM

## 2011-08-21 DIAGNOSIS — Z862 Personal history of diseases of the blood and blood-forming organs and certain disorders involving the immune mechanism: Secondary | ICD-10-CM

## 2011-08-21 DIAGNOSIS — O3660X0 Maternal care for excessive fetal growth, unspecified trimester, not applicable or unspecified: Secondary | ICD-10-CM | POA: Diagnosis present

## 2011-08-21 DIAGNOSIS — Z98891 History of uterine scar from previous surgery: Secondary | ICD-10-CM

## 2011-08-21 DIAGNOSIS — O9903 Anemia complicating the puerperium: Secondary | ICD-10-CM

## 2011-08-21 DIAGNOSIS — D649 Anemia, unspecified: Secondary | ICD-10-CM

## 2011-08-21 DIAGNOSIS — O24919 Unspecified diabetes mellitus in pregnancy, unspecified trimester: Secondary | ICD-10-CM

## 2011-08-21 DIAGNOSIS — B1910 Unspecified viral hepatitis B without hepatic coma: Secondary | ICD-10-CM

## 2011-08-21 DIAGNOSIS — O99814 Abnormal glucose complicating childbirth: Secondary | ICD-10-CM | POA: Diagnosis present

## 2011-08-21 LAB — COMPREHENSIVE METABOLIC PANEL
Albumin: 2.3 g/dL — ABNORMAL LOW (ref 3.5–5.2)
BUN: 8 mg/dL (ref 6–23)
Creatinine, Ser: 0.69 mg/dL (ref 0.50–1.10)
Potassium: 4.1 mEq/L (ref 3.5–5.1)
Total Protein: 6.7 g/dL (ref 6.0–8.3)

## 2011-08-21 LAB — GLUCOSE, CAPILLARY
Glucose-Capillary: 96 mg/dL (ref 70–99)
Glucose-Capillary: 92 mg/dL (ref 70–99)
Glucose-Capillary: 132 mg/dL — ABNORMAL HIGH (ref 70–99)

## 2011-08-21 LAB — FIBRINOGEN: Fibrinogen: 454 mg/dL (ref 204–475)

## 2011-08-21 LAB — CBC
Hemoglobin: 10.5 g/dL — ABNORMAL LOW (ref 12.0–15.0)
MCHC: 31.3 g/dL (ref 30.0–36.0)
RDW: 18.5 % — ABNORMAL HIGH (ref 11.5–15.5)
WBC: 5 10*3/uL (ref 4.0–10.5)

## 2011-08-21 LAB — RPR: RPR Ser Ql: NONREACTIVE

## 2011-08-21 LAB — APTT: aPTT: 29 seconds (ref 24–37)

## 2011-08-21 SURGERY — Surgical Case
Anesthesia: Spinal | Site: Abdomen | Wound class: Clean Contaminated

## 2011-08-21 MED ORDER — SCOPOLAMINE 1 MG/3DAYS TD PT72
1.0000 | MEDICATED_PATCH | Freq: Once | TRANSDERMAL | Status: AC
  Administered 2011-08-21: 1.5 mg via TRANSDERMAL

## 2011-08-21 MED ORDER — OXYTOCIN 20 UNITS IN LACTATED RINGERS INFUSION - SIMPLE
INTRAVENOUS | Status: AC
  Administered 2011-08-21: 125 mL/h via INTRAVENOUS
  Filled 2011-08-21: qty 1000

## 2011-08-21 MED ORDER — LANOLIN HYDROUS EX OINT: 1.0000 "application " | TOPICAL_OINTMENT | CUTANEOUS | Status: DC | PRN

## 2011-08-21 MED ORDER — SIMETHICONE 80 MG PO CHEW
80.0000 mg | CHEWABLE_TABLET | ORAL | Status: DC | PRN
  Administered 2011-08-22: 80 mg via ORAL

## 2011-08-21 MED ORDER — NALBUPHINE HCL 10 MG/ML IJ SOLN
5.0000 mg | INTRAMUSCULAR | Status: DC | PRN
  Filled 2011-08-21: qty 1

## 2011-08-21 MED ORDER — ACETAMINOPHEN 325 MG PO TABS: 650.0000 mg | ORAL_TABLET | ORAL | Status: DC | PRN

## 2011-08-21 MED ORDER — ONDANSETRON HCL 4 MG/2ML IJ SOLN: 4.0000 mg | Freq: Three times a day (TID) | INTRAMUSCULAR | Status: DC | PRN

## 2011-08-21 MED ORDER — MORPHINE SULFATE 0.5 MG/ML IJ SOLN
INTRAMUSCULAR | Status: AC
  Filled 2011-08-21: qty 10

## 2011-08-21 MED ORDER — OXYTOCIN 20 UNITS IN LACTATED RINGERS INFUSION - SIMPLE
INTRAVENOUS | Status: DC | PRN
  Administered 2011-08-21: 20 [IU] via INTRAVENOUS

## 2011-08-21 MED ORDER — ONDANSETRON HCL 4 MG/2ML IJ SOLN: 4.0000 mg | INTRAMUSCULAR | Status: DC | PRN

## 2011-08-21 MED ORDER — BUPIVACAINE HCL (PF) 0.5 % IJ SOLN
INTRAMUSCULAR | Status: AC
  Filled 2011-08-21: qty 30

## 2011-08-21 MED ORDER — LACTATED RINGERS IV SOLN
INTRAVENOUS | Status: DC | PRN
  Administered 2011-08-21: 07:00:00 via INTRAVENOUS

## 2011-08-21 MED ORDER — FENTANYL CITRATE 0.05 MG/ML IJ SOLN
INTRAMUSCULAR | Status: AC
  Filled 2011-08-21: qty 2

## 2011-08-21 MED ORDER — NALBUPHINE HCL 10 MG/ML IJ SOLN
5.0000 mg | INTRAMUSCULAR | Status: DC | PRN
Start: 2011-08-21 — End: 2011-08-24
  Filled 2011-08-21: qty 1

## 2011-08-21 MED ORDER — PRENATAL MULTIVITAMIN CH
1.0000 | ORAL_TABLET | Freq: Every day | ORAL | Status: DC
  Administered 2011-08-22 – 2011-08-24 (×2): 1 via ORAL
  Filled 2011-08-21 (×2): qty 1

## 2011-08-21 MED ORDER — DIPHENHYDRAMINE HCL 25 MG PO CAPS: 25.0000 mg | ORAL_CAPSULE | ORAL | Status: DC | PRN

## 2011-08-21 MED ORDER — IBUPROFEN 600 MG PO TABS
600.0000 mg | ORAL_TABLET | Freq: Four times a day (QID) | ORAL | Status: DC
  Administered 2011-08-22 (×2): 600 mg via ORAL
  Filled 2011-08-21 (×2): qty 1

## 2011-08-21 MED ORDER — FENTANYL CITRATE 0.05 MG/ML IJ SOLN
INTRAMUSCULAR | Status: AC
  Administered 2011-08-21: 50 ug via INTRAVENOUS
  Filled 2011-08-21: qty 2

## 2011-08-21 MED ORDER — DIPHENHYDRAMINE HCL 50 MG/ML IJ SOLN: 25.0000 mg | INTRAMUSCULAR | Status: DC | PRN

## 2011-08-21 MED ORDER — EPHEDRINE SULFATE 50 MG/ML IJ SOLN
INTRAMUSCULAR | Status: DC | PRN
  Administered 2011-08-21 (×5): 10 mg via INTRAVENOUS

## 2011-08-21 MED ORDER — MEASLES, MUMPS & RUBELLA VAC ~~LOC~~ INJ
0.5000 mL | INJECTION | Freq: Once | SUBCUTANEOUS | Status: DC
  Filled 2011-08-21: qty 0.5

## 2011-08-21 MED ORDER — MORPHINE SULFATE (PF) 0.5 MG/ML IJ SOLN
INTRAMUSCULAR | Status: DC | PRN
  Administered 2011-08-21: .1 mg via INTRATHECAL

## 2011-08-21 MED ORDER — GENTAMICIN SULFATE 40 MG/ML IJ SOLN
Freq: Once | INTRAVENOUS | Status: AC
  Administered 2011-08-21: 07:00:00 via INTRAVENOUS
  Filled 2011-08-21: qty 2.5

## 2011-08-21 MED ORDER — BUPIVACAINE HCL (PF) 0.25 % IJ SOLN
INTRAMUSCULAR | Status: AC
  Filled 2011-08-21: qty 30

## 2011-08-21 MED ORDER — HYDROMORPHONE HCL PF 1 MG/ML IJ SOLN
INTRAMUSCULAR | Status: AC
  Administered 2011-08-21: 0.5 mg via INTRAVENOUS
  Filled 2011-08-21: qty 1

## 2011-08-21 MED ORDER — SCOPOLAMINE 1 MG/3DAYS TD PT72
MEDICATED_PATCH | TRANSDERMAL | Status: AC
  Filled 2011-08-21: qty 1

## 2011-08-21 MED ORDER — DIBUCAINE 1 % RE OINT: 1.0000 "application " | TOPICAL_OINTMENT | RECTAL | Status: DC | PRN

## 2011-08-21 MED ORDER — HYDROMORPHONE HCL PF 1 MG/ML IJ SOLN
0.5000 mg | INTRAMUSCULAR | Status: DC | PRN
  Administered 2011-08-21 (×2): 0.5 mg via INTRAVENOUS

## 2011-08-21 MED ORDER — IBUPROFEN 600 MG PO TABS: 600.0000 mg | ORAL_TABLET | Freq: Four times a day (QID) | ORAL | Status: DC | PRN

## 2011-08-21 MED ORDER — FLEET ENEMA 7-19 GM/118ML RE ENEM: 1.0000 | ENEMA | RECTAL | Status: DC | PRN

## 2011-08-21 MED ORDER — OXYTOCIN 20 UNITS IN LACTATED RINGERS INFUSION - SIMPLE: 125.0000 mL/h | Freq: Once | INTRAVENOUS | Status: DC

## 2011-08-21 MED ORDER — ONDANSETRON HCL 4 MG/2ML IJ SOLN
INTRAMUSCULAR | Status: DC | PRN
  Administered 2011-08-21: 4 mg via INTRAVENOUS

## 2011-08-21 MED ORDER — DIPHENHYDRAMINE HCL 50 MG/ML IJ SOLN: 12.5000 mg | INTRAMUSCULAR | Status: DC | PRN

## 2011-08-21 MED ORDER — OXYTOCIN 20 UNITS IN LACTATED RINGERS INFUSION - SIMPLE
125.0000 mL/h | INTRAVENOUS | Status: AC
  Administered 2011-08-21: 125 mL/h via INTRAVENOUS

## 2011-08-21 MED ORDER — BUPIVACAINE IN DEXTROSE 0.75-8.25 % IT SOLN
INTRATHECAL | Status: DC | PRN
  Administered 2011-08-21: 11.75 mg via INTRATHECAL

## 2011-08-21 MED ORDER — LIDOCAINE HCL (PF) 1 % IJ SOLN
30.0000 mL | INTRAMUSCULAR | Status: DC | PRN
  Filled 2011-08-21: qty 30

## 2011-08-21 MED ORDER — ONDANSETRON HCL 4 MG/2ML IJ SOLN: 4.0000 mg | Freq: Four times a day (QID) | INTRAMUSCULAR | Status: DC | PRN

## 2011-08-21 MED ORDER — LACTATED RINGERS IV SOLN: INTRAVENOUS | Status: DC

## 2011-08-21 MED ORDER — TETANUS-DIPHTH-ACELL PERTUSSIS 5-2.5-18.5 LF-MCG/0.5 IM SUSP
0.5000 mL | Freq: Once | INTRAMUSCULAR | Status: AC
  Administered 2011-08-22: 0.5 mL via INTRAMUSCULAR
  Filled 2011-08-21: qty 0.5

## 2011-08-21 MED ORDER — DEXTROSE IN LACTATED RINGERS 5 % IV SOLN
INTRAVENOUS | Status: DC
  Administered 2011-08-21: 15:00:00 via INTRAVENOUS

## 2011-08-21 MED ORDER — SIMETHICONE 80 MG PO CHEW
80.0000 mg | CHEWABLE_TABLET | Freq: Three times a day (TID) | ORAL | Status: DC
  Administered 2011-08-21 – 2011-08-24 (×9): 80 mg via ORAL

## 2011-08-21 MED ORDER — CITRIC ACID-SODIUM CITRATE 334-500 MG/5ML PO SOLN
30.0000 mL | ORAL | Status: DC | PRN
  Administered 2011-08-21: 30 mL via ORAL
  Filled 2011-08-21: qty 15

## 2011-08-21 MED ORDER — SODIUM CHLORIDE 0.9 % IJ SOLN
3.0000 mL | INTRAMUSCULAR | Status: DC | PRN
Start: 2011-08-21 — End: 2011-08-24

## 2011-08-21 MED ORDER — METOCLOPRAMIDE HCL 5 MG/ML IJ SOLN: 10.0000 mg | Freq: Three times a day (TID) | INTRAMUSCULAR | Status: DC | PRN

## 2011-08-21 MED ORDER — WITCH HAZEL-GLYCERIN EX PADS: 1.0000 "application " | MEDICATED_PAD | CUTANEOUS | Status: DC | PRN

## 2011-08-21 MED ORDER — ONDANSETRON HCL 4 MG/2ML IJ SOLN
INTRAMUSCULAR | Status: AC
  Filled 2011-08-21: qty 2

## 2011-08-21 MED ORDER — KETOROLAC TROMETHAMINE 30 MG/ML IJ SOLN
30.0000 mg | Freq: Four times a day (QID) | INTRAMUSCULAR | Status: DC | PRN
  Administered 2011-08-21: 30 mg via INTRAVENOUS
  Filled 2011-08-21: qty 1

## 2011-08-21 MED ORDER — NALOXONE HCL 0.4 MG/ML IJ SOLN: 0.4000 mg | INTRAMUSCULAR | Status: DC | PRN

## 2011-08-21 MED ORDER — FENTANYL CITRATE 0.05 MG/ML IJ SOLN
25.0000 ug | INTRAMUSCULAR | Status: DC | PRN
  Administered 2011-08-21 (×3): 50 ug via INTRAVENOUS

## 2011-08-21 MED ORDER — EPHEDRINE 5 MG/ML INJ
INTRAVENOUS | Status: AC
  Filled 2011-08-21: qty 10

## 2011-08-21 MED ORDER — OXYCODONE-ACETAMINOPHEN 5-325 MG PO TABS: 1.0000 | ORAL_TABLET | ORAL | Status: DC | PRN

## 2011-08-21 MED ORDER — SODIUM CHLORIDE 0.9 % IV SOLN
1.0000 ug/kg/h | INTRAVENOUS | Status: DC | PRN
  Filled 2011-08-21: qty 2.5

## 2011-08-21 MED ORDER — ZOLPIDEM TARTRATE 5 MG PO TABS: 5.0000 mg | ORAL_TABLET | Freq: Every evening | ORAL | Status: DC | PRN

## 2011-08-21 MED ORDER — LACTATED RINGERS IV SOLN
500.0000 mL | INTRAVENOUS | Status: DC | PRN
  Administered 2011-08-21: 500 mL via INTRAVENOUS

## 2011-08-21 MED ORDER — CLINDAMYCIN PHOSPHATE 900 MG/50ML IV SOLN: 900.0000 mg | Freq: Once | INTRAVENOUS | Status: DC

## 2011-08-21 MED ORDER — MEPERIDINE HCL 25 MG/ML IJ SOLN: 6.2500 mg | INTRAMUSCULAR | Status: DC | PRN

## 2011-08-21 MED ORDER — SENNOSIDES-DOCUSATE SODIUM 8.6-50 MG PO TABS
2.0000 | ORAL_TABLET | Freq: Every day | ORAL | Status: DC
  Administered 2011-08-21 – 2011-08-23 (×3): 2 via ORAL

## 2011-08-21 MED ORDER — FENTANYL CITRATE 0.05 MG/ML IJ SOLN
INTRAMUSCULAR | Status: DC | PRN
  Administered 2011-08-21: 15 ug via INTRATHECAL

## 2011-08-21 MED ORDER — BUPIVACAINE HCL (PF) 0.25 % IJ SOLN
INTRAMUSCULAR | Status: DC | PRN
  Administered 2011-08-21: 30 mL

## 2011-08-21 MED ORDER — MENTHOL 3 MG MT LOZG: 1.0000 | LOZENGE | OROMUCOSAL | Status: DC | PRN

## 2011-08-21 MED ORDER — NALBUPHINE SYRINGE 5 MG/0.5 ML
5.0000 mg | INJECTION | INTRAMUSCULAR | Status: DC | PRN
  Administered 2011-08-21: 5 mg via INTRAVENOUS
  Filled 2011-08-21: qty 0.5

## 2011-08-21 MED ORDER — OXYCODONE-ACETAMINOPHEN 5-325 MG PO TABS
1.0000 | ORAL_TABLET | ORAL | Status: DC | PRN
  Administered 2011-08-22: 1 via ORAL
  Administered 2011-08-22: 2 via ORAL
  Administered 2011-08-22: 1 via ORAL
  Administered 2011-08-22 – 2011-08-23 (×4): 2 via ORAL
  Administered 2011-08-23 – 2011-08-24 (×2): 1 via ORAL
  Filled 2011-08-21: qty 1
  Filled 2011-08-21: qty 2
  Filled 2011-08-21: qty 1
  Filled 2011-08-21 (×3): qty 2
  Filled 2011-08-21: qty 1
  Filled 2011-08-21: qty 2
  Filled 2011-08-21: qty 1

## 2011-08-21 MED ORDER — GENTAMICIN IN SALINE 1-0.9 MG/ML-% IV SOLN: 100.0000 mg | Freq: Once | INTRAVENOUS | Status: DC

## 2011-08-21 MED ORDER — DIPHENHYDRAMINE HCL 25 MG PO CAPS: 25.0000 mg | ORAL_CAPSULE | Freq: Four times a day (QID) | ORAL | Status: DC | PRN

## 2011-08-21 MED ORDER — ONDANSETRON HCL 4 MG PO TABS: 4.0000 mg | ORAL_TABLET | ORAL | Status: DC | PRN

## 2011-08-21 MED ORDER — OXYTOCIN BOLUS FROM INFUSION
500.0000 mL | Freq: Once | INTRAVENOUS | Status: DC
  Filled 2011-08-21: qty 500
  Filled 2011-08-21: qty 1000

## 2011-08-21 SURGICAL SUPPLY — 38 items
APL SKNCLS STERI-STRIP NONHPOA (GAUZE/BANDAGES/DRESSINGS) ×1
BARRIER ADHS 3X4 INTERCEED (GAUZE/BANDAGES/DRESSINGS) ×1 IMPLANT
BENZOIN TINCTURE PRP APPL 2/3 (GAUZE/BANDAGES/DRESSINGS) ×1 IMPLANT
BRR ADH 4X3 ABS CNTRL BYND (GAUZE/BANDAGES/DRESSINGS) ×1
CHLORAPREP W/TINT 26ML (MISCELLANEOUS) ×2 IMPLANT
CLOTH BEACON ORANGE TIMEOUT ST (SAFETY) ×2 IMPLANT
DRESSING TELFA 8X3 (GAUZE/BANDAGES/DRESSINGS) ×3 IMPLANT
DRSG PAD ABDOMINAL 8X10 ST (GAUZE/BANDAGES/DRESSINGS) ×1 IMPLANT
ELECT REM PT RETURN 9FT ADLT (ELECTROSURGICAL) ×2
ELECTRODE REM PT RTRN 9FT ADLT (ELECTROSURGICAL) ×1 IMPLANT
EXTRACTOR VACUUM M CUP 4 TUBE (SUCTIONS) IMPLANT
GAUZE SPONGE 4X4 12PLY STRL LF (GAUZE/BANDAGES/DRESSINGS) ×4 IMPLANT
GLOVE BIOGEL PI IND STRL 7.0 (GLOVE) ×1 IMPLANT
GLOVE BIOGEL PI INDICATOR 7.0 (GLOVE) ×1
GLOVE ECLIPSE 7.0 STRL STRAW (GLOVE) ×4 IMPLANT
GOWN PREVENTION PLUS LG XLONG (DISPOSABLE) ×4 IMPLANT
GOWN PREVENTION PLUS XLARGE (GOWN DISPOSABLE) ×2 IMPLANT
KIT ABG SYR 3ML LUER SLIP (SYRINGE) IMPLANT
NDL HYPO 25X5/8 SAFETYGLIDE (NEEDLE) IMPLANT
NEEDLE HYPO 22GX1.5 SAFETY (NEEDLE) ×2 IMPLANT
NEEDLE HYPO 25X5/8 SAFETYGLIDE (NEEDLE) IMPLANT
NS IRRIG 1000ML POUR BTL (IV SOLUTION) ×2 IMPLANT
PACK C SECTION WH (CUSTOM PROCEDURE TRAY) ×2 IMPLANT
PAD ABD 7.5X8 STRL (GAUZE/BANDAGES/DRESSINGS) IMPLANT
RTRCTR C-SECT PINK 25CM LRG (MISCELLANEOUS) IMPLANT
SLEEVE SCD COMPRESS KNEE MED (MISCELLANEOUS) IMPLANT
SPONGE GAUZE 4X4 12PLY (GAUZE/BANDAGES/DRESSINGS) ×1 IMPLANT
STAPLER VISISTAT 35W (STAPLE) IMPLANT
STRIP CLOSURE SKIN 1/2X4 (GAUZE/BANDAGES/DRESSINGS) ×1 IMPLANT
SUT VIC AB 0 CTX 36 (SUTURE) ×12
SUT VIC AB 0 CTX36XBRD ANBCTRL (SUTURE) ×3 IMPLANT
SUT VIC AB 4-0 KS 27 (SUTURE) ×1 IMPLANT
SUT VICRYL RAPIDE 4/0 PS 2 (SUTURE) ×1 IMPLANT
SYR 30ML LL (SYRINGE) ×2 IMPLANT
TAPE CLOTH SURG 4X10 WHT LF (GAUZE/BANDAGES/DRESSINGS) ×1 IMPLANT
TOWEL OR 17X24 6PK STRL BLUE (TOWEL DISPOSABLE) ×4 IMPLANT
TRAY FOLEY CATH 14FR (SET/KITS/TRAYS/PACK) ×2 IMPLANT
WATER STERILE IRR 1000ML POUR (IV SOLUTION) ×2 IMPLANT

## 2011-08-21 NOTE — Anesthesia Procedure Notes (Signed)
Spinal  Patient location during procedure: OR Start time: 08/21/2011 7:21 AM Staffing Performed by: anesthesiologist  Preanesthetic Checklist Completed: patient identified, site marked, surgical consent, pre-op evaluation, timeout performed, IV checked, risks and benefits discussed and monitors and equipment checked Spinal Block Patient position: sitting Prep: site prepped and draped and DuraPrep Patient monitoring: heart rate, cardiac monitor, continuous pulse ox and blood pressure Approach: midline Location: L3-4 Injection technique: single-shot Needle Needle type: Pencan  Needle gauge: 24 G Needle length: 9 cm Assessment Sensory level: T4 Additional Notes Clear free flow CSF on first attempt.  Patient tolerated procedure well.  Jasmine December, MD

## 2011-08-21 NOTE — Interval H&P Note (Signed)
History and Physical Interval Note:  08/21/2011 6:55 AM  Brenda Cannon  has presented today for surgery, with the diagnosis of term labor with previous C-section.  She had previous C-Section and originally wanted a TOLAC.  Has now decided for C-section.  She has low plts at 93, with normal coags. The various methods of treatment have been discussed with the patient and family. After consideration of risks, benefits and other options for treatment, the patient has consented to  Procedure(s) (LRB): CESAREAN SECTION (N/A) as a surgical intervention .  The patients' history has been reviewed, patient examined, no change in status, stable for surgery.  I have reviewed the patients' chart and labs.  Questions were answered to the patient's satisfaction.     Kileigh Ortmann S

## 2011-08-21 NOTE — Progress Notes (Signed)
08/21/11 1610  Provider Notification  Provider Name/Title Dorathy Kinsman, CNM  Method of Notification Phone  Notification Reason Fetal heart rate change;Vaginal exam  provider notified about difficulty tracing FHR. Will come and assess, possible AROM and IFSE

## 2011-08-21 NOTE — Progress Notes (Signed)
Pt to room 166 via stretcher with CNM Ivonne Andrew at bedside.

## 2011-08-21 NOTE — H&P (Signed)
Chart reviewed and agree with management and plan.  

## 2011-08-21 NOTE — Progress Notes (Signed)
Birthing suites charge RN notified of VE 8/100/bulging bag.  Pt may go to room 166.

## 2011-08-21 NOTE — H&P (Signed)
Brenda Cannon is a 31 y.o. year old G26P2022 female at [redacted]w[redacted]d weeks gestation who presents to [facility] reporting Labor. She received care at the [facility] for GDM initially on Glyburide, then insulin. The pt was inconsistent with blood sugar testing. ~1/3 of CBGs were out of range, but control improved toward the end of the pregnancy.    Maternal Medical History:  Reason for admission: Reason for admission: contractions.  Reason for Admission:   nauseaContractions: Onset was 6-12 hours ago.   Frequency: regular.   Perceived severity is strong.    Fetal activity: Perceived fetal activity is normal.   Last perceived fetal movement was within the past hour.    Prenatal Complications - Diabetes: gestational. Diabetes is managed by insulin injections.      OB History    Grav Para Term Preterm Abortions TAB SAB Ect Mult Living   5 2 2  0 2  1 0 0 2     Past Medical History  Diagnosis Date  . Gestational diabetes     GDM with last pregnancy (2008)  . History of hepatitis B 2007  . History of malaria 2003  . Difficulty Reading   . History of gestational diabetes   . Obese   . History of thrombocytopenia   . Abnormal Pap smear     ascus with High Risk for HPV  Suspected macrosomia: [place] 08/19/11 EFW 9-11, 4400 gm, >90% Fetal ventricular hypertrophy  Past Surgical History  Procedure Date  . Cesarean section 2008    last pregnacy   Family History: family history includes Hypertension in her mother. Social History:  reports that she has never smoked. She does not have any smokeless tobacco history on file. She reports that she does not drink alcohol or use illicit drugs.  Review of Systems  Constitutional: Negative for fever and chills.  Eyes: Negative for blurred vision.  Gastrointestinal: Negative for nausea and vomiting.  Neurological: Negative for headaches.    Dilation: 8 Effacement (%): 100 Station: -1 Exam by:: V. Jennene Downie, cnm Blood pressure 125/76, pulse 72, temperature 97.7 F  (36.5 C), temperature source Oral, resp. rate 20, height 5\' 1"  (1.549 m), last menstrual period 11/24/2010. Maternal Exam:  Uterine Assessment: Contraction strength is firm.  Contraction frequency is regular.   Abdomen: Surgical scars: low transverse.   Fundal height is S>D.   Fetal presentation: vertex  Introitus: Normal vulva. Pelvis: adequate for delivery.   Cervix: Cervix evaluated by digital exam.     Fetal Exam Fetal Monitor Review: Mode: ultrasound.   Baseline rate: 120.  Variability: moderate (6-25 bpm).   Pattern: accelerations present and no decelerations.    Fetal State Assessment: Category I - tracings are normal.     Physical Exam  Nursing note and vitals reviewed. Constitutional: She is oriented to person, place, and time. She appears well-developed and well-nourished. She appears distressed.  HENT:  Head: Normocephalic.  Eyes: No scleral icterus.  Cardiovascular: Normal rate.   Respiratory: Effort normal.  GI: Soft. There is no tenderness.  Genitourinary: There is bleeding (scant bloody show) around the vagina.  Neurological: She is alert and oriented to person, place, and time.  Skin: Skin is warm and dry.  Psychiatric: She has a normal mood and affect.    Prenatal labs: ABO, Rh: --/--/O POS (06/30 2150) Antibody: Negative (08/22 0000) Rubella: Immune (08/22 0000) RPR: Nonreactive, Nonreactive (08/22 0000)  HBsAg: Positive (08/22 0000)  HIV: Non-reactive, Non-reactive, Non-reactive (08/22 0000)  GBS: Negative (01/28 0000)  Failed 3 hour GTT  CBG 96  Assessment: 1. Labor: transition 2. Fetal Wellbeing: Category I  3. Pain Control: none 4. GBS: neg 5. 38.4 week IUP 6. GDM-A2 7. Hx C/S and SVD. Desire TOLAC. Extensive counseling about increased risk of shoulder dystocia among diabetic mothers w/ macrosomic babies during multiple prenatal visits. Pt declines RLTCS. 8. Fetal ventricular hypertrophy  Plan:  1. Admit to BS 2. Routine L&D  orders 3. Analgesia/anesthesia PRN  4. CBG. Will start glucostabilizer if not delivered in 2 hours    [place], Koleen Nimrod 08/21/2011, 4:31 AM

## 2011-08-21 NOTE — Progress Notes (Signed)
Brenda Cannon is a 31 y.o. [id number] at [redacted]w[redacted]d.  Subjective: Strong urge to push  Objective: BP 125/76  Pulse 72  Temp(Src) 97.7 F (36.5 C) (Oral)  Resp 20  Ht 5\' 1"  (1.549 m)  Wt 87.091 kg (192 lb)  BMI 36.28 kg/m2  LMP 11/24/2010      FHT:  FHR: 120 bpm, variability: moderate,  accelerations:  Present,  decelerations:  Absent UC:   regular, every 2-3 minutes SVE:   Dilation: 10 Effacement (%): 100 Station: 0 Exam by:: Crimson Beer AROM moderate amount of light MSF  Labs: Lab Results  Component Value Date   WBC 5.0 08/21/2011   HGB 10.5* 08/21/2011   HCT 33.5* 08/21/2011   MCV 80.7 08/21/2011   PLT 93* 08/21/2011   CBG 92 Assessment / Plan: Spontaneous labor, progressing normally, will start actively pushing.   Labor: Progressing normally Preeclampsia:  NA Fetal Wellbeing:  Category I Pain Control:  Labor support without medications I/D:  n/a Anticipated MOD:  NSVD  Brenda Cannon 08/21/2011, 6:23 AM

## 2011-08-21 NOTE — Progress Notes (Signed)
Patient continuing to complain of pain of 5 out of 10.  Called Dr. Arby Barrette to request PCA due to platelets of 93.  Dr.  Arby Barrette states that he is okay with pt receiving toradol for pain.  Called Dr. Adrian Blackwater to request toradol dosing and to check if patient can have ibuprofen after toradol dosing.  Dr. Adrian Blackwater states that patient can have toradol for pain prn as per post-op c/s orders.  Will continue to monitor.  Earl Gala, Linda Hedges [place]

## 2011-08-21 NOTE — Progress Notes (Signed)
V.Smith,CNM in and verified vertex presentation by [place]

## 2011-08-21 NOTE — Anesthesia Postprocedure Evaluation (Signed)
Anesthesia Post Note  Patient: Brenda Cannon  Procedure(s) Performed: Procedure(s) (LRB): CESAREAN SECTION (N/A)  Anesthesia type: Spinal  Patient location: PACU  Post pain: Pain level controlled  Post assessment: Post-op Vital signs reviewed  Last Vitals:  Filed Vitals:   08/21/11 0708  BP:   Pulse:   Temp:   Resp: 18    Post vital signs: Reviewed  Level of consciousness: awake  Complications: No apparent anesthesia complications

## 2011-08-21 NOTE — Progress Notes (Signed)
Pt G5 P2 at 38.4wks having contractions.

## 2011-08-21 NOTE — Op Note (Signed)
Procedure: Primary low transverse cesarean section Surgeon: Tinnie Gens, M.D. Asst.: None Findings: Viable female infant, Apgars 9,9, weight 8 lb 6 oz, vertex presentation, LOA position Estimated blood loss: 1000 cc Complications: None known Specimens: Placenta to labor and delivery Reason for procedure: Briefly, the patient is a 34 y.A.[id number] at [redacted]w[redacted]d with prev. C-section who desired TOLAC.  Presented in labor and then elected for RC-S. Procedure: Patient is a to the OR where spinal analgesia was administered. She was then placed in a supine position with left lateral tilt. She received [place] and SCDs were in place. A timeout was performed. She was prepped and draped in the usual sterile fashion. A Foley catheter was placed in the bladder. A knife was then used to make a Pfannenstiel incision. This incision was carried out to underlying fascia which was divided in the midline with the knife. The incision was extended laterally, bluntly.  Kocher clamps elevated the fascia off the rectus and peritoneum and omentum were adherent here.  The omentum was clamped and divided.The rectus was divided in the midline.  The peritoneal cavity was entered sharply.  Adhesions of omentum and peritoneum were noted to the uterus and were taken down with the electrocautery.  There was bleeding noted where adhesions were taken down on the pt's right side.   Alexis retractor was placed inside the incision.  A knife was used to make a low transverse incision on the uterus. This incision was carried down to the amniotic cavity was entered. Fetus was in LOA position and was brought up out of the incision without difficulty. Cord was clamped x 2 and cut. Infant taken to waiting pediatrician.  Cord blood was obtained. Placenta was delivered from the uterus.  Uterus was cleaned with dry lap pads. Uterine incision closed with 0 Vicryl suture in a locked running fashion. A figure of eight was needed at the left angle and on  the right where the adhesions were which extended out into the broad ligament. Hemostasis was obtained. Intercede was placed over the incision.  Alexis retractor was removed from the abdomen. Peritoneal closure was done with 0 Vicryl suture.  Fascia is closed with 0 Vicryl suture in a running fashion. Subcutaneous tissue infused with 30cc 0.25% Marcaine.  Subcutaneous closure was performed with 0 plain suture.  Skin closed using 4-0 Vicryl on a Keith needle.  Steri-strips and Benzoin applied.  All instrument, needle and lap counts were correct x 2.  Patient was awake and taken to PACU stable.  Infant to Newborn Nursery, stable.

## 2011-08-21 NOTE — Progress Notes (Signed)
UR chart review completed.  

## 2011-08-21 NOTE — Transfer of Care (Signed)
Immediate Anesthesia Transfer of Care Note  Patient: Brenda Cannon  Procedure(s) Performed: Procedure(s) (LRB): CESAREAN SECTION (N/A)  Patient Location: PACU  Anesthesia Type: Regional  Level of Consciousness: awake, alert  and oriented  Airway & Oxygen Therapy: Patient Spontanous Breathing  Post-op Assessment: Report given to PACU RN and Post -op Vital signs reviewed and stable  Post vital signs: Reviewed and stable  Complications: No apparent anesthesia complications

## 2011-08-21 NOTE — Progress Notes (Signed)
08/21/11 0605  Epidural  Epidural   ANMD notified  Dr. Rodman Pickle wants PT/PTT before epidural due to low platelet

## 2011-08-21 NOTE — Anesthesia Preprocedure Evaluation (Addendum)
Anesthesia Evaluation  Patient identified by MRN, date of birth, ID band Patient awake    Reviewed: Allergy & Precautions, H&P , NPO status , Patient's Chart, lab work & pertinent test results, reviewed documented beta blocker date and time   History of Anesthesia Complications Negative for: history of anesthetic complications  Airway Mallampati: III TM Distance: >3 FB Neck ROM: full    Dental  (+) Teeth Intact   Pulmonary neg pulmonary ROS,  clear to auscultation  Pulmonary exam normal       Cardiovascular neg cardio ROS regular Normal    Neuro/Psych Negative Neurological ROS  Negative Psych ROS   GI/Hepatic negative GI ROS, (+) Hepatitis -, B  Endo/Other  Diabetes mellitus-, Gestational, Oral Hypoglycemic Agents and Insulin DependentMorbid obesity  Renal/GU negative Renal ROS  Genitourinary negative   Musculoskeletal   Abdominal   Peds  Hematology negative hematology ROS (+) Thrombocytopenia (plt 93), slightly elevated PT/INR, normal PTT history of malaria in 2003   Anesthesia Other Findings   Reproductive/Obstetrics (+) Pregnancy (h/o c/sx1, completely dilated, known macrosomic fetus, desires repeat C/S.)                          Anesthesia Physical Anesthesia Plan  ASA: III and Emergent  Anesthesia Plan: Spinal   Post-op Pain Management:    Induction:   Airway Management Planned:   Additional Equipment:   Intra-op Plan:   Post-operative Plan:   Informed Consent: I have reviewed the patients History and Physical, chart, labs and discussed the procedure including the risks, benefits and alternatives for the proposed anesthesia with the patient or authorized representative who has indicated his/her understanding and acceptance.   Dental Advisory Given  Plan Discussed with: CRNA and Surgeon  Anesthesia Plan Comments:         Anesthesia Quick Evaluation

## 2011-08-21 NOTE — Progress Notes (Signed)
Dr. Adrian Blackwater called to clarify order for CBG monitoring.  Dr. Adrian Blackwater ordered to check blood sugars fasting and just before lunch time.  Will continue to monitor.  Earl Gala, Linda Hedges [place]

## 2011-08-22 ENCOUNTER — Encounter (HOSPITAL_COMMUNITY): Payer: Self-pay | Admitting: Family Medicine

## 2011-08-22 LAB — CBC
HCT: 22.2 % — ABNORMAL LOW (ref 36.0–46.0)
Hemoglobin: 7 g/dL — ABNORMAL LOW (ref 12.0–15.0)
MCH: 25.2 pg — ABNORMAL LOW (ref 26.0–34.0)
MCV: 79.9 fL (ref 78.0–100.0)
RBC: 2.78 MIL/uL — ABNORMAL LOW (ref 3.87–5.11)

## 2011-08-22 NOTE — Progress Notes (Signed)
Notified Dorathy Kinsman, CNM of Platelet count of 69.  Questioned if Ibuprofen should be held.  [place] ordered to hold the Ibuprofen at this time until further ordered.  Earl Gala, Linda Hedges [place]

## 2011-08-22 NOTE — Progress Notes (Signed)
Subjective: Postpartum Day1: Cesarean Delivery Patient reports incisional pain, tolerating PO, + flatus and no problems voiding.    Objective: Vital signs in last 24 hours: Temp:  [93.5 F (34.2 C)-98.5 F (36.9 C)] 98.4 F (36.9 C) (02/14 0444) Pulse Rate:  [53-79] 67  (02/14 0444) Resp:  [16-20] 16  (02/14 0444) BP: (95-131)/(54-80) 95/56 mmHg (02/14 0444) SpO2:  [98 %-100 %] 99 % (02/14 0444)  Physical Exam:  General: alert, cooperative and no distress Lochia: appropriate Uterine Fundus: firm Incision: no significant drainage DVT Evaluation: No evidence of DVT seen on physical exam.   Basename 08/22/11 0538 08/21/11 0350  HGB 7.0* 10.5*  HCT 22.2* 33.5*    Assessment/Plan: Status post Cesarean section. Doing well postoperatively.  Continue current care.   [place], Kaedin Hicklin 08/22/2011, 7:03 AM

## 2011-08-22 NOTE — Addendum Note (Signed)
Addendum  created 08/22/11 9147 by Pat Patrick, CRNA   Modules edited:Notes Section

## 2011-08-22 NOTE — Anesthesia Postprocedure Evaluation (Signed)
  Anesthesia Post-op Note  Patient: Brenda Cannon  Procedure(s) Performed: Procedure(s) (LRB): CESAREAN SECTION (N/A)  Patient Location: PACU and Women's Unit  Anesthesia Type: Spinal  Level of Consciousness: awake, alert  and oriented  Airway and Oxygen Therapy: Patient Spontanous Breathing  Post-op Pain: mild  Post-op Assessment: Post-op Vital signs reviewed and Patient's Cardiovascular Status Stable  Post-op Vital Signs: Reviewed and stable  Complications: No apparent anesthesia complications

## 2011-08-23 ENCOUNTER — Other Ambulatory Visit: Payer: Medicaid Other

## 2011-08-23 LAB — CBC
HCT: 21.3 % — ABNORMAL LOW (ref 36.0–46.0)
MCV: 80.4 fL (ref 78.0–100.0)
RBC: 2.65 MIL/uL — ABNORMAL LOW (ref 3.87–5.11)
WBC: 6.6 10*3/uL (ref 4.0–10.5)

## 2011-08-23 MED ORDER — BISACODYL 10 MG RE SUPP
10.0000 mg | Freq: Once | RECTAL | Status: AC
  Administered 2011-08-23: 10 mg via RECTAL
  Filled 2011-08-23: qty 1

## 2011-08-23 NOTE — Progress Notes (Addendum)
Subjective: Postpartum Day 2: Cesarean Delivery Patient reports incisional pain, tolerating PO, + flatus, + BM and no problems voiding.   No dizziness, SOB, or chest pain or other signs of hypoperfussion  Objective: Vital signs in last 24 hours: Temp:  [97.9 F (36.6 C)-98.2 F (36.8 C)] 97.9 F (36.6 C) (02/15 0600) Pulse Rate:  [65-75] 75  (02/15 0600) Resp:  [18-19] 18  (02/15 0600) BP: (84-118)/(54-70) 118/70 mmHg (02/15 0600) SpO2:  [98 %] 98 % (02/14 0830)  Physical Exam:  General: alert, cooperative and no distress Lochia: appropriate Uterine Fundus: firm Incision: healing well, no significant drainage, no dehiscence, no significant erythema DVT Evaluation: No evidence of DVT seen on physical exam.   Basename 08/23/11 0530 08/22/11 0538  HGB 6.7* 7.0*  HCT 21.3* 22.2*    Assessment/Plan: Status post Cesarean section.  Pt with Hp B and thrombocytopenia. Bleeding stable hb from 7.0 to 6.7. No significant loss. No symptoms. Breastfeeding, Nexplanon for contraception. Continue current care.    D. Piloto Sherron Flemings Paz. MD PGY-1 08/23/2011, 7:46 AM

## 2011-08-24 ENCOUNTER — Encounter (HOSPITAL_COMMUNITY): Payer: Medicaid Other

## 2011-08-24 ENCOUNTER — Inpatient Hospital Stay (HOSPITAL_COMMUNITY): Admission: RE | Admit: 2011-08-24 | Payer: Medicaid Other | Source: Ambulatory Visit

## 2011-08-24 MED ORDER — OXYCODONE-ACETAMINOPHEN 5-325 MG PO TABS: 1.0000 | ORAL_TABLET | ORAL | Status: AC | PRN

## 2011-08-24 MED ORDER — IBUPROFEN 600 MG PO TABS: 600.0000 mg | ORAL_TABLET | Freq: Four times a day (QID) | ORAL | Status: AC

## 2011-08-24 NOTE — Discharge Instructions (Signed)
Wait at least 2 years before having another baby to reduce the risk of your uterine scar opening up.

## 2011-08-24 NOTE — Progress Notes (Signed)
Subjective: Postpartum Day 3: Cesarean Delivery Patient reports  tolerating PO, + flatus and no problems voiding.  Denies dizziness. Declines transfusion.  Objective: Vital signs in last 24 hours: Temp:  [98.9 F (37.2 C)-99.5 F (37.5 C)] 99.5 F (37.5 C) (02/16 0505) Pulse Rate:  [73-82] 73  (02/16 0505) Resp:  [18-20] 18  (02/16 0505) BP: (106-117)/(68-75) 106/68 mmHg (02/16 0505)  Physical Exam:  General: alert, cooperative, no distress and skin color normal for race Lochia: appropriate Uterine Fundus: firm Incision: healing well, no significant drainage, no dehiscence, no significant erythema DVT Evaluation: Negative Homan's sign.   Basename 08/23/11 0530 08/22/11 0538  HGB 6.7* 7.0*  HCT 21.3* 22.2*    Assessment/Plan: Status post Cesarean section. Doing well postoperatively.  Discharge home with standard precautions and return to clinic in 4-6 weeks. Plans Nexplanon  Dorathy Kinsman 08/24/2011, 7:38 AM

## 2011-08-24 NOTE — Discharge Summary (Signed)
Obstetric Discharge Summary Reason for Admission: onset of labor Prenatal Procedures: NST and ultrasound Intrapartum Procedures: cesarean: low cervical, transverse Postpartum Procedures: none Complications-Operative and Postpartum: hemorrhage Hemoglobin  Date Value Range Status  08/23/2011 6.7* 12.0-15.0 (g/dL) Final     CRITICAL RESULT CALLED TO, READ BACK BY AND VERIFIED WITH:     HOLLIS,N. AT 0553 ON 08/23/2011 BY HOUEGNIFIO M.     REPEATED TO VERIFY     DELTA CHECK NOTED     HCT  Date Value Range Status  08/23/2011 21.3* 36.0-46.0 (%) Final    Discharge Diagnoses: Term Pregnancy-delivered and Anemia  Discharge Information: Date: 08/24/2011 Activity: pelvic rest Diet: routine and Increase iron Medications: Ibuprofen, Percocet and Ferrous Sulfate twice a day Condition: stable Instructions: refer to practice specific booklet Discharge to: home Follow-up Information    Follow up with [facility] in 6 weeks.   Contact information:   [address] [place] [address]       Follow up with [facility]. (As needed)    Contact information:   [address] [place] [address] [phone]         Newborn Data: Live born female  Birth Weight: 8 lb 6 oz (3799 g) APGAR: 9, 9  Home with mother.  Dorathy Kinsman 08/24/2011, 7:36 AM

## 2011-08-25 NOTE — Discharge Summary (Signed)
Pt seen and note reviewed.  Pt not symptomatic with anemia.  Pt to take iron.

## 2011-09-27 ENCOUNTER — Encounter (HOSPITAL_COMMUNITY): Payer: Self-pay

## 2011-09-27 ENCOUNTER — Emergency Department (HOSPITAL_COMMUNITY)
Admission: EM | Admit: 2011-09-27 | Discharge: 2011-09-27 | Disposition: A | Discharge status: Home / Self Care | Payer: Medicaid Other | Source: Home / Self Care | Attending: Emergency Medicine | Admitting: Emergency Medicine

## 2011-09-27 DIAGNOSIS — M654 Radial styloid tenosynovitis [de Quervain]: Secondary | ICD-10-CM

## 2011-09-27 NOTE — ED Notes (Signed)
States she is 5 weeks post partum c-section, and has been having pani in her left hand and wrist for past week; c/o unable to hold baby in that arm/hand, unable to hold cup. Denies injury. Has not yet been in for her 6 week check up, has not had sex since delivery (will start on BC at that time)Cannot supinate/pronat w/o pain

## 2011-09-27 NOTE — Discharge Instructions (Signed)
Wear wrist splint continuously.    Tylenol for pain.  Apply ice 4 times daily for 15 minutes.  De Quervain's Disease Suzette Battiest disease is a condition often seen in racquet sports where there is a soreness (inflammation) in the cord like structures (tendons) which attach muscle to bone on the thumb side of the wrist. There may be a tightening of the tissuesaround the tendons. This condition is often helped by giving up or modifying the activity which caused it. When conservative treatment does not help, surgery may be required. Conservative treatment could include changes in the activity which brought about the problem or made it worse. Anti-inflammatory medications and injections may be used to help decrease the inflammation and help with pain control. Your caregiver will help you determine which is best for you. DIAGNOSIS  Often the diagnosis (learning what is wrong) can be made by examination. Sometimes x-rays are required. HOME CARE INSTRUCTIONS   Apply ice to the sore area for 15 to 20 minutes, 3 to 4 times per day while awake. Put the ice in a plastic bag and place a towel between the bag of ice and your skin. This is especially helpful if it can be done after all activities involving the sore wrist.   Temporary splinting may help.   Only take over-the-counter or prescription medicines for pain, discomfort or fever as directed by your caregiver.  SEEK MEDICAL CARE IF:   Pain relief is not obtained with medications, or if you have increasing pain and seem to be getting worse rather than better.  MAKE SURE YOU:   Understand these instructions.   Will watch your condition.   Will get help right away if you are not doing well or get worse.  Document Released: 03/19/2001 Document Revised: 06/13/2011 Document Reviewed: 06/24/2005 [facility] Patient Information 2012 [place], [place].

## 2011-09-27 NOTE — ED Provider Notes (Signed)
Chief Complaint  Patient presents with  . Wrist Pain    History of Present Illness:   The patient is a 31 year old female, mother of 68 with a 53-month-old at home whom she is breast-feeding. She has had a one-week history of left wrist pain localized over the radial styloid. The pain is worse with flexion of the wrist and particularly with flexion of the thumb. She denies any injury and. She has not had any swelling. No other joint or tendon pain. She denies numbness, tingling, or muscle weakness.  Review of Systems:  Other than noted above, the patient denies any of the following symptoms: Systemic:  No fevers, chills, sweats, or aches.  No fatigue or tiredness. Musculoskeletal:  No joint pain, arthritis, bursitis, swelling, back pain, or neck pain. Neurological:  No muscular weakness, paresthesias, headache, or trouble with speech or coordination.  No dizziness.   PMFSH:  Past medical history, family history, social history, meds, and allergies were reviewed.  Physical Exam:   Vital signs:  BP 112/83  Pulse 77  Temp(Src) 98.6 F (37 C) (Oral)  Resp 18  SpO2 100%  Breastfeeding? Yes Gen:  Alert and oriented times 3.  In no distress. Musculoskeletal: There was pain to palpation over the radial styloid and. Finkelstein's test was positive. The wrist has a full range of motion with slight pain. There was no swelling. All the digits had a full range of motion with no pain except for flexion of,. Muscle strength and sensation are normal, pulses were full. Otherwise, all joints had a full a ROM with no swelling, bruising or deformity.  No edema, pulses full. Extremities were warm and pink.  Capillary refill was brisk.  Skin:  Clear, warm and dry.  No rash. Neuro:  Alert and oriented times 3.  Muscle strength was normal.  Sensation was intact to light touch.   Assessment:   Diagnoses that have been ruled out:  None  Diagnoses that are still under consideration:  None  Final diagnoses:    Tenosynovitis, de Quervain    Plan:   1.  The following meds were prescribed:   New Prescriptions   No medications on file   2.  The patient was instructed in symptomatic care, including rest and activity, elevation, application of ice and compression.  Appropriate handouts were given. 3.  The patient was told to return if becoming worse in any way, if no better in 3 or 4 days, and given some red flag symptoms that would indicate earlier return.   4.  The patient was told to follow up in 2 weeks if no improvement. She was given a wrist splint with thumb spica. I also discussed injection with her, but she preferred not to do it since she is breast-feeding and she would have to pump her breasts and discard her milk if she got the cortisone injection. She was advised to wear the wrist splint continuously, use Tylenol for pain, and apply ice 4 times daily for 15 minutes.   Reuben Likes, MD 09/27/11 [phone]

## 2011-09-30 ENCOUNTER — Ambulatory Visit: Payer: Medicaid Other | Admitting: Family

## 2011-10-09 ENCOUNTER — Emergency Department (INDEPENDENT_AMBULATORY_CARE_PROVIDER_SITE_OTHER)
Admission: EM | Admit: 2011-10-09 | Discharge: 2011-10-09 | Disposition: A | Discharge status: Home / Self Care | Payer: Medicaid Other | Source: Home / Self Care | Attending: Emergency Medicine | Admitting: Emergency Medicine

## 2011-10-09 ENCOUNTER — Encounter (HOSPITAL_COMMUNITY): Payer: Self-pay | Admitting: Emergency Medicine

## 2011-10-09 DIAGNOSIS — S61459A Open bite of unspecified hand, initial encounter: Secondary | ICD-10-CM

## 2011-10-09 DIAGNOSIS — S61409A Unspecified open wound of unspecified hand, initial encounter: Secondary | ICD-10-CM

## 2011-10-09 HISTORY — DX: Radial styloid tenosynovitis (de quervain): M65.4

## 2011-10-09 MED ORDER — AMOXICILLIN-POT CLAVULANATE 875-125 MG PO TABS: 1.0000 | ORAL_TABLET | Freq: Two times a day (BID) | ORAL | Status: AC

## 2011-10-09 MED ORDER — TETANUS-DIPHTHERIA TOXOIDS TD 5-2 LFU IM INJ
INJECTION | INTRAMUSCULAR | Status: AC
  Filled 2011-10-09: qty 0.5

## 2011-10-09 MED ORDER — TETANUS-DIPHTH-ACELL PERTUSSIS 5-2.5-18.5 LF-MCG/0.5 IM SUSP
0.5000 mL | Freq: Once | INTRAMUSCULAR | Status: AC
  Administered 2011-10-09: 0.5 mL via INTRAMUSCULAR

## 2011-10-09 MED ORDER — BACITRACIN 500 UNIT/GM EX OINT
1.0000 "application " | TOPICAL_OINTMENT | Freq: Once | CUTANEOUS | Status: AC
  Administered 2011-10-09: 1 via TOPICAL

## 2011-10-09 MED ORDER — IBUPROFEN 600 MG PO TABS: 600.0000 mg | ORAL_TABLET | Freq: Four times a day (QID) | ORAL | Status: AC | PRN

## 2011-10-09 NOTE — Discharge Instructions (Signed)
Make sure you finish all the antibiotics, even if you feel better. Because you are currently having infection your hand, it is not a good time to have the tendon injection. Continue to wear the brace, take 1 g of Tylenol as needed for pain You may take this with the 600 mg Motrin up to 4 times a day. He may follow up with you in a hand specialist with the [facility] if your wrist is still bothering you after taking the Motrin, continue Tylenol and respond.
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
## 2011-10-11 ENCOUNTER — Ambulatory Visit (INDEPENDENT_AMBULATORY_CARE_PROVIDER_SITE_OTHER): Payer: Medicaid Other | Admitting: Physician Assistant

## 2011-10-11 ENCOUNTER — Encounter: Payer: Self-pay | Admitting: Physician Assistant

## 2011-10-11 NOTE — Progress Notes (Signed)
  Subjective:     Brenda Cannon is a 31 y.o. female who presents for a postpartum visit. She is 5 week postpartum following a low cervical transverse Cesarean section. I have fully reviewed the prenatal and intrapartum course. The delivery was at 38.4 gestational weeks. Outcome: primary cesarean section, low transverse incision and cesarean indication: Pt was a TOLAC and chose to have C-section while laboring.  . Anesthesia: epidural and spinal. At time of C-section, pt was found to have low plts and nl coag studies. Will repeat plt count during today's visit. Pt has no concerns about postpartum course. Baby's course has been uneventful and pt sts her baby has been doing well. Baby is feeding by both breast and bottle. Bleeding no bleeding. Pt sts she restarted her menstrual cycle on 10/08/11. Bowel function is normal. Bladder function is normal. Patient is not sexually active. Contraception method is Nexplanon. She sts she has an appointment for implantation [facility] the [facility] on 11[facility]02/11.  Postpartum depression screening: negative. Pt has concerns over left wrist pain which began during pregnancy. She reports the pain becoming worse with moving/using her wrist especially with grasping objects. Pt often rubs over the radial styloid when describing the pain. She states that the pain improves with tylenol and rest.   The following portions of the patient's history were reviewed and updated as appropriate: allergies, current medications, past family history, past medical history, past social history, past surgical history and problem list.  Review of Systems Pertinent items are noted in HPI.   Objective:    BP 106/71  Pulse 77  Temp(Src) 98.1 F (36.7 C) (Oral)  Wt 74.662 kg (164 lb 9.6 oz)  LMP 10/08/2011  Breastfeeding? Yes  General:  alert, cooperative and appears stated age  Extremities:  At radial aspect of left wrist: Tenderness to palpation over radial aspect. Tenderness and thickening at the radial  styloid. Positive de Quervain's test. No erythema, ecchymosis or swelling noted.    Abdomen: soft, non-tender; bowel sounds normal; no masses,  no organomegaly  Transverse incision is well healed.         Assessment:    Normal postpartum exam. Pap smear not done at today's visit.   Plan:    1. Contraception: Nexplanon She sts she has an appointment for implantation at the [facility] on 11/02/11 2. Suzette Battiest of left Wrist: tPt instructed to continue the use of tylenol and to try using a wrist spilt to help give her left wrist support. Pt informed to f/u in 2 months with Primary Care if her wrist pain does not improve.  3. Follow up: As needed.   I have seen and examined this patient and agree with the student's above assessment. SHORES,SUZANNE E. 10/11/2011.11:25 AM

## 2011-10-11 NOTE — Progress Notes (Signed)
Patient reports pain occasionally in left wrist since delivery=7.

## 2011-10-11 NOTE — Patient Instructions (Signed)
Contraception Choices Birth control (contraception) can stop pregnancy from happening. Different types of birth control work in different ways. Some can:  Make the mucus in the cervix thick. This makes it hard for sperm to get into the uterus.   Thin the lining of the uterus. This makes it hard for an egg to attach to the wall of the uterus.   Stop the ovaries from releasing an egg.   Block the sperm from reaching the egg.  Certain types of surgery can stop pregnancy from happening. For women, the sugery closes the fallopian tubes (tubal ligation). For men, the surgery stops sperm from releasing during sex (vasectomy). HORMONAL BIRTH CONTROL Hormonal birth control stops pregnancy by putting hormones into your body. Types of birth control include:  A small tube put under the skin of the upper arm (implant). The tube can stay in place for 3 years.   Shots given every 3 months.   Pills taken every day or once after sex (intercourse).   Patches that are changed once a week.   A ring put into the vagina (vaginal ring). The ring is left in place for 3 weeks and removed for 1 week. Then, a new ring is put in the vagina.  BARRIER BIRTH CONTROL  Barrier birth control blocks sperm from reaching the egg. Types of birth control include:   A thin covering worn on the penis (female condom) during sex.   A soft, loose covering put into the vagina (female condom) before sex.   A rubber bowl that sits over the cervix (diaphragm). The bowl must be made for you. The bowl is put into the vagina before sex. The bowl is left in place for 6 to 8 hours after sex.   A small, soft cup that fits over the cervix (cervical cap). The cup must be made for you. The cup can be left in place for 48 hours after sex.   A sponge that is put into the vagina before sex.   A chemical that kills or blocks sperm from getting into the cervix and uterus (spermicide). The chemical may be a cream, jelly, foam, or pill.    INTRAUTERINE (IUD) BIRTH CONTROL  IUD birth control is a small, T-shaped piece of plastic. The plastic is put inside the uterus. There are 2 types of IUD:  Copper IUD. The IUD is covered in copper wire. The copper makes a fluid that kills sperm. It can stay in place for 10 years.   Hormone IUD. The hormone stops pregnancy from happening. It can stay in place for 5 years.  NATURAL FAMILY PLANNING BIRTH CONTROL  Natural family planning means not having sex or using barrier birth control when the woman is fertile. A woman can:  Use a calendar to keep track of when she is fertile.   Use a thermometer to measure her body temperature.  Protect yourself against sexual diseases no matter what type of birth control you use. Talk to your doctor about which type of birth control is best for you. Document Released: 04/21/2009 Document Revised: 06/13/2011 Document Reviewed: 10/31/2010 [employer] Patient Information 2012 [employer]. 

## 2012-05-25 ENCOUNTER — Encounter (HOSPITAL_COMMUNITY): Payer: Self-pay | Admitting: Emergency Medicine

## 2012-05-25 ENCOUNTER — Emergency Department (HOSPITAL_COMMUNITY)
Admission: EM | Admit: 2012-05-25 | Discharge: 2012-05-25 | Disposition: A | Discharge status: Home / Self Care | Payer: Self-pay | Source: Home / Self Care

## 2012-05-25 DIAGNOSIS — E860 Dehydration: Secondary | ICD-10-CM

## 2012-05-25 DIAGNOSIS — J309 Allergic rhinitis, unspecified: Secondary | ICD-10-CM

## 2012-05-25 MED ORDER — LORATADINE 10 MG PO CAPS: 1.0000 | ORAL_CAPSULE | Freq: Every day | ORAL | Status: DC

## 2012-05-25 MED ORDER — FLUTICASONE PROPIONATE 50 MCG/ACT NA SUSP: 2.0000 | Freq: Every day | NASAL | Status: DC

## 2012-05-25 NOTE — ED Provider Notes (Signed)
History     CSN: [id number]  Arrival date & time 05/25/12  1759   None     Chief Complaint  Patient presents with  . Dizziness  . Headache    (Consider location/radiation/quality/duration/timing/severity/associated sxs/prior treatment) HPI Pt presents reporting that she is having 1 week of nasal congestion and drainage, post nasal drainage, no fever or chills, some loose stools, no vomiting or nausea    Past Medical History  Diagnosis Date  . Gestational diabetes     GDM with last pregnancy (2008)  . History of hepatitis B 2007  . History of malaria 2003  . Difficulty reading   . History of gestational diabetes   . Obese   . History of thrombocytopenia   . Abnormal Pap smear     ascus with High Risk for HPV  . De Quervain's tenosynovitis, left     Past Surgical History  Procedure Date  . Cesarean section 2008    last pregnacy  . Cesarean section 08/21/2011    Procedure: CESAREAN SECTION;  Surgeon: Reva Bores, MD;  Location: [facility];  Service: Gynecology;  Laterality: N/A;    Family History  Problem Relation Age of Onset  . Hypertension Mother     History  Substance Use Topics  . Smoking status: Never Smoker   . Smokeless tobacco: Never Used  . Alcohol Use: No    OB History    Grav Para Term Preterm Abortions TAB SAB Ect Mult Living   5 3 3  0 2  1 0 0 3      Review of Systems  Constitutional: Negative.   HENT: Positive for congestion, rhinorrhea and sneezing. Negative for hearing loss, neck pain and ear discharge.   Eyes: Negative.   Respiratory: Negative.   Cardiovascular: Negative.   Genitourinary: Negative.   Musculoskeletal: Negative.   Hematological: Negative.   Psychiatric/Behavioral: Negative.   All other systems reviewed and are negative.    Allergies  Cephalexin and Chloroquine  Home Medications   Current Outpatient Rx  Name  Route  Sig  Dispense  Refill  . FLUTICASONE PROPIONATE 50 MCG/ACT NA SUSP   Nasal   Place 2 sprays  into the nose daily.   16 g   2   . LORATADINE 10 MG PO CAPS   Oral   Take 1 capsule (10 mg total) by mouth daily.   30 each   3     BP 105/68  Pulse 76  Temp 98.4 F (36.9 C) (Oral)  Resp 14  SpO2 100%  LMP 04/07/2012  Physical Exam  Constitutional: She appears well-developed and well-nourished.  HENT:  Head: Atraumatic.  Nose: Mucosal edema, rhinorrhea and sinus tenderness present. No nose lacerations, nasal deformity or nasal septal hematoma.  No foreign bodies.  Eyes: Conjunctivae normal are normal. Pupils are equal, round, and reactive to light.  Neck: Normal range of motion.  Cardiovascular: Normal rate and regular rhythm.   Abdominal: Soft. Bowel sounds are normal.  Musculoskeletal: Normal range of motion.  Neurological: She is alert. She has normal reflexes.  Skin: Skin is warm and dry.    ED Course  Procedures (including critical care time)  Labs Reviewed - No data to display No results found.   1. Allergic rhinitis   2. Dehydration     MDM  Trial of loratadine 10 mg daily Flonase Nasal Spray daily Extra fluids Rest Return if symptoms worsen or don't improve or new problems develop.  Cleora Fleet, MD 05/25/12 1942

## 2012-05-25 NOTE — ED Notes (Signed)
Pt here with c/o gradual dizziness that worsens in the evening followed by frontal headache.denies syncope or n/v.LMP OCT.1,2013.also states' i feel tired all the time and im not sleeping well'.orthostats negative.vss.no medical problems noted

## 2012-05-26 DIAGNOSIS — J309 Allergic rhinitis, unspecified: Secondary | ICD-10-CM | POA: Insufficient documentation

## 2012-06-28 ENCOUNTER — Encounter (HOSPITAL_COMMUNITY): Payer: Self-pay | Admitting: *Deleted

## 2012-06-28 ENCOUNTER — Emergency Department (HOSPITAL_COMMUNITY)
Admission: EM | Admit: 2012-06-28 | Discharge: 2012-06-29 | Disposition: A | Discharge status: Home / Self Care | Payer: Medicaid Other | Attending: Emergency Medicine | Admitting: Emergency Medicine

## 2012-06-28 DIAGNOSIS — R220 Localized swelling, mass and lump, head: Secondary | ICD-10-CM | POA: Insufficient documentation

## 2012-06-28 DIAGNOSIS — R221 Localized swelling, mass and lump, neck: Secondary | ICD-10-CM | POA: Insufficient documentation

## 2012-06-28 DIAGNOSIS — J020 Streptococcal pharyngitis: Secondary | ICD-10-CM

## 2012-06-28 NOTE — ED Notes (Addendum)
Pt to ED c/o painful bump to R pharynx.  Pt states she couldn't visualize it, but said she could feel it with her finger. Denies cough.  Pt also c/o R ear/head pain.

## 2012-06-29 ENCOUNTER — Encounter (HOSPITAL_COMMUNITY): Payer: Self-pay | Admitting: Emergency Medicine

## 2012-06-29 ENCOUNTER — Emergency Department (HOSPITAL_COMMUNITY): Payer: Medicaid Other

## 2012-06-29 LAB — POCT I-STAT, CHEM 8
Chloride: 105 mEq/L (ref 96–112)
HCT: 39 % (ref 36.0–46.0)
Potassium: 3.5 mEq/L (ref 3.5–5.1)

## 2012-06-29 LAB — RAPID STREP SCREEN (MED CTR MEBANE ONLY): Streptococcus, Group A Screen (Direct): POSITIVE — AB

## 2012-06-29 LAB — CBC WITH DIFFERENTIAL/PLATELET
Basophils Absolute: 0 10*3/uL (ref 0.0–0.1)
Basophils Relative: 0 % (ref 0–1)
Hemoglobin: 12.9 g/dL (ref 12.0–15.0)
MCHC: 33.9 g/dL (ref 30.0–36.0)
Neutro Abs: 3.5 10*3/uL (ref 1.7–7.7)
Neutrophils Relative %: 74 % (ref 43–77)
RDW: 13.7 % (ref 11.5–15.5)
WBC: 5 10*3/uL (ref 4.0–10.5)

## 2012-06-29 MED ORDER — PENICILLIN V POTASSIUM 500 MG PO TABS: 500.0000 mg | ORAL_TABLET | Freq: Four times a day (QID) | ORAL | Status: DC

## 2012-06-29 MED ORDER — ONDANSETRON HCL 4 MG/2ML IJ SOLN
4.0000 mg | Freq: Once | INTRAMUSCULAR | Status: AC
  Administered 2012-06-29: 4 mg via INTRAVENOUS
  Filled 2012-06-29: qty 2

## 2012-06-29 MED ORDER — HYDROMORPHONE HCL PF 1 MG/ML IJ SOLN
0.5000 mg | Freq: Once | INTRAMUSCULAR | Status: AC
  Administered 2012-06-29: 0.5 mg via INTRAVENOUS
  Filled 2012-06-29: qty 1

## 2012-06-29 MED ORDER — IBUPROFEN 800 MG PO TABS: 800.0000 mg | ORAL_TABLET | Freq: Three times a day (TID) | ORAL | Status: DC | PRN

## 2012-06-29 MED ORDER — IOHEXOL 300 MG/ML  SOLN
75.0000 mL | Freq: Once | INTRAMUSCULAR | Status: AC | PRN
  Administered 2012-06-29: 75 mL via INTRAVENOUS

## 2012-06-29 MED ORDER — IBUPROFEN 400 MG PO TABS
800.0000 mg | ORAL_TABLET | Freq: Once | ORAL | Status: AC
  Administered 2012-06-29: 800 mg via ORAL
  Filled 2012-06-29: qty 2

## 2012-06-29 MED ORDER — DEXAMETHASONE 1 MG/ML PO CONC
10.0000 mg | Freq: Once | ORAL | Status: DC
  Filled 2012-06-29: qty 10

## 2012-06-29 MED ORDER — PENICILLIN V POTASSIUM 250 MG PO TABS
500.0000 mg | ORAL_TABLET | Freq: Once | ORAL | Status: AC
  Administered 2012-06-29: 500 mg via ORAL
  Filled 2012-06-29: qty 2

## 2012-06-29 MED ORDER — DEXAMETHASONE 10 MG/ML FOR PEDIATRIC ORAL USE
10.0000 mg | Freq: Once | INTRAMUSCULAR | Status: AC
  Administered 2012-06-29: 10 mg via ORAL
  Filled 2012-06-29: qty 1

## 2012-06-29 NOTE — ED Notes (Signed)
Difficult to visualize tonsils, though pharynx appears red.  Blood noted on strep lab sample.  Pt denies cough, but states pain when swallowing, R ear pain.

## 2012-06-29 NOTE — ED Provider Notes (Signed)
History     CSN: [id number]  Arrival date & time 06/28/12  2334   First MD Initiated Contact with Patient 06/28/12 2339      Chief Complaint  Patient presents with  . Sore Throat    (Consider location/radiation/quality/duration/timing/severity/associated sxs/prior treatment) HPI Comments: Patient with a sore throat for one day.  States, that it hurt really a lot.  She been putting her finger down.  Her throat, particularly on the right side, she, feels, there's a lump in her has not taken any over-the-counter medication.  She has no local physician  The history is provided by the patient.    History reviewed. No pertinent past medical history.  History reviewed. No pertinent past surgical history.  No family history on file.  History  Substance Use Topics  . Smoking status: Never Smoker   . Smokeless tobacco: Not on file  . Alcohol Use: No    OB History    Grav Para Term Preterm Abortions TAB SAB Ect Mult Living                  Review of Systems  Constitutional: Negative for fever and chills.  HENT: Positive for sore throat and trouble swallowing. Negative for rhinorrhea.   Respiratory: Negative for shortness of breath.   Gastrointestinal: Negative for nausea.  Neurological: Negative.     Allergies  Chloroquine  Home Medications   Current Outpatient Rx  Name  Route  Sig  Dispense  Refill  . ACETAMINOPHEN 325 MG PO TABS   Oral   Take 650 mg by mouth every 6 (six) hours as needed. For pain         . IBUPROFEN 800 MG PO TABS   Oral   Take 1 tablet (800 mg total) by mouth every 8 (eight) hours as needed for pain.   30 tablet   0   . MENTHOL 3 MG MT LOZG   Oral   Take 1 lozenge by mouth as needed. For sore throat         . PENICILLIN V POTASSIUM 500 MG PO TABS   Oral   Take 1 tablet (500 mg total) by mouth 4 (four) times daily.   39 tablet   0   . NYQUIL PO   Oral   Take 30 mLs by mouth daily as needed. For sore throat           BP  119/70  Pulse 96  Temp 99.6 F (37.6 C) (Oral)  Resp 20  SpO2 100%  LMP 09/27/2011  Physical Exam  Nursing note and vitals reviewed. Constitutional: She appears well-developed and well-nourished.  HENT:  Head: Normocephalic.  Right Ear: External ear normal.       Enlarged tonsils bilaterally, right significantly greater than left  Eyes: Pupils are equal, round, and reactive to light.  Cardiovascular: Normal rate.   Pulmonary/Chest: Effort normal.  Abdominal: Soft. She exhibits no distension.  Lymphadenopathy:    She has cervical adenopathy.  Neurological: She is alert.  Skin: Skin is warm.    ED Course  Procedures (including critical care time)  Labs Reviewed  RAPID STREP SCREEN - Abnormal; Notable for the following:    Streptococcus, Group A Screen (Direct) POSITIVE (*)     All other components within normal limits  CBC WITH DIFFERENTIAL - Abnormal; Notable for the following:    Platelets 97 (*)  PLATELET COUNT CONFIRMED BY SMEAR   All other components within normal limits  POCT I-STAT,  CHEM 8 - Abnormal; Notable for the following:    Glucose, Bld 182 (*)     All other components within normal limits   Ct Soft Tissue Neck W Contrast  06/29/2012  *RADIOLOGY REPORT*  Clinical Data: Sore throat and right peritonsillar swelling.  Pain on swallowing, and right ear pain.  CT NECK WITH CONTRAST  Technique:  Multidetector CT imaging of the neck was performed with intravenous contrast.  Contrast: 75mL OMNIPAQUE IOHEXOL 300 MG/ML  SOLN  Comparison: None.  Findings: Bilateral edema is noted involving the palatine tonsils, more prominent on the right, with scattered trace associated fluid but no drainable abscess yet seen.  There is associated significant narrowing of the inferior nasopharynx and oropharynx.  The parapharyngeal fat planes are preserved.  There is also prominence of the adenoids, without evidence of abscess.  The hypopharynx is unremarkable in appearance.  There is  slight asymmetry of the piriform sinuses, nonspecific in appearance.  The prevertebral soft tissues are within normal limits.  There is no evidence of vascular compromise.  Scattered cervical nodes remain normal in size; no cervical lymphadenopathy is seen. The parotid and submandibular glands are grossly unremarkable in appearance.  The thyroid gland is unremarkable.  The superior mediastinum is within normal limits.  The visualized lung apices are clear.  No acute osseous abnormalities are identified.  The visualized paranasal sinuses and mastoid air cells are well-aerated.  The orbits are grossly unremarkable in appearance.  The visualized portions of the brain are unremarkable.  IMPRESSION:  1.  Edema involving the palatine tonsils bilaterally, and diffuse prominence of the adenoids.  The right palatine tonsil is more prominent, with scattered trace associated fluid but no drainable abscess yet seen.  Associated significant narrowing of the inferior nasopharynx and oropharynx. 2.  No evidence of cervical lymphadenopathy.  No evidence of vascular compromise.   Original Report Authenticated By: Tonia Ghent, M.D.      1. Strep pharyngitis       MDM  To to the asymmetry of her tonsil enlargement.  Will CT scan to rule out pharyngeal abscess Patient opted for penicillin 500 mg tablets for the next 10 days, rather than an injection        Arman Filter, NP 06/29/12 0303  Arman Filter, NP 06/29/12 0303  Arman Filter, NP 06/29/12 0305  Arman Filter, NP 06/29/12 [phone]

## 2012-06-29 NOTE — ED Notes (Signed)
Patient transported to CT 

## 2012-07-08 NOTE — ED Provider Notes (Signed)
Medical screening examination/treatment/procedure(s) were performed by non-physician practitioner and as supervising physician I was immediately available for consultation/collaboration.    Vida Roller, MD 07/08/12 [phone]

## 2012-09-10 ENCOUNTER — Encounter (HOSPITAL_COMMUNITY): Payer: Self-pay | Admitting: Emergency Medicine

## 2012-09-10 ENCOUNTER — Emergency Department (INDEPENDENT_AMBULATORY_CARE_PROVIDER_SITE_OTHER)
Admission: EM | Admit: 2012-09-10 | Discharge: 2012-09-10 | Disposition: A | Discharge status: Home / Self Care | Payer: Self-pay | Source: Home / Self Care

## 2012-09-10 DIAGNOSIS — H8309 Labyrinthitis, unspecified ear: Secondary | ICD-10-CM

## 2012-09-10 DIAGNOSIS — J069 Acute upper respiratory infection, unspecified: Secondary | ICD-10-CM

## 2012-09-10 MED ORDER — MECLIZINE HCL 12.5 MG PO TABS: 12.5000 mg | ORAL_TABLET | Freq: Three times a day (TID) | ORAL | Status: DC | PRN

## 2012-09-10 MED ORDER — PHENYLEPHRINE-CHLORPHEN-DM 10-4-12.5 MG/5ML PO LIQD: 5.0000 mL | ORAL | Status: DC | PRN

## 2012-09-10 NOTE — ED Provider Notes (Signed)
Medical screening examination/treatment/procedure(s) were performed by non-physician practitioner and as supervising physician I was immediately available for consultation/collaboration.  Leslee Home, M.D.  Reuben Likes, MD 09/10/12 2022

## 2012-09-10 NOTE — ED Provider Notes (Signed)
History     CSN: [id number]  Arrival date & time 09/10/12  1013   First MD Initiated Contact with Patient 09/10/12 1014      Chief Complaint  Patient presents with  . URI    (Consider location/radiation/quality/duration/timing/severity/associated sxs/prior treatment) HPI Comments: 32 year old female presents with "cold" she has had upper respiratory congestion symptoms for one to 2 weeks. Is associated with tiredness, weakness and dizziness. She also has a frontal headache, watery any GI and a dry cough.. She denies earache, sore throat or fever. Dizziness is often affected with head movement and body position changes. She states she has not taken any medications for symptoms.   Past Medical History  Diagnosis Date  . Gestational diabetes     GDM with last pregnancy (2008)  . History of hepatitis B 2007  . History of malaria 2003  . Difficulty reading   . History of gestational diabetes   . Obese   . History of thrombocytopenia   . Abnormal Pap smear     ascus with High Risk for HPV  . De Quervain's tenosynovitis, left     Past Surgical History  Procedure Laterality Date  . Cesarean section  2008    last pregnacy  . Cesarean section  08/21/2011    Procedure: CESAREAN SECTION;  Surgeon: Reva Bores, MD;  Location: [facility];  Service: Gynecology;  Laterality: N/A;    Family History  Problem Relation Age of Onset  . Hypertension Mother     History  Substance Use Topics  . Smoking status: Never Smoker   . Smokeless tobacco: Not on file  . Alcohol Use: No    OB History   Grav Para Term Preterm Abortions TAB SAB Ect Mult Living   5 3 3  0 2  1 0 0 3      Review of Systems  Constitutional: Positive for fatigue. Negative for fever, chills, activity change and appetite change.  HENT: Positive for congestion, rhinorrhea and postnasal drip. Negative for sore throat, facial swelling, neck pain and neck stiffness.   Eyes: Negative.   Respiratory: Positive for cough.  Negative for chest tightness, shortness of breath, wheezing and stridor.   Cardiovascular: Negative.        Several nights ago she notes that she had retrosternal sharp and intermittent chest pain. It was relieved after drinking milk. There was no heaviness, tightness, fullness, pressure, sweating, shortness of breath. She has not had a repeat occurrence.  Gastrointestinal: Negative.   Genitourinary: Negative.   Musculoskeletal: Negative.   Skin: Negative for pallor and rash.  Neurological: Negative.   Psychiatric/Behavioral: Negative.     Allergies  Chloroquine; Cephalexin; and Chloroquine  Home Medications   Current Outpatient Rx  Name  Route  Sig  Dispense  Refill  . acetaminophen (TYLENOL) 325 MG tablet   Oral   Take 650 mg by mouth every 6 (six) hours as needed. For pain         . fluticasone (FLONASE) 50 MCG/ACT nasal spray   Nasal   Place 2 sprays into the nose daily.   16 g   2   . ibuprofen (ADVIL,MOTRIN) 800 MG tablet   Oral   Take 1 tablet (800 mg total) by mouth every 8 (eight) hours as needed for pain.   30 tablet   0   . Loratadine 10 MG CAPS   Oral   Take 1 capsule (10 mg total) by mouth daily.   30 each  3   . meclizine (ANTIVERT) 12.5 MG tablet   Oral   Take 1 tablet (12.5 mg total) by mouth 3 (three) times daily as needed for dizziness.   30 tablet   0   . menthol-cetylpyridinium (CEPACOL REGULAR STRENGTH) 3 MG lozenge   Oral   Take 1 lozenge by mouth as needed. For sore throat         . penicillin v potassium (VEETID) 500 MG tablet   Oral   Take 1 tablet (500 mg total) by mouth 4 (four) times daily.   39 tablet   0   . Phenylephrine-Chlorphen-DM 04-11-11.5 MG/5ML LIQD   Oral   Take 5 mLs by mouth every 4 (four) hours as needed.   120 mL   0   . Pseudoeph-Doxylamine-DM-APAP (NYQUIL PO)   Oral   Take 30 mLs by mouth daily as needed. For sore throat           BP 103/63  Pulse 64  Temp(Src) 97.9 F (36.6 C) (Oral)  Resp 20   SpO2 100%  Breastfeeding? No  Physical Exam  Constitutional: She is oriented to person, place, and time. She appears well-developed and well-nourished. No distress.  HENT:  Lateral TMs are normal Oropharynx with minor erythema, clear PND and no exudates.  Neck: Normal range of motion. Neck supple.  Cardiovascular: Normal rate and regular rhythm.   Pulmonary/Chest: Effort normal and breath sounds normal. No respiratory distress.  Abdominal: Soft. There is no tenderness.  Musculoskeletal: Normal range of motion. She exhibits no edema.  Lymphadenopathy:    She has no cervical adenopathy.  Neurological: She is alert and oriented to person, place, and time. She exhibits normal muscle tone.  Skin: Skin is warm and dry. No rash noted.  Psychiatric: She has a normal mood and affect.    ED Course  Procedures (including critical care time)  Labs Reviewed - No data to display No results found.   1. URI (upper respiratory infection)   2. Labyrinthitis, unspecified laterality   3. Sinusitis nasal       MDM  Norell CS 1 teaspoon every 4 hours when necessary URI symptoms Antivert 12 and half milligrams 3 times a day when necessary dizziness Plenty of fluids and stay well-hydrated Rest. Recommend not taking the cough and cold medication while working or driving. Any new symptoms problems or worsening may return. It is recommended she obtain a primary care doctor for followup, Minor illnesses, health maintenance.        Hayden Rasmussen, NP 09/10/12 1116

## 2012-09-10 NOTE — ED Notes (Addendum)
C/o uri that started 2 weeks ago.  Reports sneezing, c/o dizziness, headache, itchy eyes.  Denies n/v/d/  denies fever.  Patient states main concern is feeling dizzy and weak.

## 2012-10-21 ENCOUNTER — Emergency Department (INDEPENDENT_AMBULATORY_CARE_PROVIDER_SITE_OTHER)
Admission: EM | Admit: 2012-10-21 | Discharge: 2012-10-21 | Disposition: A | Discharge status: Home / Self Care | Payer: Self-pay | Source: Home / Self Care

## 2012-10-21 ENCOUNTER — Encounter (HOSPITAL_COMMUNITY): Payer: Self-pay | Admitting: *Deleted

## 2012-10-21 DIAGNOSIS — J309 Allergic rhinitis, unspecified: Secondary | ICD-10-CM

## 2012-10-21 DIAGNOSIS — J029 Acute pharyngitis, unspecified: Secondary | ICD-10-CM

## 2012-10-21 MED ORDER — PREDNISONE 20 MG PO TABS: ORAL_TABLET | ORAL | Status: DC

## 2012-10-21 MED ORDER — FLUTICASONE PROPIONATE 50 MCG/ACT NA SUSP: 2.0000 | Freq: Every day | NASAL | Status: DC

## 2012-10-21 NOTE — ED Provider Notes (Signed)
History     CSN: [id number]  Arrival date & time 10/21/12  1219   First MD Initiated Contact with Patient 10/21/12 1245      Chief Complaint  Patient presents with  . Sore Throat    (Consider location/radiation/quality/duration/timing/severity/associated sxs/prior treatment) HPI Comments: 32 year old female complaining of sore throat, itchy throat, headache, nasal congestion, PND, having to clear her throat frequently. Ears stopped up. Denies fever, chills or GI symptoms.   Past Medical History  Diagnosis Date  . Gestational diabetes     GDM with last pregnancy (2008)  . History of hepatitis B 2007  . History of malaria 2003  . Difficulty reading   . History of gestational diabetes   . Obese   . History of thrombocytopenia   . Abnormal Pap smear     ascus with High Risk for HPV  . De Quervain's tenosynovitis, left     Past Surgical History  Procedure Laterality Date  . Cesarean section  2008    last pregnacy  . Cesarean section  08/21/2011    Procedure: CESAREAN SECTION;  Surgeon: Reva Bores, MD;  Location: [facility];  Service: Gynecology;  Laterality: N/A;    Family History  Problem Relation Age of Onset  . Hypertension Mother     History  Substance Use Topics  . Smoking status: Never Smoker   . Smokeless tobacco: Not on file  . Alcohol Use: No    OB History   Grav Para Term Preterm Abortions TAB SAB Ect Mult Living   5 3 3  0 2  1 0 0 3      Review of Systems  Constitutional: Negative for fever, chills, activity change, appetite change and fatigue.  HENT: Positive for congestion, sore throat, rhinorrhea and postnasal drip. Negative for facial swelling, neck pain and neck stiffness.   Eyes: Negative.   Respiratory: Negative.   Cardiovascular: Negative.   Gastrointestinal: Negative.   Musculoskeletal: Negative.   Skin: Negative for pallor and rash.  Allergic/Immunologic: Positive for environmental allergies.  Neurological: Negative.     Allergies   Chloroquine; Cephalexin; and Chloroquine  Home Medications   Current Outpatient Rx  Name  Route  Sig  Dispense  Refill  . acetaminophen (TYLENOL) 325 MG tablet   Oral   Take 650 mg by mouth every 6 (six) hours as needed. For pain         . fluticasone (FLONASE) 50 MCG/ACT nasal spray   Nasal   Place 2 sprays into the nose daily.   16 g   2   . fluticasone (FLONASE) 50 MCG/ACT nasal spray   Nasal   Place 2 sprays into the nose daily.   1 g   2   . ibuprofen (ADVIL,MOTRIN) 800 MG tablet   Oral   Take 1 tablet (800 mg total) by mouth every 8 (eight) hours as needed for pain.   30 tablet   0   . Loratadine 10 MG CAPS   Oral   Take 1 capsule (10 mg total) by mouth daily.   30 each   3   . meclizine (ANTIVERT) 12.5 MG tablet   Oral   Take 1 tablet (12.5 mg total) by mouth 3 (three) times daily as needed for dizziness.   30 tablet   0   . menthol-cetylpyridinium (CEPACOL REGULAR STRENGTH) 3 MG lozenge   Oral   Take 1 lozenge by mouth as needed. For sore throat         .  penicillin v potassium (VEETID) 500 MG tablet   Oral   Take 1 tablet (500 mg total) by mouth 4 (four) times daily.   39 tablet   0   . Phenylephrine-Chlorphen-DM 04-11-11.5 MG/5ML LIQD   Oral   Take 5 mLs by mouth every 4 (four) hours as needed.   120 mL   0   . predniSONE (DELTASONE) 20 MG tablet      Three tabs today with food, 2 tabs q day for 4 days, then 1 q d for 2 days.   13 tablet   0   . Pseudoeph-Doxylamine-DM-APAP (NYQUIL PO)   Oral   Take 30 mLs by mouth daily as needed. For sore throat           BP 110/75  Pulse 72  Temp(Src) 98.6 F (37 C) (Oral)  Resp 18  SpO2 100%  Physical Exam  Nursing note and vitals reviewed. Constitutional: She is oriented to person, place, and time. She appears well-developed and well-nourished. No distress.  HENT:  Minor TM retraction bilateral. No erythema or bulging. Oropharynx with minor erythema and clear PND. No exudates or  swelling.  Eyes: Conjunctivae and EOM are normal.  Neck: Normal range of motion. Neck supple.  Cardiovascular: Normal rate, regular rhythm and normal heart sounds.   Pulmonary/Chest: Effort normal and breath sounds normal. No respiratory distress. She has no wheezes. She has no rales.  Musculoskeletal: Normal range of motion. She exhibits no edema.  Lymphadenopathy:    She has no cervical adenopathy.  Neurological: She is alert and oriented to person, place, and time.  Skin: Skin is warm and dry. No rash noted.  Psychiatric: She has a normal mood and affect.    ED Course  Procedures (including critical care time)  Labs Reviewed - No data to display No results found.   1. Allergic rhinitis due to allergen   2. Pharyngitis       MDM  Fluticasone nasal spray as directed Prednisone 60 mg toDAY, , 40 mg days 2-3 and 4, 20 mg a day for 2 days. Take with food. Claritin 10 mg daily Sudafed PE 10 mg every 4-6 hours when necessary congestion Copious amounts of nasal saline spray throughout the day and night. Tylenol every 4 hours as needed for discomfort. May also continue to use the throat spray. Drink plenty of fluids.        Hayden Rasmussen, NP 10/21/12 1334

## 2012-10-21 NOTE — ED Notes (Signed)
Pt  Reports   Multiple   Symptoms   She  Reports a   sorethroat  With  Pain  When  She  Swallows       She  Also  Reports  A  Cough   She  Is  Sitting  Upright on  Exam table  She  Is  Speaking in  Complete  sentances   Symptoms   X  1  Week

## 2012-10-22 NOTE — ED Provider Notes (Signed)
Medical screening examination/treatment/procedure(s) were performed by non-physician practitioner and as supervising physician I was immediately available for consultation/collaboration.   MORENO-COLL,Marrion Accomando; MD  Jordi Kamm Moreno-Coll, MD 10/22/12 0951 

## 2013-03-04 ENCOUNTER — Encounter (HOSPITAL_COMMUNITY): Payer: Self-pay | Admitting: Emergency Medicine

## 2013-03-04 DIAGNOSIS — F810 Specific reading disorder: Secondary | ICD-10-CM | POA: Insufficient documentation

## 2013-03-04 DIAGNOSIS — Z888 Allergy status to other drugs, medicaments and biological substances status: Secondary | ICD-10-CM | POA: Insufficient documentation

## 2013-03-04 DIAGNOSIS — Z8613 Personal history of malaria: Secondary | ICD-10-CM | POA: Insufficient documentation

## 2013-03-04 DIAGNOSIS — E669 Obesity, unspecified: Secondary | ICD-10-CM | POA: Insufficient documentation

## 2013-03-04 DIAGNOSIS — Z79899 Other long term (current) drug therapy: Secondary | ICD-10-CM | POA: Insufficient documentation

## 2013-03-04 DIAGNOSIS — L509 Urticaria, unspecified: Secondary | ICD-10-CM | POA: Insufficient documentation

## 2013-03-04 DIAGNOSIS — Z862 Personal history of diseases of the blood and blood-forming organs and certain disorders involving the immune mechanism: Secondary | ICD-10-CM | POA: Insufficient documentation

## 2013-03-04 DIAGNOSIS — Z881 Allergy status to other antibiotic agents status: Secondary | ICD-10-CM | POA: Insufficient documentation

## 2013-03-04 DIAGNOSIS — Z8742 Personal history of other diseases of the female genital tract: Secondary | ICD-10-CM | POA: Insufficient documentation

## 2013-03-04 DIAGNOSIS — R079 Chest pain, unspecified: Secondary | ICD-10-CM | POA: Insufficient documentation

## 2013-03-04 DIAGNOSIS — Z8619 Personal history of other infectious and parasitic diseases: Secondary | ICD-10-CM | POA: Insufficient documentation

## 2013-03-04 NOTE — ED Notes (Signed)
PT. REPORTS MID CHEST PAIN WITH SOB AND GENERALIZED ITCHY RASHES ONSET 2 DAYS AGO , NO NAUSEA OR DIAPHORESIS.

## 2013-03-05 ENCOUNTER — Emergency Department (HOSPITAL_COMMUNITY): Payer: Self-pay

## 2013-03-05 ENCOUNTER — Emergency Department (HOSPITAL_COMMUNITY)
Admission: EM | Admit: 2013-03-05 | Discharge: 2013-03-05 | Disposition: A | Discharge status: Home / Self Care | Payer: Self-pay | Attending: Emergency Medicine | Admitting: Emergency Medicine

## 2013-03-05 DIAGNOSIS — R079 Chest pain, unspecified: Secondary | ICD-10-CM

## 2013-03-05 DIAGNOSIS — L509 Urticaria, unspecified: Secondary | ICD-10-CM

## 2013-03-05 LAB — COMPREHENSIVE METABOLIC PANEL
AST: 24 U/L (ref 0–37)
CO2: 22 mEq/L (ref 19–32)
Calcium: 9.5 mg/dL (ref 8.4–10.5)
Creatinine, Ser: 0.71 mg/dL (ref 0.50–1.10)
GFR calc non Af Amer: >90 mL/min (ref >90)

## 2013-03-05 LAB — POCT I-STAT TROPONIN I: Troponin i, poc: 0 ng/mL (ref 0.00–0.08)

## 2013-03-05 LAB — CBC WITH DIFFERENTIAL/PLATELET
Basophils Absolute: 0 10*3/uL (ref 0.0–0.1)
Basophils Relative: 0 % (ref 0–1)
Eosinophils Relative: 5 % (ref 0–5)
HCT: 40 % (ref 36.0–46.0)
MCHC: 34.3 g/dL (ref 30.0–36.0)
MCV: 85.7 fL (ref 78.0–100.0)
Monocytes Absolute: 0.3 10*3/uL (ref 0.1–1.0)
RDW: 13.3 % (ref 11.5–15.5)

## 2013-03-05 LAB — LIPASE, BLOOD: Lipase: 48 U/L (ref 11–59)

## 2013-03-05 MED ORDER — DIPHENHYDRAMINE HCL 25 MG PO CAPS
25.0000 mg | ORAL_CAPSULE | Freq: Once | ORAL | Status: AC
  Administered 2013-03-05: 25 mg via ORAL
  Filled 2013-03-05: qty 1

## 2013-03-05 MED ORDER — FAMOTIDINE 20 MG PO TABS: 20.0000 mg | ORAL_TABLET | Freq: Two times a day (BID) | ORAL | Status: DC

## 2013-03-05 MED ORDER — FAMOTIDINE 20 MG PO TABS
20.0000 mg | ORAL_TABLET | Freq: Once | ORAL | Status: AC
  Administered 2013-03-05: 20 mg via ORAL
  Filled 2013-03-05: qty 1

## 2013-03-05 MED ORDER — GI COCKTAIL ~~LOC~~
30.0000 mL | Freq: Once | ORAL | Status: AC
  Administered 2013-03-05: 30 mL via ORAL
  Filled 2013-03-05: qty 30

## 2013-03-05 MED ORDER — HYDROCORTISONE 1 % EX CREA
TOPICAL_CREAM | Freq: Two times a day (BID) | CUTANEOUS | Status: DC
  Administered 2013-03-05: 1 via TOPICAL
  Filled 2013-03-05: qty 28

## 2013-03-05 NOTE — ED Provider Notes (Signed)
CSN: [id number]     Arrival date & time 03/04/13  2327 History   First MD Initiated Contact with Patient 03/05/13 0230     Chief Complaint  Patient presents with  . Chest Pain   (Consider location/radiation/quality/duration/timing/severity/associated sxs/prior Treatment) HPI Hx per PT - CP substernal sharp pain, non radiating, on and off, some belching today, onset 2 days ago. No SOB. No known aggravating or alleviating factors. No h/o same. Has a one y/o child - is not breast feeding.  She also noticed itchy rash to her arms that started yesterday - she denies any known exposures, no new medications, no lip/ tongue/ throat swelling. No wheezes.   Past Medical History  Diagnosis Date  . Gestational diabetes     GDM with last pregnancy (2008)  . History of hepatitis B 2007  . History of malaria 2003  . Difficulty reading   . History of gestational diabetes   . Obese   . History of thrombocytopenia   . Abnormal Pap smear     ascus with High Risk for HPV  . De Quervain's tenosynovitis, left    Past Surgical History  Procedure Laterality Date  . Cesarean section  2008    last pregnacy  . Cesarean section  08/21/2011    Procedure: CESAREAN SECTION;  Surgeon: Reva Bores, MD;  Location: [facility];  Service: Gynecology;  Laterality: N/A;   Family History  Problem Relation Age of Onset  . Hypertension Mother    History  Substance Use Topics  . Smoking status: Never Smoker   . Smokeless tobacco: Not on file  . Alcohol Use: No   OB History   Grav Para Term Preterm Abortions TAB SAB Ect Mult Living   5 3 3  0 2  1 0 0 3     Review of Systems  Constitutional: Negative for fever and chills.  HENT: Negative for trouble swallowing, neck pain and neck stiffness.   Eyes: Negative for itching.  Respiratory: Negative for shortness of breath and wheezing.   Cardiovascular: Positive for chest pain.  Gastrointestinal: Negative for vomiting and abdominal pain.  Genitourinary: Negative for  dysuria.  Musculoskeletal: Negative for back pain.  Skin: Positive for rash.  Neurological: Negative for headaches.  All other systems reviewed and are negative.    Allergies  Chloroquine; Cephalexin; and Chloroquine  Home Medications   Current Outpatient Rx  Name  Route  Sig  Dispense  Refill  . acetaminophen (TYLENOL) 325 MG tablet   Oral   Take 650 mg by mouth every 6 (six) hours as needed for pain.          Marland Kitchen ibuprofen (ADVIL,MOTRIN) 800 MG tablet   Oral   Take 1 tablet (800 mg total) by mouth every 8 (eight) hours as needed for pain.   30 tablet   0   . Loratadine 10 MG CAPS   Oral   Take 1 capsule (10 mg total) by mouth daily.   30 each   3   . meclizine (ANTIVERT) 12.5 MG tablet   Oral   Take 1 tablet (12.5 mg total) by mouth 3 (three) times daily as needed for dizziness.   30 tablet   0    BP 121/75  Temp(Src) 98.4 F (36.9 C) (Oral)  Resp 18  SpO2 97% Physical Exam  Nursing note and vitals reviewed. Constitutional: She is oriented to person, place, and time. She appears well-developed and well-nourished.  HENT:  Head: Normocephalic and atraumatic.  Eyes: EOM are normal. Pupils are equal, round, and reactive to light.  Neck: Neck supple.  Cardiovascular: Normal heart sounds and intact distal pulses.   Pulmonary/Chest: Effort normal. No respiratory distress.  Musculoskeletal: Normal range of motion. She exhibits no edema.  Neurological: She is alert and oriented to person, place, and time.  Skin: Skin is warm and dry.  Mild erythematous rash to UEs and torso, is urticarial and macular. Mild excoriations    ED Course  Procedures (including critical care time)   Results for orders placed during the hospital encounter of 03/05/13  CBC WITH DIFFERENTIAL      Result Value Range   WBC 4.3  4.0 - 10.5 K/uL   RBC 4.67  3.87 - 5.11 MIL/uL   Hemoglobin 13.7  12.0 - 15.0 g/dL   HCT 11.9  14.7 - 82.9 %   MCV 85.7  78.0 - 100.0 fL   MCH 29.3  26.0 -  34.0 pg   MCHC 34.3  30.0 - 36.0 g/dL   RDW 56.2  13.0 - 86.5 %   Platelets 125 (*) 150 - 400 K/uL   Neutrophils Relative % 46  43 - 77 %   Neutro Abs 2.0  1.7 - 7.7 K/uL   Lymphocytes Relative 43  12 - 46 %   Lymphs Abs 1.9  0.7 - 4.0 K/uL   Monocytes Relative 6  3 - 12 %   Monocytes Absolute 0.3  0.1 - 1.0 K/uL   Eosinophils Relative 5  0 - 5 %   Eosinophils Absolute 0.2  0.0 - 0.7 K/uL   Basophils Relative 0  0 - 1 %   Basophils Absolute 0.0  0.0 - 0.1 K/uL  COMPREHENSIVE METABOLIC PANEL      Result Value Range   Sodium 138  135 - 145 mEq/L   Potassium 3.8  3.5 - 5.1 mEq/L   Chloride 104  96 - 112 mEq/L   CO2 22  19 - 32 mEq/L   Glucose, Bld 101 (*) 70 - 99 mg/dL   BUN 14  6 - 23 mg/dL   Creatinine, Ser 7.84  0.50 - 1.10 mg/dL   Calcium 9.5  8.4 - 69.6 mg/dL   Total Protein 7.4  6.0 - 8.3 g/dL   Albumin 3.8  3.5 - 5.2 g/dL   AST 24  0 - 37 U/L   ALT 22  0 - 35 U/L   Alkaline Phosphatase 53  39 - 117 U/L   Total Bilirubin 0.5  0.3 - 1.2 mg/dL   GFR calc non Af Amer >90  >90 mL/min   GFR calc Af Amer >90  >90 mL/min  LIPASE, BLOOD      Result Value Range   Lipase 48  11 - 59 U/L  D-DIMER, QUANTITATIVE      Result Value Range   D-Dimer, Quant <0.27  0.00 - 0.48 ug/mL-FEU  TROPONIN I      Result Value Range   Troponin I <0.30  <0.30 ng/mL  POCT I-STAT TROPONIN I      Result Value Range   Troponin i, poc 0.00  0.00 - 0.08 ng/mL   Comment 3            Dg Chest 2 View  03/05/2013   *RADIOLOGY REPORT*  Clinical Data: Chest pain, shortness of breath  CHEST - 2 VIEW  Comparison: None.  Findings: Heart size upper normal.  Mediastinal contours otherwise within normal range.  The lungs are  clear.  No pleural effusion or pneumothorax.  No acute osseous finding.  IMPRESSION: Radiographic evidence of acute cardiopulmonary process.   Original Report Authenticated By: Jearld Lesch, M.D.   ]   Date: 03/05/2013  Rate: 62  Rhythm: normal sinus rhythm  QRS Axis: normal   Intervals: normal  ST/T Wave abnormalities: nonspecific ST changes  Conduction Disutrbances:none  Narrative Interpretation:   Old EKG Reviewed: none available  Medications provided: GI cocktail, Pepcid, Benadryl Topical hydrocortisone provided  - holding oral prednisone for possible gastritis  4:56 AM on recheck is feeling better with only minimal pain.  Presentation suggests symptoms of both GI and musculoskeletal. Plan discharge home with prescriptions provided, outpatient referral, and strict return precautions verbalizes understood.  MDM   Diagnosis chest pain, urticarial rash EKG reviewed as above sinus rate in the 60s Chest x-ray reviewed no pneumothorax or infiltrate Labs reviewed as above negative d-dimer and cardiac enzymes Condition improved with medications provided Vital signs and nursing notes reviewed and considered    Sunnie Nielsen, MD 03/05/13 [phone]

## 2013-03-05 NOTE — ED Notes (Signed)
MD at bedside. 

## 2013-03-05 NOTE — ED Notes (Signed)
Patient transported to X-ray 

## 2013-08-09 ENCOUNTER — Emergency Department (INDEPENDENT_AMBULATORY_CARE_PROVIDER_SITE_OTHER): Payer: Self-pay

## 2013-08-09 ENCOUNTER — Encounter (HOSPITAL_COMMUNITY): Payer: Self-pay | Admitting: Emergency Medicine

## 2013-08-09 ENCOUNTER — Emergency Department (INDEPENDENT_AMBULATORY_CARE_PROVIDER_SITE_OTHER)
Admission: EM | Admit: 2013-08-09 | Discharge: 2013-08-09 | Disposition: A | Discharge status: Home / Self Care | Payer: Self-pay | Source: Home / Self Care | Attending: Family Medicine | Admitting: Family Medicine

## 2013-08-09 DIAGNOSIS — K2970 Gastritis, unspecified, without bleeding: Secondary | ICD-10-CM

## 2013-08-09 DIAGNOSIS — K2990 Gastroduodenitis, unspecified, without bleeding: Secondary | ICD-10-CM

## 2013-08-09 LAB — POCT URINALYSIS DIP (DEVICE)
BILIRUBIN URINE: NEGATIVE
GLUCOSE, UA: NEGATIVE mg/dL
Ketones, ur: NEGATIVE mg/dL
LEUKOCYTES UA: NEGATIVE
NITRITE: NEGATIVE
PH: 6 (ref 5.0–8.0)
Protein, ur: NEGATIVE mg/dL
Specific Gravity, Urine: 1.025 (ref 1.005–1.030)
Urobilinogen, UA: 0.2 mg/dL (ref 0.0–1.0)

## 2013-08-09 LAB — POCT PREGNANCY, URINE: PREG TEST UR: NEGATIVE

## 2013-08-09 MED ORDER — OMEPRAZOLE 40 MG PO CPDR: 40.0000 mg | DELAYED_RELEASE_CAPSULE | Freq: Every day | ORAL | Status: DC

## 2013-08-09 MED ORDER — GI COCKTAIL ~~LOC~~
ORAL | Status: AC
  Filled 2013-08-09: qty 30

## 2013-08-09 MED ORDER — GI COCKTAIL ~~LOC~~
30.0000 mL | Freq: Once | ORAL | Status: AC
  Administered 2013-08-09: 30 mL via ORAL

## 2013-08-09 NOTE — ED Provider Notes (Signed)
Brenda Cannon is a 33 y.o. female who presents to Urgent Care today for left-sided chest pain. This is been present off and on for the last few weeks. It seems to be alleviated with drinking warm milk. She denies any exertional component. The pain is sharp and stabbing and moderate. She denies any central or crushing or exertional pain. She denies any shortness of breath or palpitations. She has not tried any medications yet. She feels well otherwise.   Past Medical History  Diagnosis Date  . Gestational diabetes     GDM with last pregnancy (2008)  . History of hepatitis B 2007  . History of malaria 2003  . Difficulty reading   . History of gestational diabetes   . Obese   . History of thrombocytopenia   . Abnormal Pap smear     ascus with High Risk for HPV  . De Quervain's tenosynovitis, left    History  Substance Use Topics  . Smoking status: Never Smoker   . Smokeless tobacco: Not on file  . Alcohol Use: No   ROS as above Medications: No current facility-administered medications for this encounter.   Current Outpatient Prescriptions  Medication Sig Dispense Refill  . acetaminophen (TYLENOL) 325 MG tablet Take 650 mg by mouth every 6 (six) hours as needed for pain.       . famotidine (PEPCID) 20 MG tablet Take 1 tablet (20 mg total) by mouth 2 (two) times daily.  30 tablet  0  . ibuprofen (ADVIL,MOTRIN) 800 MG tablet Take 1 tablet (800 mg total) by mouth every 8 (eight) hours as needed for pain.  30 tablet  0  . Loratadine 10 MG CAPS Take 1 capsule (10 mg total) by mouth daily.  30 each  3  . meclizine (ANTIVERT) 12.5 MG tablet Take 1 tablet (12.5 mg total) by mouth 3 (three) times daily as needed for dizziness.  30 tablet  0  . omeprazole (PRILOSEC) 40 MG capsule Take 1 capsule (40 mg total) by mouth daily.  30 capsule  3    Exam:  BP 99/54  Pulse 55  Temp(Src) 99 F (37.2 C) (Oral)  Resp 16  SpO2 100%  LMP 06/30/2013 Gen: Well NAD HEENT: EOMI,  MMM Lungs: Normal  work of breathing. CTABL Heart: Bradycardia but regular no MRG Abd: NABS, Soft. NT, ND  Exts: Brisk capillary refill, warm and well perfused.   Patient was given a GI cocktail and had considerable improvement in symptoms  Twelve-lead EKG shows sinus bradycardia at 58 beats per minute. Otherwise normal.  Results for orders placed during the hospital encounter of 08/09/13 (from the past 24 hour(s))  POCT URINALYSIS DIP (DEVICE)     Status: Abnormal   Collection Time    08/09/13 12:03 PM      Result Value Range   Glucose, UA NEGATIVE  NEGATIVE mg/dL   Bilirubin Urine NEGATIVE  NEGATIVE   Ketones, ur NEGATIVE  NEGATIVE mg/dL   Specific Gravity, Urine 1.025  1.005 - 1.030   Hgb urine dipstick TRACE (*) NEGATIVE   pH 6.0  5.0 - 8.0   Protein, ur NEGATIVE  NEGATIVE mg/dL   Urobilinogen, UA 0.2  0.0 - 1.0 mg/dL   Nitrite NEGATIVE  NEGATIVE   Leukocytes, UA NEGATIVE  NEGATIVE  POCT PREGNANCY, URINE     Status: None   Collection Time    08/09/13 12:06 PM      Result Value Range   Preg Test, Ur NEGATIVE  NEGATIVE   Dg Chest 2 View  08/09/2013   CLINICAL DATA:  Chest pain for 3 weeks.  Nonsmoker.  EXAM: CHEST  2 VIEW  COMPARISON:  Radiographs 03/05/2013.  FINDINGS: The heart size is stable at the upper limits of normal. There is improved aeration of the lungs which are now clear. No pleural effusion or pneumothorax is demonstrated. The osseous structures appear unchanged.  IMPRESSION: Interval improved aeration of the lungs. No acute cardiopulmonary process.   Electronically Signed   By: Camie Lauria M.D.   On: 08/09/2013 12:38    Assessment and Plan: 33 y.o. female with probable gastritis. Patient had significant improvement in symptoms with GI cocktail.  EKG and chest x-ray were both normal. Plan to treat with omeprazole.  Discussed warning signs or symptoms. Please see discharge instructions. Patient expresses understanding.    Gregor Hams, MD 08/09/13 [phone]

## 2013-08-09 NOTE — ED Notes (Signed)
C/o 3 week duration of pain in left/mid chest w radiation into left shoulder. Pain comes and goes, none at present. Pain has at times woke from sleep. Denies n/v/sweats associated w pain; no food -pain association, pain sometimes worse w movement

## 2013-08-09 NOTE — Discharge Instructions (Signed)
Thank you for coming in today. Take omeprazole daily Call or go to the emergency room if you get worse, have trouble breathing, have chest pains, or palpitations.  If your belly pain worsens, or you have high fever, bad vomiting, blood in your stool or black tarry stool go to the Emergency Room.   Followup at the [facility] [address] [place], [place] [address] [phone] Please see Bonna Gains to qualify for reduced or free medical services within the [place].  Call her at [phone] today.  Gastritis, Adult Gastritis is soreness and puffiness (inflammation) of the lining of the stomach. If you do not get help, gastritis can cause bleeding and sores (ulcers) in the stomach. HOME CARE   Only take medicine as told by your doctor.  If you were given antibiotic medicines, take them as told. Finish the medicines even if you start to feel better.  Drink enough fluids to keep your pee (urine) clear or pale yellow.  Avoid foods and drinks that make your problems worse. Foods you may want to avoid include:  Caffeine or alcohol.  Chocolate.  Mint.  Garlic and onions.  Spicy foods.  Citrus fruits, including oranges, lemons, or limes.  Food containing tomatoes, including sauce, chili, salsa, and pizza.  Fried and fatty foods.  Eat small meals throughout the day instead of large meals. GET HELP RIGHT AWAY IF:   You have black or dark red poop (stools).  You throw up (vomit) blood. It may look like coffee grounds.  You cannot keep fluids down.  Your belly (abdominal) pain gets worse.  You have a fever.  You do not feel better after 1 week.  You have any other questions or concerns. MAKE SURE YOU:   Understand these instructions.  Will watch your condition.  Will get help right away if you are not doing well or get worse. Document Released: 12/11/2007 Document Revised: 09/16/2011 Document Reviewed:  08/07/2011 [facility] Patient Information 2014 [place].

## 2013-09-09 ENCOUNTER — Emergency Department (HOSPITAL_COMMUNITY)
Admission: EM | Admit: 2013-09-09 | Discharge: 2013-09-09 | Disposition: A | Discharge status: Home / Self Care | Payer: Self-pay | Attending: Emergency Medicine | Admitting: Emergency Medicine

## 2013-09-09 ENCOUNTER — Encounter (HOSPITAL_COMMUNITY): Payer: Self-pay | Admitting: Emergency Medicine

## 2013-09-09 DIAGNOSIS — Z8619 Personal history of other infectious and parasitic diseases: Secondary | ICD-10-CM | POA: Insufficient documentation

## 2013-09-09 DIAGNOSIS — J020 Streptococcal pharyngitis: Secondary | ICD-10-CM | POA: Insufficient documentation

## 2013-09-09 DIAGNOSIS — R599 Enlarged lymph nodes, unspecified: Secondary | ICD-10-CM | POA: Insufficient documentation

## 2013-09-09 LAB — RAPID STREP SCREEN (MED CTR MEBANE ONLY): STREPTOCOCCUS, GROUP A SCREEN (DIRECT): POSITIVE — AB

## 2013-09-09 MED ORDER — PENICILLIN G BENZATHINE 1200000 UNIT/2ML IM SUSP
1.2000 10*6.[IU] | Freq: Once | INTRAMUSCULAR | Status: AC
  Administered 2013-09-09: 1.2 10*6.[IU] via INTRAMUSCULAR
  Filled 2013-09-09: qty 2

## 2013-09-09 MED ORDER — HYDROCODONE-ACETAMINOPHEN 7.5-325 MG/15ML PO SOLN
15.0000 mL | Freq: Once | ORAL | Status: AC
  Administered 2013-09-09: 15 mL via ORAL
  Filled 2013-09-09: qty 15

## 2013-09-09 MED ORDER — HYDROCODONE-ACETAMINOPHEN 7.5-325 MG/15ML PO SOLN: 10.0000 mL | Freq: Four times a day (QID) | ORAL | Status: DC | PRN

## 2013-09-09 MED ORDER — DEXAMETHASONE SODIUM PHOSPHATE 10 MG/ML IJ SOLN
10.0000 mg | Freq: Once | INTRAMUSCULAR | Status: AC
  Administered 2013-09-09: 10 mg via INTRAMUSCULAR
  Filled 2013-09-09: qty 1

## 2013-09-09 NOTE — Discharge Instructions (Signed)
Salt Water Gargle This solution will help make your mouth and throat feel better. HOME CARE INSTRUCTIONS   Mix 1 teaspoon of salt in 8 ounces of warm water.  Gargle with this solution as much or often as you need or as directed. Swish and gargle gently if you have any sores or wounds in your mouth.  Do not swallow this mixture. Document Released: 03/28/2004 Document Revised: 09/16/2011 Document Reviewed: 08/19/2008 [facility] Patient Information 2014 [place]. Strep Throat Strep throat is an infection of the throat caused by a bacteria named Streptococcus pyogenes. Your caregiver may call the infection streptococcal "tonsillitis" or "pharyngitis" depending on whether there are signs of inflammation in the tonsils or back of the throat. Strep throat is most common in children aged 5 15 years during the cold months of the year, but it can occur in people of any age during any season. This infection is spread from person to person (contagious) through coughing, sneezing, or other close contact. SYMPTOMS   Fever or chills.  Painful, swollen, red tonsils or throat.  Pain or difficulty when swallowing.  White or yellow spots on the tonsils or throat.  Swollen, tender lymph nodes or "glands" of the neck or under the jaw.  Red rash all over the body (rare). DIAGNOSIS  Many different infections can cause the same symptoms. A test must be done to confirm the diagnosis so the right treatment can be given. A "rapid strep test" can help your caregiver make the diagnosis in a few minutes. If this test is not available, a light swab of the infected area can be used for a throat culture test. If a throat culture test is done, results are usually available in a day or two. TREATMENT  Strep throat is treated with antibiotic medicine. HOME CARE INSTRUCTIONS   Gargle with 1 tsp of salt in 1 cup of warm water, 3 4 times per day or as needed for comfort.  Family members who also have a sore throat  or fever should be tested for strep throat and treated with antibiotics if they have the strep infection.  Make sure everyone in your household washes their hands well.  Do not share food, drinking cups, or personal items that could cause the infection to spread to others.  You may need to eat a soft food diet until your sore throat gets better.  Drink enough water and fluids to keep your urine clear or pale yellow. This will help prevent dehydration.  Get plenty of rest.  Stay home from school, daycare, or work until you have been on antibiotics for 24 hours.  Only take over-the-counter or prescription medicines for pain, discomfort, or fever as directed by your caregiver.  If antibiotics are prescribed, take them as directed. Finish them even if you start to feel better. SEEK MEDICAL CARE IF:   The glands in your neck continue to enlarge.  You develop a rash, cough, or earache.  You cough up green, yellow-brown, or bloody sputum.  You have pain or discomfort not controlled by medicines.  Your problems seem to be getting worse rather than better. SEEK IMMEDIATE MEDICAL CARE IF:   You develop any new symptoms such as vomiting, severe headache, stiff or painful neck, chest pain, shortness of breath, or trouble swallowing.  You develop severe throat pain, drooling, or changes in your voice.  You develop swelling of the neck, or the skin on the neck becomes red and tender.  You have a fever.  You develop signs of dehydration, such as fatigue, dry mouth, and decreased urination.  You become increasingly sleepy, or you cannot wake up completely. Document Released: 06/21/2000 Document Revised: 06/10/2012 Document Reviewed: 08/23/2010 [facility] Patient Information 2014 [place], [place].

## 2013-09-09 NOTE — ED Provider Notes (Signed)
Medical screening examination/treatment/procedure(s) were performed by non-physician practitioner and as supervising physician I was immediately available for consultation/collaboration.    Teressa Lower, MD 09/09/13 [phone]

## 2013-09-09 NOTE — ED Notes (Signed)
Pt states that she has congestion to her head, a "lump" in her throat and pressure to ears x 3 days; pt able to swallow without difficulty

## 2013-09-09 NOTE — ED Provider Notes (Signed)
CSN: [id number]     Arrival date & time 09/09/13  0053 History   First MD Initiated Contact with Patient 09/09/13 0335     Chief Complaint  Patient presents with  . URI  . Sore Throat     (Consider location/radiation/quality/duration/timing/severity/associated sxs/prior Treatment) Patient is a 33 y.o. female presenting with URI and pharyngitis. The history is provided by the patient. No language interpreter was used.  URI Presenting symptoms: sore throat   Presenting symptoms: no fever   Severity:  Severe Onset quality:  Gradual Duration:  3 days Timing:  Constant Progression:  Worsening Associated symptoms comment:  Sore throat for the past 3 days, beginning on the left only, now is bilateral. No known fever. She is able to swallow but not without significant pain. No sick contacts.  Sore Throat Associated symptoms include a sore throat. Pertinent negatives include no chills, fever, nausea, rash or vomiting.    Past Medical History  Diagnosis Date  . Gestational diabetes     GDM with last pregnancy (2008)  . History of hepatitis B 2007  . History of malaria 2003  . Difficulty reading   . History of gestational diabetes   . Obese   . History of thrombocytopenia   . Abnormal Pap smear     ascus with High Risk for HPV  . De Quervain's tenosynovitis, left    Past Surgical History  Procedure Laterality Date  . Cesarean section  2008    last pregnacy  . Cesarean section  08/21/2011    Procedure: CESAREAN SECTION;  Surgeon: Donnamae Jude, MD;  Location: [facility];  Service: Gynecology;  Laterality: N/A;   Family History  Problem Relation Age of Onset  . Hypertension Mother    History  Substance Use Topics  . Smoking status: Never Smoker   . Smokeless tobacco: Not on file  . Alcohol Use: No   OB History   Grav Para Term Preterm Abortions TAB SAB Ect Mult Living   5 3 3  0 2  1 0 0 3     Review of Systems  Constitutional: Negative for fever and chills.  HENT: Positive  for sore throat.   Respiratory: Negative.   Gastrointestinal: Negative.  Negative for nausea and vomiting.  Musculoskeletal: Negative.   Skin: Negative.  Negative for rash.  Neurological: Negative.       Allergies  Chloroquine; Cephalexin; and Chloroquine  Home Medications   Current Outpatient Rx  Name  Route  Sig  Dispense  Refill  . acetaminophen (TYLENOL) 325 MG tablet   Oral   Take 650 mg by mouth every 6 (six) hours as needed for mild pain, fever or headache.          . ibuprofen (ADVIL,MOTRIN) 200 MG tablet   Oral   Take 400 mg by mouth every 6 (six) hours as needed for moderate pain.         Marland Kitchen omeprazole (PRILOSEC) 40 MG capsule   Oral   Take 1 capsule (40 mg total) by mouth daily.   30 capsule   3    BP 98/60  Pulse 93  Temp(Src) 98.3 F (36.8 C) (Oral)  Resp 18  SpO2 99%  LMP 06/11/2013 Physical Exam  Constitutional: She is oriented to person, place, and time. She appears well-developed and well-nourished.  HENT:  Mouth/Throat: Uvula is midline. Posterior oropharyngeal edema and posterior oropharyngeal erythema present.  Neck: Normal range of motion.  Pulmonary/Chest: Effort normal.  Lymphadenopathy:  She has cervical adenopathy.  Neurological: She is alert and oriented to person, place, and time.  Skin: Skin is warm and dry.    ED Course  Procedures (including critical care time) Labs Review Labs Reviewed  RAPID STREP SCREEN - Abnormal; Notable for the following:    Streptococcus, Group A Screen (Direct) POSITIVE (*)    All other components within normal limits   Imaging Review No results found.   EKG Interpretation None      MDM   Final diagnoses:  None    1. Strep throat  Afebrile patient with a positive strep swab. Able to manage secretions but significantly painful. Decadron given with Bicillin.     Dewaine Oats, PA-C 09/09/13 [phone]

## 2013-10-13 ENCOUNTER — Emergency Department (INDEPENDENT_AMBULATORY_CARE_PROVIDER_SITE_OTHER)
Admission: EM | Admit: 2013-10-13 | Discharge: 2013-10-13 | Disposition: A | Discharge status: Home / Self Care | Payer: Self-pay | Source: Home / Self Care | Attending: Emergency Medicine | Admitting: Emergency Medicine

## 2013-10-13 DIAGNOSIS — M25572 Pain in left ankle and joints of left foot: Secondary | ICD-10-CM

## 2013-10-13 DIAGNOSIS — M25579 Pain in unspecified ankle and joints of unspecified foot: Secondary | ICD-10-CM

## 2013-10-13 MED ORDER — NAPROXEN 375 MG PO TABS: 375.0000 mg | ORAL_TABLET | Freq: Two times a day (BID) | ORAL | Status: DC | PRN

## 2013-10-13 NOTE — Discharge Instructions (Signed)
Please follow up with your primary care doctor if symptoms do not improve with ice, elevation and medication as prescribed.   Arthralgia Your caregiver has diagnosed you as suffering from an arthralgia. Arthralgia means there is pain in a joint. This can come from many reasons including:  Bruising the joint which causes soreness (inflammation) in the joint.  Wear and tear on the joints which occur as we grow older (osteoarthritis).  Overusing the joint.  Various forms of arthritis.  Infections of the joint. Regardless of the cause of pain in your joint, most of these different pains respond to anti-inflammatory drugs and rest. The exception to this is when a joint is infected, and these cases are treated with antibiotics, if it is a bacterial infection. HOME CARE INSTRUCTIONS   Rest the injured area for as long as directed by your caregiver. Then slowly start using the joint as directed by your caregiver and as the pain allows. Crutches as directed may be useful if the ankles, knees or hips are involved. If the knee was splinted or casted, continue use and care as directed. If an stretchy or elastic wrapping bandage has been applied today, it should be removed and re-applied every 3 to 4 hours. It should not be applied tightly, but firmly enough to keep swelling down. Watch toes and feet for swelling, bluish discoloration, coldness, numbness or excessive pain. If any of these problems (symptoms) occur, remove the ace bandage and re-apply more loosely. If these symptoms persist, contact your caregiver or return to this location.  For the first 24 hours, keep the injured extremity elevated on pillows while lying down.  Apply ice for 15-20 minutes to the sore joint every couple hours while awake for the first half day. Then 03-04 times per day for the first 48 hours. Put the ice in a plastic bag and place a towel between the bag of ice and your skin.  Wear any splinting, casting, elastic bandage  applications, or slings as instructed.  Only take over-the-counter or prescription medicines for pain, discomfort, or fever as directed by your caregiver. Do not use aspirin immediately after the injury unless instructed by your physician. Aspirin can cause increased bleeding and bruising of the tissues.  If you were given crutches, continue to use them as instructed and do not resume weight bearing on the sore joint until instructed. Persistent pain and inability to use the sore joint as directed for more than 2 to 3 days are warning signs indicating that you should see a caregiver for a follow-up visit as soon as possible. Initially, a hairline fracture (break in bone) may not be evident on X-rays. Persistent pain and swelling indicate that further evaluation, non-weight bearing or use of the joint (use of crutches or slings as instructed), or further X-rays are indicated. X-rays may sometimes not show a small fracture until a week or 10 days later. Make a follow-up appointment with your own caregiver or one to whom we have referred you. A radiologist (specialist in reading X-rays) may read your X-rays. Make sure you know how you are to obtain your X-ray results. Do not assume everything is normal if you do not hear from [place]. SEEK MEDICAL CARE IF: Bruising, swelling, or pain increases. SEEK IMMEDIATE MEDICAL CARE IF:   Your fingers or toes are numb or blue.  The pain is not responding to medications and continues to stay the same or get worse.  The pain in your joint becomes severe.  You  develop a fever over 102 F (38.9 C).  It becomes impossible to move or use the joint. MAKE SURE YOU:   Understand these instructions.  Will watch your condition.  Will get help right away if you are not doing well or get worse. Document Released: 06/24/2005 Document Revised: 09/16/2011 Document Reviewed: 02/10/2008 [facility] Patient Information 2014 [employer].

## 2013-10-13 NOTE — ED Notes (Signed)
Foot pain   X  1  Week  l  Foot  With  Pain  Radiating  Up  The  Leg      Pt    denys  Any      specefic  Injury     She  Reports  Pain  Increases        After  Walking       And  She has  Some  Swelling in the  Affected   Limb as  Well  At the  End  Of the  Day

## 2013-10-13 NOTE — ED Provider Notes (Signed)
CSN: [id number]     Arrival date & time 10/13/13  0909 History   First MD Initiated Contact with Patient 10/13/13 [phone]     Chief Complaint  Patient presents with  . Foot Pain   (Consider location/radiation/quality/duration/timing/severity/associated sxs/prior Treatment) HPI Comments: Works at [employer]. Wears steel toed boots during the day.  Denies recent injury, previous injury or surgery. No hx of gout. No fever or joint swelling.  PCP: [place] Reports little to no pain at time of today's visit.   Patient is a 33 y.o. female presenting with lower extremity pain. The history is provided by the patient.  Foot Pain This is a new problem. The current episode started more than 1 week ago. Episode frequency: Usually occurs in evenings if she had had to stand for prolinged period of time at work during the day.  The problem has not changed since onset.   Past Medical History  Diagnosis Date  . Gestational diabetes     GDM with last pregnancy (2008)  . History of hepatitis B 2007  . History of malaria 2003  . Difficulty reading   . History of gestational diabetes   . Obese   . History of thrombocytopenia   . Abnormal Pap smear     ascus with High Risk for HPV  . De Quervain's tenosynovitis, left    Past Surgical History  Procedure Laterality Date  . Cesarean section  2008    last pregnacy  . Cesarean section  08/21/2011    Procedure: CESAREAN SECTION;  Surgeon: Donnamae Jude, MD;  Location: [facility];  Service: Gynecology;  Laterality: N/A;   Family History  Problem Relation Age of Onset  . Hypertension Mother    History  Substance Use Topics  . Smoking status: Never Smoker   . Smokeless tobacco: Not on file  . Alcohol Use: No   OB History   Grav Para Term Preterm Abortions TAB SAB Ect Mult Living   5 3 3  0 2  1 0 0 3     Review of Systems  All other systems reviewed and are negative.   Allergies  Chloroquine; Cephalexin; and Chloroquine  Home  Medications   Current Outpatient Rx  Name  Route  Sig  Dispense  Refill  . acetaminophen (TYLENOL) 325 MG tablet   Oral   Take 650 mg by mouth every 6 (six) hours as needed for mild pain, fever or headache.          Marland Kitchen HYDROcodone-acetaminophen (HYCET) 7.5-325 mg/15 ml solution   Oral   Take 10 mLs by mouth 4 (four) times daily as needed for moderate pain.   120 mL   0   . ibuprofen (ADVIL,MOTRIN) 200 MG tablet   Oral   Take 400 mg by mouth every 6 (six) hours as needed for moderate pain.         . naproxen (NAPROSYN) 375 MG tablet   Oral   Take 1 tablet (375 mg total) by mouth 2 (two) times daily as needed for mild pain or moderate pain.   20 tablet   0   . omeprazole (PRILOSEC) 40 MG capsule   Oral   Take 1 capsule (40 mg total) by mouth daily.   30 capsule   3    BP 142/84  Pulse 72  Temp(Src) 98.6 F (37 C) (Oral)  Resp 18  SpO2 100% Physical Exam  Nursing note and vitals reviewed. Constitutional: She is oriented  to person, place, and time. She appears well-developed and well-nourished. No distress.  HENT:  Head: Normocephalic and atraumatic.  Eyes: Conjunctivae are normal. No scleral icterus.  Cardiovascular: Normal rate.   Pulmonary/Chest: Effort normal.  Musculoskeletal:       Left foot: Normal.       Feet:  Outline area is area or reported discomfort when symptoms occur. CSM exam of left foot intact  Neurological: She is alert and oriented to person, place, and time.  Skin: Skin is warm and dry. No rash noted. No erythema.  Psychiatric: She has a normal mood and affect. Her behavior is normal.    ED Course  Procedures (including critical care time) Labs Review Labs Reviewed - No data to display Imaging Review No results found.   MDM   1. Arthralgia of left foot    Will advise NSAID as prescribed along with ice and elevation on evening that foot is bothersome. PCP follow up if no improvement.   [place], [place] 10/13/13 1006

## 2013-10-14 NOTE — ED Provider Notes (Signed)
Medical screening examination/treatment/procedure(s) were performed by non-physician practitioner and as supervising physician I was immediately available for consultation/collaboration.  Philipp Deputy, M.D.  Harden Mo, MD 10/14/13 [phone]

## 2013-10-25 ENCOUNTER — Emergency Department (INDEPENDENT_AMBULATORY_CARE_PROVIDER_SITE_OTHER): Payer: No Typology Code available for payment source

## 2013-10-25 ENCOUNTER — Emergency Department (HOSPITAL_COMMUNITY)
Admission: EM | Admit: 2013-10-25 | Discharge: 2013-10-25 | Disposition: A | Discharge status: Home / Self Care | Payer: No Typology Code available for payment source | Source: Home / Self Care | Attending: Family Medicine | Admitting: Family Medicine

## 2013-10-25 ENCOUNTER — Encounter (HOSPITAL_COMMUNITY): Payer: Self-pay | Admitting: Emergency Medicine

## 2013-10-25 DIAGNOSIS — M84376A Stress fracture, unspecified foot, initial encounter for fracture: Secondary | ICD-10-CM

## 2013-10-25 DIAGNOSIS — M84375A Stress fracture, left foot, initial encounter for fracture: Secondary | ICD-10-CM

## 2013-10-25 MED ORDER — TRAMADOL HCL 50 MG PO TABS: 50.0000 mg | ORAL_TABLET | Freq: Four times a day (QID) | ORAL | Status: DC | PRN

## 2013-10-25 NOTE — ED Provider Notes (Signed)
Brenda Cannon is a 33 y.o. female who presents to Urgent Care today for left foot pain. Patient has had 3 weeks of left mid foot pain and swelling. She denies any injury. The pain is worse with activity. She works as a Architectural technologist at work. She's tried NSAIDs which have not been helpful. She denies any fevers or chills or radiating pain weakness or numbness. She denies any nausea vomiting or diarrhea. She was seen April 8 for the same issue and has not improved.   Past Medical History  Diagnosis Date  . Gestational diabetes     GDM with last pregnancy (2008)  . History of hepatitis B 2007  . History of malaria 2003  . Difficulty reading   . History of gestational diabetes   . Obese   . History of thrombocytopenia   . Abnormal Pap smear     ascus with High Risk for HPV  . De Quervain's tenosynovitis, left    History  Substance Use Topics  . Smoking status: Never Smoker   . Smokeless tobacco: Not on file  . Alcohol Use: No   ROS as above Medications: No current facility-administered medications for this encounter.   Current Outpatient Prescriptions  Medication Sig Dispense Refill  . acetaminophen (TYLENOL) 325 MG tablet Take 650 mg by mouth every 6 (six) hours as needed for mild pain, fever or headache.       Marland Kitchen HYDROcodone-acetaminophen (HYCET) 7.5-325 mg/15 ml solution Take 10 mLs by mouth 4 (four) times daily as needed for moderate pain.  120 mL  0  . ibuprofen (ADVIL,MOTRIN) 200 MG tablet Take 400 mg by mouth every 6 (six) hours as needed for moderate pain.      . naproxen (NAPROSYN) 375 MG tablet Take 1 tablet (375 mg total) by mouth 2 (two) times daily as needed for mild pain or moderate pain.  20 tablet  0  . omeprazole (PRILOSEC) 40 MG capsule Take 1 capsule (40 mg total) by mouth daily.  30 capsule  3    Exam:  BP 100/70  Pulse 76  Temp(Src) 98.6 F (37 C) (Oral)  SpO2 100% Gen: Well NAD Left foot: Swollen and tender over the metatarsal  shafts 2-4. capillary refill sensation motion are intact. Pulses are intact  Limited musculoskeletal ultrasound of the left foot: The third metatarsal shaft has a cortical defect with overlying hypoechoic fluid. This is characteristic of a stress fracture.  No effusions of the joints of the metatarsal tarsal joints or MTPs.    No results found for this or any previous visit (from the past 24 hour(s)). Dg Foot Complete Left  10/25/2013   CLINICAL DATA:  Metatarsal pain  EXAM: LEFT FOOT - COMPLETE 3+ VIEW  COMPARISON:  None.  FINDINGS: There is no evidence of fracture or dislocation. There is no evidence of arthropathy or other focal bone abnormality. Soft tissues are unremarkable.  IMPRESSION: Negative.   Electronically Signed   By: Kathreen Devoid   On: 10/25/2013 09:18    Assessment and Plan: 33 y.o. female with left foot third metatarsal shaft stress fracture. Not apparent on x-ray.  Plan to use postoperative shoe and relative rest. Work note provided. Follow up with sports medicine in about 2 weeks for monitoring.  Discussed warning signs or symptoms. Please see discharge instructions. Patient expresses understanding.    Gregor Hams, MD 10/25/13 1026

## 2013-10-25 NOTE — ED Notes (Signed)
Left foot swelling that started approx 3 weeks, itching around ankle.  Pedal pulses 2 plus.  No known injurypoints to top of foot as location of pain

## 2013-10-25 NOTE — Discharge Instructions (Signed)
Thank you for coming in today. Use the rigid soled shoe for 6 weeks. Followup at [facility] in 2-3 weeks. Please take calcium and vitamin D.  Take 600mg  calcium twice daily and 2000 units of vitamin d daily.  Use tramadol for pain. Do not take at work or while driving.    Metatarsal Stress Fracture A stress fracture is a break in a bone of the body that is caused by repeated stress (trauma) that slowly weakens the bone until it eventually breaks. The metatarsal bones are in the middle of the feet, connecting the toes to the ankle. They are vulnerable to stress fractures. Metatarsal stress fractures are the second most common type of stress fracture in athletes. The metatarsal of the pointer toe (second metatarsal) is the most common metatarsal to suffer a stress fracture. SYMPTOMS   Vague, spread out pain or ache. Sometimes, tenderness and swelling in the foot.  Uncommonly, bleeding and bruising in the foot.  Weakness and inability to bear weight on the injured foot.  Paleness and deformity (sometimes). CAUSES  A stress fracture is caused by repeated trauma. This slowly weakens the bone, faster than it can heal itself, until the bone breaks. Stress fractures often follow a sudden change in training schedule. Stress fractures may be related to the loss of menstrual period in women.  RISK INCREASES WITH:   Previous stress fracture.  Sudden changes in training intensity, frequency, or duration Geophysical data processor recruits, distance runners).  Bony abnormalities (osteoporosis, tumors).  Metabolism disorders or hormone problems.  Nutrition deficiencies or eating disorders (anorexia or bulimia).  The loss of or irregular menstrual periods in women.  Poor strength and flexibility.  Running on hard surfaces. Poor leg and foot alignment. This includes flat feet.  Poor footwear with poor shock absorbers.  Poor running technique. PREVENTION   Warm up and stretch properly  before activity.  Maintain physical fitness:  Muscle strength.  Endurance and flexibility.  Wear proper and correctly fitted footwear. Replace shoes after 300 to 500 miles of running.  Learn and use proper technique with training and activity.  Increase activity and training gradually.  Treat hormonal disorders. Birth control pills can be helpful for women with menstrual period irregularity.  Correct metabolism and nutrition disorders.  Wear cushioned arch supports for runners with flat feet. PROGNOSIS  With proper treatment, stress fractures usually heal within 6 to 12 weeks. RELATED COMPLICATIONS   Failure to heal (nonunion), especially with stress fractures of the outer foot (upper part of the fifth metatarsal).  Healing in a poor position (malunion).  Recurring stress fracture.  Progression to a complete or displaced fracture.  Risks of surgery: infection, bleeding, injury to nerves (numbness, weakness, paralysis), and need for further surgery.  Repeated stress fracture, not necessarily at the same site. (Occurs in 1 of every 10 patients). TREATMENT Treatment first involves ice and medicine to reduce pain and inflammation. You must rest from any aggravating activity, to avoid making the fracture worse. For severe stress fractures, crutches may be advised, to take weight off the injured foot. Depending on your caregiver's instructions, you may be permitted to perform activities that do not cause pain. Any menstrual, hormonal, or nutritional problems must be addressed and treated. Return to activity must be performed gradually, to avoid reinjuring the foot. Physical therapy may be advised, to help strengthen the foot and regain full function. On rare occasions, surgery is needed. This may be offered if non-surgical treatment is ineffective after 3  to 6 months. MEDICATION   If pain medicine is needed, nonsteroidal anti-inflammatory medicines (NSAIDS) or other minor pain  relievers are often advised.  Do not take pain medicine for 7 days before surgery.  Only take over-the-counter or prescription medicines for pain, discomfort, or fever as directed by your caregiver. SEEK IMMEDIATE MEDICAL CARE IF:  Symptoms get worse or do not improve in 2 weeks, despite treatment. The following occur after immobilization or surgery:  Swelling above or below the fracture site.  Severe, persistent pain.  Blue or gray skin below the fracture site, especially under the toenails. Numbness or loss of feeling below the fracture site.  New, unexplained symptoms develop. (Drugs used in treatment may produce side effects.) Document Released: 06/24/2005 Document Revised: 09/16/2011 Document Reviewed: 10/06/2008 [facility] Patient Information 2014 [place], [place].

## 2013-10-26 ENCOUNTER — Encounter (HOSPITAL_COMMUNITY): Payer: Self-pay | Admitting: Emergency Medicine

## 2013-10-26 ENCOUNTER — Emergency Department (HOSPITAL_COMMUNITY)
Admission: EM | Admit: 2013-10-26 | Discharge: 2013-10-26 | Disposition: A | Discharge status: Home / Self Care | Payer: No Typology Code available for payment source | Attending: Emergency Medicine | Admitting: Emergency Medicine

## 2013-10-26 DIAGNOSIS — Z8619 Personal history of other infectious and parasitic diseases: Secondary | ICD-10-CM | POA: Insufficient documentation

## 2013-10-26 DIAGNOSIS — R319 Hematuria, unspecified: Secondary | ICD-10-CM | POA: Insufficient documentation

## 2013-10-26 DIAGNOSIS — Z862 Personal history of diseases of the blood and blood-forming organs and certain disorders involving the immune mechanism: Secondary | ICD-10-CM | POA: Insufficient documentation

## 2013-10-26 DIAGNOSIS — Z3202 Encounter for pregnancy test, result negative: Secondary | ICD-10-CM | POA: Insufficient documentation

## 2013-10-26 DIAGNOSIS — E669 Obesity, unspecified: Secondary | ICD-10-CM | POA: Insufficient documentation

## 2013-10-26 DIAGNOSIS — M25473 Effusion, unspecified ankle: Secondary | ICD-10-CM | POA: Insufficient documentation

## 2013-10-26 DIAGNOSIS — N390 Urinary tract infection, site not specified: Secondary | ICD-10-CM | POA: Insufficient documentation

## 2013-10-26 DIAGNOSIS — Z8632 Personal history of gestational diabetes: Secondary | ICD-10-CM | POA: Insufficient documentation

## 2013-10-26 DIAGNOSIS — M25579 Pain in unspecified ankle and joints of unspecified foot: Secondary | ICD-10-CM | POA: Insufficient documentation

## 2013-10-26 DIAGNOSIS — Z8613 Personal history of malaria: Secondary | ICD-10-CM | POA: Insufficient documentation

## 2013-10-26 DIAGNOSIS — M79672 Pain in left foot: Secondary | ICD-10-CM

## 2013-10-26 DIAGNOSIS — M25476 Effusion, unspecified foot: Secondary | ICD-10-CM | POA: Insufficient documentation

## 2013-10-26 LAB — URINALYSIS, ROUTINE W REFLEX MICROSCOPIC
Bilirubin Urine: NEGATIVE
GLUCOSE, UA: NEGATIVE mg/dL
KETONES UR: NEGATIVE mg/dL
LEUKOCYTES UA: NEGATIVE
Nitrite: NEGATIVE
PROTEIN: 30 mg/dL — AB
Specific Gravity, Urine: 1.027 (ref 1.005–1.030)
Urobilinogen, UA: 0.2 mg/dL (ref 0.0–1.0)
pH: 5.5 (ref 5.0–8.0)

## 2013-10-26 LAB — WET PREP, GENITAL
Clue Cells Wet Prep HPF POC: NONE SEEN
Trich, Wet Prep: NONE SEEN
YEAST WET PREP: NONE SEEN

## 2013-10-26 LAB — URINE MICROSCOPIC-ADD ON

## 2013-10-26 LAB — I-STAT CHEM 8, ED
BUN: 11 mg/dL (ref 6–23)
CHLORIDE: 104 meq/L (ref 96–112)
CREATININE: 0.7 mg/dL (ref 0.50–1.10)
Calcium, Ion: 1.25 mmol/L — ABNORMAL HIGH (ref 1.12–1.23)
Glucose, Bld: 108 mg/dL — ABNORMAL HIGH (ref 70–99)
HCT: 41 % (ref 36.0–46.0)
Hemoglobin: 13.9 g/dL (ref 12.0–15.0)
Potassium: 3.7 mEq/L (ref 3.7–5.3)
SODIUM: 143 meq/L (ref 137–147)
TCO2: 25 mmol/L (ref 0–100)

## 2013-10-26 LAB — CBC WITH DIFFERENTIAL/PLATELET
Basophils Absolute: 0 10*3/uL (ref 0.0–0.1)
Basophils Relative: 0 % (ref 0–1)
EOS PCT: 1 % (ref 0–5)
Eosinophils Absolute: 0 10*3/uL (ref 0.0–0.7)
HEMATOCRIT: 39.2 % (ref 36.0–46.0)
Hemoglobin: 13.3 g/dL (ref 12.0–15.0)
LYMPHS PCT: 35 % (ref 12–46)
Lymphs Abs: 1.2 10*3/uL (ref 0.7–4.0)
MCH: 29.4 pg (ref 26.0–34.0)
MCHC: 33.9 g/dL (ref 30.0–36.0)
MCV: 86.5 fL (ref 78.0–100.0)
MONO ABS: 0.3 10*3/uL (ref 0.1–1.0)
MONOS PCT: 8 % (ref 3–12)
Neutro Abs: 1.9 10*3/uL (ref 1.7–7.7)
Neutrophils Relative %: 56 % (ref 43–77)
Platelets: 120 10*3/uL — ABNORMAL LOW (ref 150–400)
RBC: 4.53 MIL/uL (ref 3.87–5.11)
RDW: 13.5 % (ref 11.5–15.5)
WBC: 3.3 10*3/uL — ABNORMAL LOW (ref 4.0–10.5)

## 2013-10-26 LAB — RPR

## 2013-10-26 LAB — POC URINE PREG, ED: Preg Test, Ur: NEGATIVE

## 2013-10-26 MED ORDER — MELOXICAM 15 MG PO TABS
15.0000 mg | ORAL_TABLET | Freq: Every day | ORAL | Status: DC
  Administered 2013-10-26: 15 mg via ORAL
  Filled 2013-10-26 (×2): qty 1

## 2013-10-26 MED ORDER — HYDROCODONE-ACETAMINOPHEN 5-325 MG PO TABS
1.0000 | ORAL_TABLET | Freq: Once | ORAL | Status: AC
  Administered 2013-10-26: 1 via ORAL
  Filled 2013-10-26: qty 1

## 2013-10-26 MED ORDER — TRAMADOL HCL 50 MG PO TABS: 50.0000 mg | ORAL_TABLET | Freq: Four times a day (QID) | ORAL | Status: DC | PRN

## 2013-10-26 MED ORDER — SULFAMETHOXAZOLE-TRIMETHOPRIM 800-160 MG PO TABS: 1.0000 | ORAL_TABLET | Freq: Two times a day (BID) | ORAL | Status: DC

## 2013-10-26 NOTE — ED Notes (Signed)
Pt presents with R flank pain.  Pt reports it looks like there is blood in her urine.  Pt also c/o L foot pain.  Pt states she was seen at [facility] yesterday for her foot.

## 2013-10-26 NOTE — ED Notes (Signed)
MD at the bedside performing Pelvic Exam.

## 2013-10-26 NOTE — Discharge Instructions (Signed)
Urinary Tract Infection  Urinary tract infections (UTIs) can develop anywhere along your urinary tract. Your urinary tract is your body's drainage system for removing wastes and extra water. Your urinary tract includes two kidneys, two ureters, a bladder, and a urethra. Your kidneys are a pair of bean-shaped organs. Each kidney is about the size of your fist. They are located below your ribs, one on each side of your spine.  CAUSES  Infections are caused by microbes, which are microscopic organisms, including fungi, viruses, and bacteria. These organisms are so small that they can only be seen through a microscope. Bacteria are the microbes that most commonly cause UTIs.  SYMPTOMS   Symptoms of UTIs may vary by age and gender of the patient and by the location of the infection. Symptoms in young women typically include a frequent and intense urge to urinate and a painful, burning feeling in the bladder or urethra during urination. Older women and men are more likely to be tired, shaky, and weak and have muscle aches and abdominal pain. A fever may mean the infection is in your kidneys. Other symptoms of a kidney infection include pain in your back or sides below the ribs, nausea, and vomiting.  DIAGNOSIS  To diagnose a UTI, your caregiver will ask you about your symptoms. Your caregiver also will ask to provide a urine sample. The urine sample will be tested for bacteria and white blood cells. White blood cells are made by your body to help fight infection.  TREATMENT   Typically, UTIs can be treated with medication. Because most UTIs are caused by a bacterial infection, they usually can be treated with the use of antibiotics. The choice of antibiotic and length of treatment depend on your symptoms and the type of bacteria causing your infection.  HOME CARE INSTRUCTIONS   If you were prescribed antibiotics, take them exactly as your caregiver instructs you. Finish the medication even if you feel better after you  have only taken some of the medication.   Drink enough water and fluids to keep your urine clear or pale yellow.   Avoid caffeine, tea, and carbonated beverages. They tend to irritate your bladder.   Empty your bladder often. Avoid holding urine for long periods of time.   Empty your bladder before and after sexual intercourse.   After a bowel movement, women should cleanse from front to back. Use each tissue only once.  SEEK MEDICAL CARE IF:    You have back pain.   You develop a fever.   Your symptoms do not begin to resolve within 3 days.  SEEK IMMEDIATE MEDICAL CARE IF:    You have severe back pain or lower abdominal pain.   You develop chills.   You have nausea or vomiting.   You have continued burning or discomfort with urination.  MAKE SURE YOU:    Understand these instructions.   Will watch your condition.   Will get help right away if you are not doing well or get worse.  Document Released: 04/03/2005 Document Revised: 12/24/2011 Document Reviewed: 08/02/2011  [employer] Patient Information 2014 [employer]

## 2013-10-26 NOTE — ED Provider Notes (Signed)
CSN: [id number]     Arrival date & time 10/26/13  1655 History   First MD Initiated Contact with Patient 10/26/13 1724     Chief Complaint  Patient presents with  . Flank Pain  . Foot Pain     (Consider location/radiation/quality/duration/timing/severity/associated sxs/prior Treatment) HPI Comments: Patient is 33 year old female who was seen yesterday at [facility] with complaints of left foot pain - she states that it continues to hurt and that she has noted swelling, according to the notes there she has a left third midshaft stress fracture which the patient does not know about.  She states she was here in the hospital today and visiting a friend when she began to have right lower abdominal pain and feel like there was "burning in my privates".  She reports dysuria and hematuria but denies vaginal discharge or bleeding.  She also denies nausea, vomiting or diarrhea.    Patient is a 33 y.o. female presenting with flank pain and lower extremity pain. The history is provided by the patient. No language interpreter was used.  Flank Pain This is a new problem. The current episode started today. The problem occurs constantly. The problem has been unchanged. Associated symptoms include abdominal pain, joint swelling and urinary symptoms. Pertinent negatives include no anorexia, chest pain, chills, coughing, diaphoresis, fatigue, fever, myalgias, nausea, numbness, sore throat, vomiting or weakness.  Foot Pain Associated symptoms include abdominal pain, joint swelling and urinary symptoms. Pertinent negatives include no anorexia, chest pain, chills, coughing, diaphoresis, fatigue, fever, myalgias, nausea, numbness, sore throat, vomiting or weakness.    Past Medical History  Diagnosis Date  . Gestational diabetes     GDM with last pregnancy (2008)  . History of hepatitis B 2007  . History of malaria 2003  . Difficulty reading   . History of gestational diabetes   . Obese   . History of thrombocytopenia    . Abnormal Pap smear     ascus with High Risk for HPV  . De Quervain's tenosynovitis, left    Past Surgical History  Procedure Laterality Date  . Cesarean section  2008    last pregnacy  . Cesarean section  08/21/2011    Procedure: CESAREAN SECTION;  Surgeon: Donnamae Jude, MD;  Location: [facility];  Service: Gynecology;  Laterality: N/A;   Family History  Problem Relation Age of Onset  . Hypertension Mother    History  Substance Use Topics  . Smoking status: Never Smoker   . Smokeless tobacco: Not on file  . Alcohol Use: No   OB History   Grav Para Term Preterm Abortions TAB SAB Ect Mult Living   5 3 3  0 2  1 0 0 3     Review of Systems  Constitutional: Negative for fever, chills, diaphoresis and fatigue.  HENT: Negative for sore throat.   Respiratory: Negative for cough.   Cardiovascular: Negative for chest pain.  Gastrointestinal: Positive for abdominal pain. Negative for nausea, vomiting and anorexia.  Genitourinary: Positive for flank pain.  Musculoskeletal: Positive for joint swelling. Negative for myalgias.  Neurological: Negative for weakness and numbness.  All other systems reviewed and are negative.     Allergies  Chloroquine and Cephalexin  Home Medications   Prior to Admission medications   Medication Sig Start Date End Date Taking? Authorizing Provider  acetaminophen (TYLENOL) 325 MG tablet Take 650 mg by mouth every 6 (six) hours as needed for mild pain, fever or headache.    Yes  Historical Provider, MD  ibuprofen (ADVIL,MOTRIN) 200 MG tablet Take 400 mg by mouth every 6 (six) hours as needed for moderate pain.   Yes Historical Provider, MD  traMADol (ULTRAM) 50 MG tablet Take 1 tablet (50 mg total) by mouth every 6 (six) hours as needed. 10/25/13   Gregor Hams, MD   BP 109/58  Pulse 68  Temp(Src) 98.5 F (36.9 C) (Oral)  Resp 18  Wt 190 lb (86.183 kg)  SpO2 98%  LMP 06/27/2013 Physical Exam  Nursing note and vitals reviewed. Constitutional:  She is oriented to person, place, and time. She appears well-developed and well-nourished. No distress.  HENT:  Head: Normocephalic and atraumatic.  Right Ear: External ear normal.  Left Ear: External ear normal.  Nose: Nose normal.  Mouth/Throat: Oropharynx is clear and moist. No oropharyngeal exudate.  Eyes: Conjunctivae are normal. Pupils are equal, round, and reactive to light. No scleral icterus.  Neck: Normal range of motion. Neck supple.  Cardiovascular: Normal rate, regular rhythm and normal heart sounds.  Exam reveals no gallop and no friction rub.   No murmur heard. Pulmonary/Chest: Effort normal and breath sounds normal. No respiratory distress. She has no wheezes. She has no rales. She exhibits no tenderness.  Abdominal: Soft. Bowel sounds are normal. She exhibits no distension and no mass. There is tenderness. There is no rebound and no guarding.    obese  Genitourinary: There is no rash, tenderness or lesion on the right labia. There is no rash, tenderness or lesion on the left labia. Uterus is not enlarged and not tender. Cervix exhibits no motion tenderness and no discharge. Right adnexum displays no mass, no tenderness and no fullness. Left adnexum displays no mass, no tenderness and no fullness. No tenderness or bleeding around the vagina. No foreign body around the vagina. No vaginal discharge found.  Lymphadenopathy:    She has no cervical adenopathy.  Neurological: She is alert and oriented to person, place, and time. She exhibits normal muscle tone. Coordination normal.  Skin: Skin is warm and dry. No rash noted. No erythema. No pallor.  Psychiatric: She has a normal mood and affect. Her behavior is normal. Judgment and thought content normal.    ED Course  Procedures (including critical care time) Labs Review Labs Reviewed  URINALYSIS, ROUTINE W REFLEX MICROSCOPIC - Abnormal; Notable for the following:    Color, Urine AMBER (*)    APPearance CLOUDY (*)    Hgb  urine dipstick LARGE (*)    Protein, ur 30 (*)    All other components within normal limits  URINE MICROSCOPIC-ADD ON - Abnormal; Notable for the following:    Squamous Epithelial / LPF FEW (*)    All other components within normal limits  CBC WITH DIFFERENTIAL - Abnormal; Notable for the following:    WBC 3.3 (*)    Platelets 120 (*)    All other components within normal limits  I-STAT CHEM 8, ED - Abnormal; Notable for the following:    Glucose, Bld 108 (*)    Calcium, Ion 1.25 (*)    All other components within normal limits  GC/CHLAMYDIA PROBE AMP  WET PREP, GENITAL  RPR  HIV ANTIBODY (ROUTINE TESTING)  POC URINE PREG, ED    Imaging Review Dg Foot Complete Left  10/25/2013   CLINICAL DATA:  Metatarsal pain  EXAM: LEFT FOOT - COMPLETE 3+ VIEW  COMPARISON:  None.  FINDINGS: There is no evidence of fracture or dislocation. There is no evidence  of arthropathy or other focal bone abnormality. Soft tissues are unremarkable.  IMPRESSION: Negative.   Electronically Signed   By: Kathreen Devoid   On: 10/25/2013 09:18     EKG Interpretation None      Results for orders placed during the hospital encounter of 10/26/13  WET PREP, GENITAL      Result Value Ref Range   Yeast Wet Prep HPF POC NONE SEEN  NONE SEEN   Trich, Wet Prep NONE SEEN  NONE SEEN   Clue Cells Wet Prep HPF POC NONE SEEN  NONE SEEN   WBC, Wet Prep HPF POC FEW (*) NONE SEEN  URINALYSIS, ROUTINE W REFLEX MICROSCOPIC      Result Value Ref Range   Color, Urine AMBER (*) YELLOW   APPearance CLOUDY (*) CLEAR   Specific Gravity, Urine 1.027  1.005 - 1.030   pH 5.5  5.0 - 8.0   Glucose, UA NEGATIVE  NEGATIVE mg/dL   Hgb urine dipstick LARGE (*) NEGATIVE   Bilirubin Urine NEGATIVE  NEGATIVE   Ketones, ur NEGATIVE  NEGATIVE mg/dL   Protein, ur 30 (*) NEGATIVE mg/dL   Urobilinogen, UA 0.2  0.0 - 1.0 mg/dL   Nitrite NEGATIVE  NEGATIVE   Leukocytes, UA NEGATIVE  NEGATIVE  URINE MICROSCOPIC-ADD ON      Result Value Ref  Range   Squamous Epithelial / LPF FEW (*) RARE   WBC, UA 0-2  <3 WBC/hpf   RBC / HPF TOO NUMEROUS TO COUNT  <3 RBC/hpf   Bacteria, UA RARE  RARE  CBC WITH DIFFERENTIAL      Result Value Ref Range   WBC 3.3 (*) 4.0 - 10.5 K/uL   RBC 4.53  3.87 - 5.11 MIL/uL   Hemoglobin 13.3  12.0 - 15.0 g/dL   HCT 39.2  36.0 - 46.0 %   MCV 86.5  78.0 - 100.0 fL   MCH 29.4  26.0 - 34.0 pg   MCHC 33.9  30.0 - 36.0 g/dL   RDW 13.5  11.5 - 15.5 %   Platelets 120 (*) 150 - 400 K/uL   Neutrophils Relative % 56  43 - 77 %   Neutro Abs 1.9  1.7 - 7.7 K/uL   Lymphocytes Relative 35  12 - 46 %   Lymphs Abs 1.2  0.7 - 4.0 K/uL   Monocytes Relative 8  3 - 12 %   Monocytes Absolute 0.3  0.1 - 1.0 K/uL   Eosinophils Relative 1  0 - 5 %   Eosinophils Absolute 0.0  0.0 - 0.7 K/uL   Basophils Relative 0  0 - 1 %   Basophils Absolute 0.0  0.0 - 0.1 K/uL  POC URINE PREG, ED      Result Value Ref Range   Preg Test, Ur NEGATIVE  NEGATIVE  I-STAT CHEM 8, ED      Result Value Ref Range   Sodium 143  137 - 147 mEq/L   Potassium 3.7  3.7 - 5.3 mEq/L   Chloride 104  96 - 112 mEq/L   BUN 11  6 - 23 mg/dL   Creatinine, Ser 0.70  0.50 - 1.10 mg/dL   Glucose, Bld 108 (*) 70 - 99 mg/dL   Calcium, Ion 1.25 (*) 1.12 - 1.23 mmol/L   TCO2 25  0 - 100 mmol/L   Hemoglobin 13.9  12.0 - 15.0 g/dL   HCT 41.0  36.0 - 46.0 %   Dg Foot Complete Left  10/25/2013  CLINICAL DATA:  Metatarsal pain  EXAM: LEFT FOOT - COMPLETE 3+ VIEW  COMPARISON:  None.  FINDINGS: There is no evidence of fracture or dislocation. There is no evidence of arthropathy or other focal bone abnormality. Soft tissues are unremarkable.  IMPRESSION: Negative.   Electronically Signed   By: Kathreen Devoid   On: 10/25/2013 09:18      MDM   UTI Left foot stress fracture  Patient here with complaints of RLQ abdominal and flank pain - no CVA tenderness to I doubt pyelonephritis, no fever or any other symptoms to suggest appendicitis, I also doubt kidney stone  as well because of the history.  No fever, chills, labs reassuring.  Patient to be discharge home with pain control for the fracture and with antibiotics.  Will send the urine for culture.    Idalia Needle Joelyn Oms, PA-C 10/26/13 2009

## 2013-10-26 NOTE — ED Notes (Signed)
MD at bedside. 

## 2013-10-26 NOTE — ED Provider Notes (Signed)
Medical screening examination/treatment/procedure(s) were performed by non-physician practitioner and as supervising physician I was immediately available for consultation/collaboration.   EKG Interpretation None       Orlie Dakin, MD 10/26/13 2040

## 2013-10-27 ENCOUNTER — Encounter (HOSPITAL_COMMUNITY): Payer: Self-pay | Admitting: Emergency Medicine

## 2013-10-27 ENCOUNTER — Emergency Department (HOSPITAL_COMMUNITY)
Admission: EM | Admit: 2013-10-27 | Discharge: 2013-10-27 | Disposition: A | Discharge status: Home / Self Care | Payer: No Typology Code available for payment source | Attending: Emergency Medicine | Admitting: Emergency Medicine

## 2013-10-27 ENCOUNTER — Emergency Department (HOSPITAL_COMMUNITY): Payer: No Typology Code available for payment source

## 2013-10-27 DIAGNOSIS — Z8619 Personal history of other infectious and parasitic diseases: Secondary | ICD-10-CM | POA: Insufficient documentation

## 2013-10-27 DIAGNOSIS — Z8613 Personal history of malaria: Secondary | ICD-10-CM | POA: Insufficient documentation

## 2013-10-27 DIAGNOSIS — N201 Calculus of ureter: Secondary | ICD-10-CM | POA: Insufficient documentation

## 2013-10-27 DIAGNOSIS — N23 Unspecified renal colic: Secondary | ICD-10-CM

## 2013-10-27 DIAGNOSIS — N390 Urinary tract infection, site not specified: Secondary | ICD-10-CM | POA: Insufficient documentation

## 2013-10-27 DIAGNOSIS — Z8739 Personal history of other diseases of the musculoskeletal system and connective tissue: Secondary | ICD-10-CM | POA: Insufficient documentation

## 2013-10-27 DIAGNOSIS — R112 Nausea with vomiting, unspecified: Secondary | ICD-10-CM | POA: Insufficient documentation

## 2013-10-27 DIAGNOSIS — Z8632 Personal history of gestational diabetes: Secondary | ICD-10-CM | POA: Insufficient documentation

## 2013-10-27 DIAGNOSIS — Z862 Personal history of diseases of the blood and blood-forming organs and certain disorders involving the immune mechanism: Secondary | ICD-10-CM | POA: Insufficient documentation

## 2013-10-27 DIAGNOSIS — E669 Obesity, unspecified: Secondary | ICD-10-CM | POA: Insufficient documentation

## 2013-10-27 DIAGNOSIS — Z8781 Personal history of (healed) traumatic fracture: Secondary | ICD-10-CM | POA: Insufficient documentation

## 2013-10-27 LAB — URINE CULTURE
Colony Count: 7000
Special Requests: NORMAL

## 2013-10-27 LAB — GC/CHLAMYDIA PROBE AMP
CT PROBE, AMP APTIMA: NEGATIVE
GC Probe RNA: NEGATIVE

## 2013-10-27 LAB — HIV ANTIBODY (ROUTINE TESTING W REFLEX): HIV 1&2 Ab, 4th Generation: NONREACTIVE

## 2013-10-27 MED ORDER — OXYCODONE-ACETAMINOPHEN 5-325 MG PO TABS: 1.0000 | ORAL_TABLET | ORAL | Status: DC | PRN

## 2013-10-27 MED ORDER — ONDANSETRON 4 MG PO TBDP
4.0000 mg | ORAL_TABLET | Freq: Once | ORAL | Status: AC
  Administered 2013-10-27: 4 mg via ORAL
  Filled 2013-10-27: qty 1

## 2013-10-27 MED ORDER — ONDANSETRON HCL 8 MG PO TABS: 8.0000 mg | ORAL_TABLET | Freq: Three times a day (TID) | ORAL | Status: DC | PRN

## 2013-10-27 MED ORDER — ONDANSETRON 4 MG PO TBDP: 4.0000 mg | ORAL_TABLET | Freq: Three times a day (TID) | ORAL | Status: DC | PRN

## 2013-10-27 MED ORDER — HYDROCODONE-ACETAMINOPHEN 5-325 MG PO TABS
1.0000 | ORAL_TABLET | Freq: Once | ORAL | Status: AC
  Administered 2013-10-27: 1 via ORAL
  Filled 2013-10-27: qty 1

## 2013-10-27 MED ORDER — TAMSULOSIN HCL 0.4 MG PO CAPS: ORAL_CAPSULE | ORAL | Status: DC

## 2013-10-27 NOTE — ED Notes (Signed)
Pt states that she was seen at [facility] earlier this evening and was diagnosed with a UTI, flank pain and foot pain; pt was prescribed pain medication and an antibiotic; pt states that she got the Tramadol and Septra filled and after taking the medications she became nauseous and vomited x 2; pt states that she has another pain medication at the pharmacy to pick up in the morning but that the Tramadol has not helped the pain.

## 2013-10-27 NOTE — ED Provider Notes (Signed)
CSN: [id number]     Arrival date & time 10/27/13  9833 History   First MD Initiated Contact with Patient 10/27/13 0430     Chief Complaint  Patient presents with  . Abdominal Pain     (Consider location/radiation/quality/duration/timing/severity/associated sxs/prior Treatment) HPI Comments: Patient was seen 2 days ago, at urgent care for foot pain and diagnosed with a stress fracture.  She was seen earlier this evening for flank pain, diagnosed with UTI.  Discharge him with prescription for Septra, and tramadol.  Patient states she took his medications about 10:30 she woke at 2 with nausea, and vomited several times.  Denies any diarrhea, or fever  Patient is a 33 y.o. female presenting with abdominal pain. The history is provided by the patient.  Abdominal Pain Pain location:  R flank Pain quality: aching   Pain radiates to:  Does not radiate Timing:  Constant Progression:  Unchanged Chronicity:  New Relieved by:  Nothing Worsened by:  Palpation Ineffective treatments:  None tried Associated symptoms: nausea and vomiting   Associated symptoms: no dysuria and no fever     Past Medical History  Diagnosis Date  . Gestational diabetes     GDM with last pregnancy (2008)  . History of hepatitis B 2007  . History of malaria 2003  . Difficulty reading   . History of gestational diabetes   . Obese   . History of thrombocytopenia   . Abnormal Pap smear     ascus with High Risk for HPV  . De Quervain's tenosynovitis, left    Past Surgical History  Procedure Laterality Date  . Cesarean section  2008    last pregnacy  . Cesarean section  08/21/2011    Procedure: CESAREAN SECTION;  Surgeon: Donnamae Jude, MD;  Location: [facility];  Service: Gynecology;  Laterality: N/A;   Family History  Problem Relation Age of Onset  . Hypertension Mother    History  Substance Use Topics  . Smoking status: Never Smoker   . Smokeless tobacco: Not on file  . Alcohol Use: No   OB History   Grav Para Term Preterm Abortions TAB SAB Ect Mult Living   5 3 3  0 2  1 0 0 3     Review of Systems  Constitutional: Negative for fever.  Gastrointestinal: Positive for nausea, vomiting and abdominal pain.  Genitourinary: Positive for flank pain. Negative for dysuria and frequency.  Neurological: Negative for dizziness and headaches.  All other systems reviewed and are negative.     Allergies  Chloroquine and Cephalexin  Home Medications   Prior to Admission medications   Medication Sig Start Date End Date Taking? Authorizing Provider  acetaminophen (TYLENOL) 325 MG tablet Take 650 mg by mouth every 6 (six) hours as needed for mild pain, fever or headache.    Yes Historical Provider, MD  sulfamethoxazole-trimethoprim (SEPTRA DS) 800-160 MG per tablet Take 1 tablet by mouth every 12 (twelve) hours. 10/26/13  Yes Joaquim Lai C. Sanford, PA-C  traMADol (ULTRAM) 50 MG tablet Take by mouth every 6 (six) hours as needed for moderate pain.   Yes Historical Provider, MD   BP 109/90  Pulse 64  Temp(Src) 97.8 F (36.6 C) (Oral)  Resp 20  SpO2 100%  LMP 06/27/2013 Physical Exam  Constitutional: She is oriented to person, place, and time. She appears well-developed and well-nourished.  Obese  HENT:  Head: Normocephalic.  Eyes: Pupils are equal, round, and reactive to light.  Neck: Normal range of  motion.  Cardiovascular: Normal rate and regular rhythm.   Pulmonary/Chest: Effort normal and breath sounds normal.  Abdominal: Soft. Bowel sounds are normal. She exhibits no distension. There is no tenderness. There is no CVA tenderness.  Musculoskeletal: She exhibits no edema and no tenderness.  Neurological: She is alert and oriented to person, place, and time.  Skin: Skin is warm and dry.    ED Course  Procedures (including critical care time) Labs Review Labs Reviewed - No data to display  Imaging Review Dg Foot Complete Left  10/25/2013   CLINICAL DATA:  Metatarsal pain  EXAM: LEFT  FOOT - COMPLETE 3+ VIEW  COMPARISON:  None.  FINDINGS: There is no evidence of fracture or dislocation. There is no evidence of arthropathy or other focal bone abnormality. Soft tissues are unremarkable.  IMPRESSION: Negative.   Electronically Signed   By: Kathreen Devoid   On: 10/25/2013 09:18     EKG Interpretation None      MDM  Patient presents for pain control.  I doubt the nausea and vomiting.  Was a result of Septra, and Ultram, that she took 4 hours prior to vomiting, but most likely as a result of the urinary tract, infection.  She'll be medicated in the emergency department, with Zofran, and Vicodin for her pain.  She will pick up her additional prescription for pain control at the pharmacy in the morning.  "She does not know what this is" She has been encouraged to complete her antibiotics while awaiting the urine culture, and follow up with her primary care physician as needed. Patient has no CVA tenderness.  No reproducible abdominal pain.  She appears quite comfortable Final diagnoses:  Nausea & vomiting         Garald Balding, NP 10/27/13 [phone]

## 2013-10-27 NOTE — ED Notes (Signed)
Returned from CT.

## 2013-10-27 NOTE — ED Notes (Signed)
Pt alert and oriented x4. Respirations even and unlabored, bilateral symmetrical rise and fall of chest. Skin warm and dry. In no acute distress. Denies needs.   

## 2013-10-27 NOTE — ED Provider Notes (Signed)
Reevaluation, for consideration of discharge.  Patient was seen earlier by another ED providers. She had a CT scan that returned showing a 5 mm right mid ureter stone with proximal obstruction.  Medications  ondansetron (ZOFRAN-ODT) disintegrating tablet 4 mg (4 mg Oral Given 10/27/13 0459)  HYDROcodone-acetaminophen (NORCO/VICODIN) 5-325 MG per tablet 1 tablet (1 tablet Oral Given 10/27/13 0459)  ondansetron (ZOFRAN-ODT) disintegrating tablet 4 mg (4 mg Oral Given 10/27/13 0545)    Patient Vitals for the past 24 hrs:  BP Temp Temp src Pulse Resp SpO2  10/27/13 0815 100/60 mmHg 97.9 F (36.6 C) Oral 50 18 97 %  10/27/13 0334 109/90 mmHg 97.8 F (36.6 C) Oral 64 20 100 %   Results for orders placed during the hospital encounter of 10/26/13  GC/CHLAMYDIA PROBE AMP      Result Value Ref Range   CT Probe RNA NEGATIVE  NEGATIVE   GC Probe RNA NEGATIVE  NEGATIVE  WET PREP, GENITAL      Result Value Ref Range   Yeast Wet Prep HPF POC NONE SEEN  NONE SEEN   Trich, Wet Prep NONE SEEN  NONE SEEN   Clue Cells Wet Prep HPF POC NONE SEEN  NONE SEEN   WBC, Wet Prep HPF POC FEW (*) NONE SEEN  URINALYSIS, ROUTINE W REFLEX MICROSCOPIC      Result Value Ref Range   Color, Urine AMBER (*) YELLOW   APPearance CLOUDY (*) CLEAR   Specific Gravity, Urine 1.027  1.005 - 1.030   pH 5.5  5.0 - 8.0   Glucose, UA NEGATIVE  NEGATIVE mg/dL   Hgb urine dipstick LARGE (*) NEGATIVE   Bilirubin Urine NEGATIVE  NEGATIVE   Ketones, ur NEGATIVE  NEGATIVE mg/dL   Protein, ur 30 (*) NEGATIVE mg/dL   Urobilinogen, UA 0.2  0.0 - 1.0 mg/dL   Nitrite NEGATIVE  NEGATIVE   Leukocytes, UA NEGATIVE  NEGATIVE  URINE MICROSCOPIC-ADD ON      Result Value Ref Range   Squamous Epithelial / LPF FEW (*) RARE   WBC, UA 0-2  <3 WBC/hpf   RBC / HPF TOO NUMEROUS TO COUNT  <3 RBC/hpf   Bacteria, UA RARE  RARE  RPR      Result Value Ref Range   RPR NON REAC  NON REAC  HIV ANTIBODY (ROUTINE TESTING)      Result Value Ref Range    HIV 1&2 Ab, 4th Generation NONREACTIVE  NONREACTIVE  CBC WITH DIFFERENTIAL      Result Value Ref Range   WBC 3.3 (*) 4.0 - 10.5 K/uL   RBC 4.53  3.87 - 5.11 MIL/uL   Hemoglobin 13.3  12.0 - 15.0 g/dL   HCT 39.2  36.0 - 46.0 %   MCV 86.5  78.0 - 100.0 fL   MCH 29.4  26.0 - 34.0 pg   MCHC 33.9  30.0 - 36.0 g/dL   RDW 13.5  11.5 - 15.5 %   Platelets 120 (*) 150 - 400 K/uL   Neutrophils Relative % 56  43 - 77 %   Neutro Abs 1.9  1.7 - 7.7 K/uL   Lymphocytes Relative 35  12 - 46 %   Lymphs Abs 1.2  0.7 - 4.0 K/uL   Monocytes Relative 8  3 - 12 %   Monocytes Absolute 0.3  0.1 - 1.0 K/uL   Eosinophils Relative 1  0 - 5 %   Eosinophils Absolute 0.0  0.0 - 0.7 K/uL   Basophils  Relative 0  0 - 1 %   Basophils Absolute 0.0  0.0 - 0.1 K/uL  POC URINE PREG, ED      Result Value Ref Range   Preg Test, Ur NEGATIVE  NEGATIVE  I-STAT CHEM 8, ED      Result Value Ref Range   Sodium 143  137 - 147 mEq/L   Potassium 3.7  3.7 - 5.3 mEq/L   Chloride 104  96 - 112 mEq/L   BUN 11  6 - 23 mg/dL   Creatinine, Ser 0.70  0.50 - 1.10 mg/dL   Glucose, Bld 108 (*) 70 - 99 mg/dL   Calcium, Ion 1.25 (*) 1.12 - 1.23 mmol/L   TCO2 25  0 - 100 mmol/L   Hemoglobin 13.9  12.0 - 15.0 g/dL   HCT 41.0  36.0 - 46.0 %   Ct Abdomen Pelvis Wo Contrast  10/27/2013   CLINICAL DATA:  Right flank pain recent diagnosis of UTI. Microhematuria.  EXAM: CT ABDOMEN AND PELVIS WITHOUT CONTRAST  TECHNIQUE: Multidetector CT imaging of the abdomen and pelvis was performed following the standard protocol without intravenous contrast.  COMPARISON:  None.  FINDINGS: The lung bases are clear. There is right-sided pyelocaliectasis and ureterectasis down to the level of the mid/distal ureter, at the level of the lower sacrum, were there is a 5 mm stone. Small punctate size to intrarenal stones are also demonstrated bilaterally. No evidence of obstruction on the left.  The unenhanced appearance of the liver, spleen, gallbladder, pancreas,  adrenal glands, abdominal aorta, inferior vena cava, and retroperitoneal lymph nodes is unremarkable. The stomach and small bowel are decompressed. Stool-filled colon without distention. No free air or free fluid in the abdomen.  Pelvis: Uterus and ovaries are not enlarged. No free or loculated pelvic fluid collections. Appendix is normal. No evidence of diverticulitis. No pelvic mass or lymphadenopathy. No destructive bone lesions.  IMPRESSION: 5 mm stone in the mid/ distal right ureter with moderate proximal obstruction. Bilateral punctate size nonobstructing intrarenal stones.   Electronically Signed   By: Lucienne Capers M.D.   On: 10/27/2013 06:55   Dg Foot Complete Left  10/25/2013   CLINICAL DATA:  Metatarsal pain  EXAM: LEFT FOOT - COMPLETE 3+ VIEW  COMPARISON:  None.  FINDINGS: There is no evidence of fracture or dislocation. There is no evidence of arthropathy or other focal bone abnormality. Soft tissues are unremarkable.  IMPRESSION: Negative.   Electronically Signed   By: Kathreen Devoid   On: 10/25/2013 09:18   8:31 AM Reevaluation with update and discussion. After initial assessment and treatment, an updated evaluation reveals see below . Richarda Blade    Face-to-face evaluation   History: Patient with flank pain that started, last evening. At this time, her pain is controlled. She had nausea earlier, but is now better   Physical exam:Alert, calm, cooperative. She appears comfortable.   Medical screening examination/treatment/procedure(s) were conducted as a shared visit with non-physician practitioner(s) and myself.  I personally evaluated the patient during the encounter   Nursing Notes Reviewed/ Care Coordinated Applicable Imaging Reviewed Interpretation of Laboratory Data incorporated into ED treatment  The patient appears reasonably screened and/or stabilized for discharge and I doubt any other medical condition or other [facility] requiring further screening, evaluation, or  treatment in the ED at this time prior to discharge.  Plan: Home Medications- Stop Tramadol, RX: Percocet, Zofran, Flomax; Home Treatments- Strain Urine; return here if the recommended treatment, does not improve the  symptoms; Recommended follow up- Urology f/u 1 week  Richarda Blade, MD 10/27/13 [phone]

## 2013-10-27 NOTE — ED Notes (Signed)
FNP, Olean Ree notified that pt is having active vomiting

## 2013-10-27 NOTE — ED Notes (Signed)
Pt escorted to discharge window. Pt verbalized understanding discharge instructions. In no acute distress.  

## 2013-10-27 NOTE — Progress Notes (Signed)
[facility] contacted patient about [employer]. CL, with patient permission, scheduled a f/u apt with PCP [facility] on Thursday, April 30th at 11 am. Contacted patient and provided her with apt information.

## 2013-10-27 NOTE — Discharge Instructions (Signed)
Strain your urine, to catch the stone. Most stones will come out in 2 or 3 days. Save the stone, after you collect it. Followup with the urologist to discuss treatment. Return here, if needed, for problems.   Nausea and Vomiting Nausea is a sick feeling that often comes before throwing up (vomiting). Vomiting is a reflex where stomach contents come out of your mouth. Vomiting can cause severe loss of body fluids (dehydration). Children and elderly adults can become dehydrated quickly, especially if they also have diarrhea. Nausea and vomiting are symptoms of a condition or disease. It is important to find the cause of your symptoms. CAUSES   Direct irritation of the stomach lining. This irritation can result from increased acid production (gastroesophageal reflux disease), infection, food poisoning, taking certain medicines (such as nonsteroidal anti-inflammatory drugs), alcohol use, or tobacco use.  Signals from the brain.These signals could be caused by a headache, heat exposure, an inner ear disturbance, increased pressure in the brain from injury, infection, a tumor, or a concussion, pain, emotional stimulus, or metabolic problems.  An obstruction in the gastrointestinal tract (bowel obstruction).  Illnesses such as diabetes, hepatitis, gallbladder problems, appendicitis, kidney problems, cancer, sepsis, atypical symptoms of a heart attack, or eating disorders.  Medical treatments such as chemotherapy and radiation.  Receiving medicine that makes you sleep (general anesthetic) during surgery. DIAGNOSIS Your caregiver may ask for tests to be done if the problems do not improve after a few days. Tests may also be done if symptoms are severe or if the reason for the nausea and vomiting is not clear. Tests may include:  Urine tests.  Blood tests.  Stool tests.  Cultures (to look for evidence of infection).  X-rays or other imaging studies. Test results can help your caregiver make  decisions about treatment or the need for additional tests. TREATMENT You need to stay well hydrated. Drink frequently but in small amounts.You may wish to drink water, sports drinks, clear broth, or eat frozen ice pops or gelatin dessert to help stay hydrated.When you eat, eating slowly may help prevent nausea.There are also some antinausea medicines that may help prevent nausea. HOME CARE INSTRUCTIONS   Take all medicine as directed by your caregiver.  If you do not have an appetite, do not force yourself to eat. However, you must continue to drink fluids.  If you have an appetite, eat a normal diet unless your caregiver tells you differently.  Eat a variety of complex carbohydrates (rice, wheat, potatoes, bread), lean meats, yogurt, fruits, and vegetables.  Avoid high-fat foods because they are more difficult to digest.  Drink enough water and fluids to keep your urine clear or pale yellow.  If you are dehydrated, ask your caregiver for specific rehydration instructions. Signs of dehydration may include:  Severe thirst.  Dry lips and mouth.  Dizziness.  Dark urine.  Decreasing urine frequency and amount.  Confusion.  Rapid breathing or pulse. SEEK IMMEDIATE MEDICAL CARE IF:   You have blood or brown flecks (like coffee grounds) in your vomit.  You have black or bloody stools.  You have a severe headache or stiff neck.  You are confused.  You have severe abdominal pain.  You have chest pain or trouble breathing.  You do not urinate at least once every 8 hours.  You develop cold or clammy skin.  You continue to vomit for longer than 24 to 48 hours.  You have a fever. MAKE SURE YOU:   Understand these instructions.  Will watch your condition.  Will get help right away if you are not doing well or get worse. Document Released: 06/24/2005 Document Revised: 09/16/2011 Document Reviewed: 11/21/2010 [facility] Patient Information 2014 [place],  [place]. Continue taking the antibiotic for your urinary tract, infection.  It also been given a prescription to help control, nausea and vomiting.  Take this as needed.  Follow up with your primary care physician    Kidney Stones Kidney stones (urolithiasis) are deposits that form inside your kidneys. The intense pain is caused by the stone moving through the urinary tract. When the stone moves, the ureter goes into spasm around the stone. The stone is usually passed in the urine.  CAUSES   A disorder that makes certain neck glands produce too much parathyroid hormone (primary hyperparathyroidism).  A buildup of uric acid crystals, similar to gout in your joints.  Narrowing (stricture) of the ureter.  A kidney obstruction present at birth (congenital obstruction).  Previous surgery on the kidney or ureters.  Numerous kidney infections. SYMPTOMS   Feeling sick to your stomach (nauseous).  Throwing up (vomiting).  Blood in the urine (hematuria).  Pain that usually spreads (radiates) to the groin.  Frequency or urgency of urination. DIAGNOSIS   Taking a history and physical exam.  Blood or urine tests.  CT scan.  Occasionally, an examination of the inside of the urinary bladder (cystoscopy) is performed. TREATMENT   Observation.  Increasing your fluid intake.  Extracorporeal shock wave lithotripsy This is a noninvasive procedure that uses shock waves to break up kidney stones.  Surgery may be needed if you have severe pain or persistent obstruction. There are various surgical procedures. Most of the procedures are performed with the use of small instruments. Only small incisions are needed to accommodate these instruments, so recovery time is minimized. The size, location, and chemical composition are all important variables that will determine the proper choice of action for you. Talk to your health care provider to better understand your situation so that you will  minimize the risk of injury to yourself and your kidney.  HOME CARE INSTRUCTIONS   Drink enough water and fluids to keep your urine clear or pale yellow. This will help you to pass the stone or stone fragments.  Strain all urine through the provided strainer. Keep all particulate matter and stones for your health care provider to see. The stone causing the pain may be as small as a grain of salt. It is very important to use the strainer each and every time you pass your urine. The collection of your stone will allow your health care provider to analyze it and verify that a stone has actually passed. The stone analysis will often identify what you can do to reduce the incidence of recurrences.  Only take over-the-counter or prescription medicines for pain, discomfort, or fever as directed by your health care provider.  Make a follow-up appointment with your health care provider as directed.  Get follow-up X-rays if required. The absence of pain does not always mean that the stone has passed. It may have only stopped moving. If the urine remains completely obstructed, it can cause loss of kidney function or even complete destruction of the kidney. It is your responsibility to make sure X-rays and follow-ups are completed. Ultrasounds of the kidney can show blockages and the status of the kidney. Ultrasounds are not associated with any radiation and can be performed easily in a matter of minutes. SEEK MEDICAL CARE IF:  You experience pain that is progressive and unresponsive to any pain medicine you have been prescribed. SEEK IMMEDIATE MEDICAL CARE IF:   Pain cannot be controlled with the prescribed medicine.  You have a fever or shaking chills.  The severity or intensity of pain increases over 18 hours and is not relieved by pain medicine.  You develop a new onset of abdominal pain.  You feel faint or pass out.  You are unable to urinate. MAKE SURE YOU:   Understand these  instructions.  Will watch your condition.  Will get help right away if you are not doing well or get worse. Document Released: 06/24/2005 Document Revised: 02/24/2013 Document Reviewed: 11/25/2012 [facility] Patient Information 2014 [place].  Diet for Kidney Stones Kidney stones are small, hard masses that form inside your kidneys. They are made up of salts and minerals and often form when high levels build up in the urine. The minerals can then start to build up, crystalize, and stick together to form stones. There are several different types of kidney stones. The following types of stones may be influenced by dietary factors:   Calcium Oxalate Stones. An oxalate is a salt found in certain foods. Within the body, calcium can combine with oxalates to form calcium oxalate stones, which can be excreted in the urine in high amounts. This is the most common type of kidney stone.  Calcium Phosphate Stones. These stones may occur when the pH of the urine becomes too high, or less acidic, from too much calcium being excreted in the urine. The pH is a measure of how acidic or basic a substance is.  Uric Acid Stones. This type of stone occurs when the pH of the urine becomes too low, or very acidic, because substances called purines build up in the urine. Purines are found in animal proteins. When the urine is highly concentrated with acid, uric acid kidney stones can form.  Other risk factors for kidney stones include genetics, environment, and being overweight. Your caregiver may ask you to follow specific diet guidelines based on the type of stone you have to lessen the chances of your body making more kidney stones.  GENERAL GUIDELINES FOR ALL TYPES OF STONES  Drink plenty of fluid. Drink 12 16 cups of fluid a day, drinking mainly water.This is the most important thing you can do to prevent the formation of future kidney stones.  Maintain a healthy weight. Your caregiver or dietitian can help  you determine what a healthy weight is for you. If you are overweight, weight loss may help prevent the formation of future kidney stones.  Eat a diet adequate in animal protein. Too much animal protein can contribute to the formation of stones. Your dietitian can help you determine how much protein you should be eating. Avoid low carbohydrate, high protein diets.  Follow a balanced eating approach. The DASH diet, which stands for "Dietary Approaches to Stop Hypertension," is an effective meal plan for reducing stone formation. This diet is high in fruits, vegetables, dairy, and whole grains and low in animal protein. Ask your caregiver or dietitian for information about the DASH diet. ADDITIONAL DIET GUIDELINES FOR CALCIUM STONES Avoid foods high in salt. This includes table salt, salt seasonings, MSG, soy sauce, cured and processed meats, salted crackers and snack foods, fast food, and canned soups and foods. Ask your caregiver or dietitian for information about reducing sodium in your diet or following the low sodium diet.  Ensure adequate calcium intake. Use  the following table for calcium guidelines:  Men 2 years old and younger  1000 mg/day.  Men 23 years old and older  1500 mg/day.  Women 14 32 years old  1000 mg/day.  Women 50 years and older  1500 mg/day. Your dietitian can help you determine if you are getting enough calcium in your diet. Foods that are high in calcium include dairy products, broccoli, cheese, yogurt, and pudding. If you need to take a calcium supplement, take it only in the form of calcium citrate.  Avoid foods high in oxalate. Be sure that any supplements you take do not contain more than 500 mg of vitamin C. Vitamin C is converted into oxalate in the body. You do not need to avoid fruits and vegetables high in vitamin C.   Grains: High-fiber or bran cereal, whole-wheat bread, grits, barley, buckwheat, amaranth, pretzels, and fruitcake.  Vegetables: Dried beans, wax  beans, dark leafy greens, eggplant, leeks, okra, parsley, rutabaga, tomato paste, watercress, zucchini, and escarole.  Fruit: Dried apricots, red currants, figs, kiwi, and rhubarb.  Meat and Meat Substitutes: Soybeans and foods made from soy (soyburger, miso), dried beans, peanut butter.  Milk: Chocolate milk mixes and soymilk.  Fats and Oils: Nuts (peanuts, almonds, pecans, cashews, hazelnuts) and nut butters, sesame seeds, and tDahini paste.  Condiments/Miscellaneous: Chocolate, carob, marmalade, poppy seeds, instant iced tea, and juice from high-oxalate fruits.  Document Released: 10/19/2010 Document Revised: 12/24/2011 Document Reviewed: 12/09/2011 [facility] Patient Information 2014 [place].  Ureteral Colic (Kidney Stones) Ureteral colic is the result of a condition when kidney stones form inside the kidney. Once kidney stones are formed they may move into the tube that connects the kidney with the bladder (ureter). If this occurs, this condition may cause pain (colic) in the ureter.  CAUSES  Pain is caused by stone movement in the ureter and the obstruction caused by the stone. SYMPTOMS  The pain comes and goes as the ureter contracts around the stone. The pain is usually intense, sharp, and stabbing in character. The location of the pain may move as the stone moves through the ureter. When the stone is near the kidney the pain is usually located in the back and radiates to the belly (abdomen). When the stone is ready to pass into the bladder the pain is often located in the lower abdomen on the side the stone is located. At this location, the symptoms may mimic those of a urinary tract infection with urinary frequency. Once the stone is located here it often passes into the bladder and the pain disappears completely. TREATMENT   Your caregiver will provide you with medicine for pain relief.  You may require specialized follow-up X-rays.  The absence of pain does not always  mean that the stone has passed. It may have just stopped moving. If the urine remains completely obstructed, it can cause loss of kidney function or even complete destruction of the involved kidney. It is your responsibility and in your interest that X-rays and follow-ups as suggested by your caregiver are completed. Relief of pain without passage of the stone can be associated with severe damage to the kidney, including loss of kidney function on that side.  If your stone does not pass on its own, additional measures may be taken by your caregiver to ensure its removal. HOME CARE INSTRUCTIONS   Increase your fluid intake. Water is the preferred fluid since juices containing vitamin C may acidify the urine making it less likely for certain  stones (uric acid stones) to pass.  Strain all urine. A strainer will be provided. Keep all particulate matter or stones for your caregiver to inspect.  Take your pain medicine as directed.  Make a follow-up appointment with your caregiver as directed.  Remember that the goal is passage of your stone. The absence of pain does not mean the stone is gone. Follow your caregiver's instructions.  Only take over-the-counter or prescription medicines for pain, discomfort, or fever as directed by your caregiver. SEEK MEDICAL CARE IF:   Pain cannot be controlled with the prescribed medicine.  You have a fever.  Pain continues for longer than your caregiver advises it should.  There is a change in the pain, and you develop chest discomfort or constant abdominal pain.  You feel faint or pass out. MAKE SURE YOU:   Understand these instructions.  Will watch your condition.  Will get help right away if you are not doing well or get worse. Document Released: 04/03/2005 Document Revised: 10/19/2012 Document Reviewed: 12/19/2010 [facility] Patient Information 2014 [place], [place].

## 2013-10-28 NOTE — ED Provider Notes (Signed)
Medical screening examination/treatment/procedure(s) were performed by non-physician practitioner and as supervising physician I was immediately available for consultation/collaboration.    Teressa Lower, MD 10/28/13 [phone]

## 2013-11-04 ENCOUNTER — Encounter: Payer: Self-pay | Admitting: Internal Medicine

## 2013-11-04 ENCOUNTER — Ambulatory Visit: Payer: No Typology Code available for payment source | Attending: Internal Medicine | Admitting: Internal Medicine

## 2013-11-04 VITALS — BP 92/63 | HR 71 | Temp 98.5°F | Resp 20 | Ht 60.5 in | Wt 190.6 lb

## 2013-11-04 DIAGNOSIS — N926 Irregular menstruation, unspecified: Secondary | ICD-10-CM

## 2013-11-04 DIAGNOSIS — E119 Type 2 diabetes mellitus without complications: Secondary | ICD-10-CM

## 2013-11-04 DIAGNOSIS — Z8632 Personal history of gestational diabetes: Secondary | ICD-10-CM

## 2013-11-04 DIAGNOSIS — Z7189 Other specified counseling: Secondary | ICD-10-CM

## 2013-11-04 DIAGNOSIS — Z7689 Persons encountering health services in other specified circumstances: Secondary | ICD-10-CM

## 2013-11-04 DIAGNOSIS — N200 Calculus of kidney: Secondary | ICD-10-CM

## 2013-11-04 LAB — LIPID PANEL
CHOL/HDL RATIO: 3.2 ratio
Cholesterol: 153 mg/dL (ref 0–200)
HDL: 48 mg/dL (ref >39)
LDL Cholesterol: 94 mg/dL (ref 0–99)
Triglycerides: 56 mg/dL (ref <150)
VLDL: 11 mg/dL (ref 0–40)

## 2013-11-04 LAB — POCT GLYCOSYLATED HEMOGLOBIN (HGB A1C): HEMOGLOBIN A1C: 6.4

## 2013-11-04 LAB — GLUCOSE, POCT (MANUAL RESULT ENTRY): POC GLUCOSE: 147 mg/dL — AB (ref 70–99)

## 2013-11-04 MED ORDER — METFORMIN HCL 500 MG PO TABS: 500.0000 mg | ORAL_TABLET | Freq: Two times a day (BID) | ORAL | Status: DC

## 2013-11-04 NOTE — Progress Notes (Signed)
Patient ID: Brenda Cannon, female   DOB: 07/31/1980, 33 y.o.   MRN: [id number]   BMW:[id number]  [id number]  DOB - [birth year]  CC:  Chief Complaint  Patient presents with  . Establish Care       HPI: Brenda Cannon is a 33 y.o. female here today to establish medical care.  Patient has a past medical history of gestational diabetes, obesity, and malaria Patient reports she was seen in the the emergency department for kidney stones. She was given Flomax to help facilitate passage of the stone. He presents today with the stone that passed and she has discontinued Flomax. Patient reports she was diagnosed with gestational diabetes in 2008 and wants to be tested for diabetes mellitus she reports a history of irregular cycles ranging from last year. Patient states she was on Depo-Provera after her last pregnancy and completed the last cycle in June 2014.  Patient states cycles make come 1 month and skip 3 months before another menstrual cycle comes. Patient reports she had a Pap smear this year but does not know the results. She has a boot on her left foot for stress fracture she has had for the past 3 weeks, she is to followup with orthopedics on May 11th.    Patient has No headache, No chest pain, No abdominal pain - No Nausea, No new weakness tingling or numbness, No Cough - SOB.  Allergies  Allergen Reactions  . Chloroquine Hives  . Cephalexin Itching and Rash   Past Medical History  Diagnosis Date  . Gestational diabetes     GDM with last pregnancy (2008)  . History of hepatitis B 2007  . History of malaria 2003  . Difficulty reading   . History of gestational diabetes   . Obese   . History of thrombocytopenia   . Abnormal Pap smear     ascus with High Risk for HPV  . De Quervain's tenosynovitis, left    Current Outpatient Prescriptions on File Prior to Visit  Medication Sig Dispense Refill  . acetaminophen (TYLENOL) 325 MG tablet Take 650 mg by mouth every 6 (six) hours  as needed for mild pain, fever or headache.       . ondansetron (ZOFRAN) 8 MG tablet Take 1 tablet (8 mg total) by mouth every 8 (eight) hours as needed for nausea or vomiting.  20 tablet  0  . oxyCODONE-acetaminophen (PERCOCET) 5-325 MG per tablet Take 1 tablet by mouth every 4 (four) hours as needed for severe pain.  20 tablet  0  . sulfamethoxazole-trimethoprim (SEPTRA DS) 800-160 MG per tablet Take 1 tablet by mouth every 12 (twelve) hours.  10 tablet  0   No current facility-administered medications on file prior to visit.   Family History  Problem Relation Age of Onset  . Hypertension Mother   . Stroke Mother    History   Social History  . Marital Status: Single    Spouse Name: N/A    Number of Children: N/A  . Years of Education: N/A   Occupational History  . Not on file.   Social History Main Topics  . Smoking status: Never Smoker   . Smokeless tobacco: Not on file  . Alcohol Use: No  . Drug Use: No  . Sexual Activity: Not on file   Other Topics Concern  . Not on file   Social History Narrative   ** Merged History Encounter **        Review of Systems: Constitutional:  Negative for fever, chills, diaphoresis, activity change, appetite change and fatigue. HENT: Negative for ear pain, nosebleeds, congestion, facial swelling, rhinorrhea, neck pain, neck stiffness and ear discharge.  Eyes: Negative for pain, discharge, redness, itching and visual disturbance. Respiratory: Negative for cough, choking, chest tightness, shortness of breath, wheezing and stridor.  Cardiovascular: Negative for chest pain, palpitations. Positive for swelling in left foot Gastrointestinal: Negative for abdominal distention. Genitourinary: Negative for dysuria, urgency, frequency, hematuria, flank pain, decreased urine volume, difficulty urinating and dyspareunia.  Musculoskeletal: Negative for back pain, joint swelling, arthralgia and gait problem. Neurological: Negative for dizziness,  tremors, seizures, syncope, facial asymmetry, speech difficulty, weakness, light-headedness, numbness and headaches.  Hematological: Negative for adenopathy. Does not bruise/bleed easily. Psychiatric/Behavioral: Negative for hallucinations, behavioral problems, confusion, dysphoric mood, decreased concentration and agitation.    Objective:   Filed Vitals:   11/04/13 1104  BP: 92/63  Pulse: 71  Temp: 98.5 F (36.9 C)  Resp: 20    Physical Exam: Constitutional: Patient appears well-developed and well-nourished. No distress. HENT: Normocephalic, atraumatic, External right and left ear normal. Oropharynx is clear and moist.  Eyes: Conjunctivae and EOM are normal. PERRLA, no scleral icterus. Neck: Normal ROM. Neck supple. No JVD. No tracheal deviation. No thyromegaly. CVS: RRR, S1/S2 +, no murmurs, no gallops, no carotid bruit.  Pulmonary: Effort and breath sounds normal, no stridor, rhonchi, wheezes, rales.  Abdominal: Soft. BS +, no distension, tenderness, rebound or guarding.  Musculoskeletal: Normal range of motion. No edema and no tenderness.  Lymphadenopathy: No lymphadenopathy noted, cervical Neuro: Alert. Normal reflexes, muscle tone coordination. No cranial nerve deficit. Skin: Skin is warm and dry. No rash noted. Not diaphoretic. No erythema. No pallor. Psychiatric: Normal mood and affect. Behavior, judgment, thought content normal.  Lab Results  Component Value Date   WBC 3.3* 10/26/2013   HGB 13.9 10/26/2013   HCT 41.0 10/26/2013   MCV 86.5 10/26/2013   PLT 120* 10/26/2013   Lab Results  Component Value Date   CREATININE 0.70 10/26/2013   BUN 11 10/26/2013   NA 143 10/26/2013   K 3.7 10/26/2013   CL 104 10/26/2013   CO2 22 03/04/2013    Lab Results  Component Value Date   HGBA1C 6.4 11/04/2013   Lipid Panel  No results found for this basename: chol, trig, hdl, cholhdl, vldl, ldlcalc       Assessment and plan:   Brenda Cannon was seen today for establish  care.  Diagnoses and associated orders for this visit:  Encounter to establish care - TSH - Cancel: Hemoglobin A1c - Lipid panel  Diabetes - metFORMIN (GLUCOPHAGE) 500 MG tablet; Take 1 tablet (500 mg total) by mouth 2 (two) times daily with a meal. - Ambulatory referral to diabetic education Patient will make necessary dietary changes and lifestyle changes and return to clinic in 3 months for reevaluation. 10 minutes of education provided to patient about diabetes mellitus.  Kidney stone - Ambulatory referral to Urology Take kidney stone to urology visit for evaluation. Discontinue Flomax.  Irregular menses Likely due to history of Depo-Provera use, explained to patient could take up 12-18 months for medication to get out of her system. Will evaluate at next visit.  History of gestational diabetes - Glucose (CBG) - POCT glycosylated hemoglobin (Hb A1C)   Return in about 3 months (around 02/03/2014) for  irregular menses, Diabetes Mellitus.   Chari Manning, NP-C [facility] [phone] 11/04/2013, 2:48 PM

## 2013-11-04 NOTE — Patient Instructions (Addendum)
Exercise to Lose Weight Exercise and a healthy diet may help you lose weight. Your doctor may suggest specific exercises. EXERCISE IDEAS AND TIPS  Choose low-cost things you enjoy doing, such as walking, bicycling, or exercising to workout videos.  Take stairs instead of the elevator.  Walk during your lunch break.  Park your car further away from work or school.  Go to a gym or an exercise class.  Start with 5 to 10 minutes of exercise each day. Build up to 30 minutes of exercise 4 to 6 days a week.  Wear shoes with good support and comfortable clothes.  Stretch before and after working out.  Work out until you breathe harder and your heart beats faster.  Drink extra water when you exercise.  Do not do so much that you hurt yourself, feel dizzy, or get very short of breath. Exercises that burn about 150 calories:  Running 1  miles in 15 minutes.  Playing volleyball for 45 to 60 minutes.  Washing and waxing a car for 45 to 60 minutes.  Playing touch football for 45 minutes.  Walking 1  miles in 35 minutes.  Pushing a stroller 1  miles in 30 minutes.  Playing basketball for 30 minutes.  Raking leaves for 30 minutes.  Bicycling 5 miles in 30 minutes.  Walking 2 miles in 30 minutes.  Dancing for 30 minutes.  Shoveling snow for 15 minutes.  Swimming laps for 20 minutes.  Walking up stairs for 15 minutes.  Bicycling 4 miles in 15 minutes.  Gardening for 30 to 45 minutes.  Jumping rope for 15 minutes.  Washing windows or floors for 45 to 60 minutes. Document Released: 07/27/2010 Document Revised: 09/16/2011 Document Reviewed: 07/27/2010 [facility] Patient Information 2014 [place], [place]. DASH Diet The DASH diet stands for "Dietary Approaches to Stop Hypertension." It is a healthy eating plan that has been shown to reduce high blood pressure (hypertension) in as little as 14 days, while also possibly providing other significant health benefits. These other  health benefits include reducing the risk of breast cancer after menopause and reducing the risk of type 2 diabetes, heart disease, colon cancer, and stroke. Health benefits also include weight loss and slowing kidney failure in patients with chronic kidney disease.  DIET GUIDELINES  Limit salt (sodium). Your diet should contain less than 1500 mg of sodium daily.  Limit refined or processed carbohydrates. Your diet should include mostly whole grains. Desserts and added sugars should be used sparingly.  Include small amounts of heart-healthy fats. These types of fats include nuts, oils, and tub margarine. Limit saturated and trans fats. These fats have been shown to be harmful in the body. CHOOSING FOODS  The following food groups are based on a 2000 calorie diet. See your Registered Dietitian for individual calorie needs. Grains and Grain Products (6 to 8 servings daily)  Eat More Often: Whole-wheat bread, brown rice, whole-grain or wheat pasta, quinoa, popcorn without added fat or salt (air popped).  Eat Less Often: White bread, white pasta, white rice, cornbread. Vegetables (4 to 5 servings daily)  Eat More Often: Fresh, frozen, and canned vegetables. Vegetables may be raw, steamed, roasted, or grilled with a minimal amount of fat.  Eat Less Often/Avoid: Creamed or fried vegetables. Vegetables in a cheese sauce. Fruit (4 to 5 servings daily)  Eat More Often: All fresh, canned (in natural juice), or frozen fruits. Dried fruits without added sugar. One hundred percent fruit juice ( cup [237 mL] daily).  Eat Less Often: Dried fruits with added sugar. Canned fruit in light or heavy syrup. [employer], Fish, and Poultry (2 servings or less daily. One serving is 3 to 4 oz [85-114 g]).  Eat More Often: Ninety percent or leaner ground beef, tenderloin, sirloin. Round cuts of beef, chicken breast, [place] breast. All fish. Grill, bake, or broil your meat. Nothing should be fried.  Eat Less  Often/Avoid: Fatty cuts of meat, [place], or chicken leg, thigh, or wing. Fried cuts of meat or fish. Dairy (2 to 3 servings)  Eat More Often: Low-fat or fat-free milk, low-fat plain or light yogurt, reduced-fat or part-skim cheese.  Eat Less Often/Avoid: Milk (whole, 2%).Whole milk yogurt. Full-fat cheeses. Nuts, Seeds, and Legumes (4 to 5 servings per week)  Eat More Often: All without added salt.  Eat Less Often/Avoid: Salted nuts and seeds, canned beans with added salt. Fats and Sweets (limited)  Eat More Often: Vegetable oils, tub margarines without trans fats, sugar-free gelatin. Mayonnaise and salad dressings.  Eat Less Often/Avoid: Coconut oils, palm oils, butter, stick margarine, cream, half and half, cookies, candy, pie. FOR MORE INFORMATION The Dash Diet Eating Plan: www.dashdiet.org Document Released: 06/13/2011 Document Revised: 09/16/2011 Document Reviewed: 06/13/2011 [facility] Patient Information 2014 [place], [place]. Type 2 Diabetes Mellitus, Adult Type 2 diabetes mellitus is a long-term (chronic) disease. In type 2 diabetes:  The pancreas does not make enough of a hormone called insulin.  The cells in the body do not respond as well to the insulin that is made.  Both of the above can happen. Normally, insulin moves sugars from food into tissue cells. This gives you energy. If you have type 2 diabetes, sugars cannot be moved into tissue cells. This causes high blood sugar (hyperglycemia).  HOME CARE  Have your hemoglobin A1c level checked twice a year. The level shows if your diabetes is under control or out of control.  Perform daily blood sugar testing as told by your doctor.  Check your ketone levels by testing your pee (urine) when you are sick and as told.  Take your diabetes or insulin medicine as told by your doctor.  Never run out of insulin.  Adjust how much insulin you give yourself based on how many carbs (carbohydrates) you eat. Carbs are in many  foods, such as fruits, vegetables, whole grains, and dairy products.  Have a healthy snack between every healthy meal. Have 3 meals and 3 snacks a day.  Lose weight if you are overweight.  Carry a medical alert card or wear your medical alert jewelry.  Carry a 15 gram carb snack with you at all times. Examples include:  Glucose pills, 3 or 4.  Glucose gel, 15 gram tube.  Raisins, 2 tablespoons (24 grams).  Jelly beans, 6.  Animal crackers, 8.  Sugar pop, 4 ounces (120 milliliters).  Gummy treats, 9.  Notice low blood sugar (hypoglycemia) symptoms, such as:  Shaking (tremors).  Decreased ability to think clearly.  Sweating.  Increased heart rate.  Headache.  Dry mouth.  Hunger.  Crabbiness (irritability).  Being worried or tense (anxiety).  Restless sleep.  A change in speech or coordination.  Confusion.  Treat low blood sugar right away. If you are alert and can swallow, follow the 15:15 rule:  Take 15 20 grams of a rapid-acting glucose or carb. This includes glucose gel, glucose pills, or 4 ounces (120 milliliters) of fruit juice, regular pop, or low-fat milk.  Check your blood sugar level after taking the  glucose.  Take 15 20 grams of more glucose if the repeat blood sugar level is still 70 mg/dL (milligrams/deciliter) or below.  Eat a meal or snack within 1 hour of the blood sugar levels going back to normal.  Notice early symptoms of high blood sugar, such as:  Being really thirsty or drinking a lot (polydipsia).  Peeing (urinating) a lot (polyuria).  Do at least 150 minutes of physical activity a week or as told.  Split the 150 minutes of activity up during the week. Do not do 150 minutes of activity in one day.  Perform exercises, such as weight lifting, at least 2 times a week or as told.  Adjust your insulin or food intake as needed if you start a new exercise or sport.  Follow your sick day plan when you are not able to eat or drink  as usual.  Avoid tobacco use.  Women who are not pregnant should drink no more than 1 drink a day. Men should drink no more than 2 drinks a day.  Only drink alcohol with food.  Ask your doctor if alcohol is safe for you.  Tell your doctor if you drink alcohol several times during the week.  See your doctor regularly.  Schedule an eye exam soon after you are diagnosed with diabetes. Schedule exams once every year.  Check your skin and feet every day. Check for cuts, bruises, redness, nail problems, bleeding, blisters, or sores. A doctor should do a foot exam once a year.  Brush your teeth and gums twice a day. Floss once a day. Visit your dentist regularly.  Share your diabetes plan with your workplace or school.  Stay up-to-date with shots that fight against diseases (immunizations).  Learn how to manage stress.  Get diabetes education and support as needed.  Ask your doctor for special help if:  You need help to maintain or improve how you to do things on your own.  You need help to maintain or improve the quality of your life.  You have foot or hand problems.  You have trouble cleaning yourself, dressing, eating, or doing physical activity. GET HELP RIGHT AWAY IF:  You have trouble breathing.  You have moderate to large ketone levels.  You are unable to eat food or drink fluids for more than 6 hours.  You feel sick to your stomach (nauseous) or throw up (vomit) for more than 6 hours.  Your blood sugar level is over 240 mg/dL.  There is a change in mental status.  You get another serious illness.  You have watery poop (diarrhea) for more than 6 hours.  You have been sick or have had a fever for 2 or more days and are not getting better.  You have pain when you are physically active. MAKE SURE YOU:  Understand these instructions.  Will watch your condition.  Will get help right away if you are not doing well or get worse. Document Released: 04/02/2008  Document Revised: 04/14/2013 Document Reviewed: 10/23/2012 [facility] Patient Information 2014 [place], [place].

## 2013-11-04 NOTE — Progress Notes (Signed)
Here to establish care. Patient states she was seen in ED for kidney stone which she has since passed. Patient states she was seen for stress fracture top of left foot at same ED visit. Sustained foot injury at work approximately 4 weeks ago. Continues to wear boot on left foot. Has had no pain relief. Patient states she has not had regular menses since stopping Dep-Provera 1 year ago. Also C/O hot and cold flashes.

## 2013-11-05 ENCOUNTER — Telehealth: Payer: Self-pay | Admitting: *Deleted

## 2013-11-05 LAB — TSH: TSH: 0.788 u[IU]/mL (ref 0.350–4.500)

## 2013-11-05 NOTE — Telephone Encounter (Signed)
Left a message patient to call back regarding lab results. Alverda Skeans, RN

## 2013-11-05 NOTE — Telephone Encounter (Signed)
Message copied by Alverda Skeans on Fri Nov 05, 2013 12:10 PM ------      Message from: Brenda Cannon      Created: Fri Nov 05, 2013 10:32 AM       Let pt know thyroid and cholesterol came back normal. She was worried about the results. Thanks ------

## 2013-11-10 ENCOUNTER — Encounter: Payer: Self-pay | Admitting: Emergency Medicine

## 2013-11-10 ENCOUNTER — Ambulatory Visit (INDEPENDENT_AMBULATORY_CARE_PROVIDER_SITE_OTHER): Payer: No Typology Code available for payment source | Admitting: Emergency Medicine

## 2013-11-10 VITALS — BP 114/80 | Ht 61.0 in | Wt 180.0 lb

## 2013-11-10 DIAGNOSIS — M84376A Stress fracture, unspecified foot, initial encounter for fracture: Secondary | ICD-10-CM

## 2013-11-10 DIAGNOSIS — M84375A Stress fracture, left foot, initial encounter for fracture: Secondary | ICD-10-CM

## 2013-11-10 NOTE — Assessment & Plan Note (Signed)
Fall visit today demonstrate healing stress fracture. We discussed her return to work status. He believes that she will be able to return to work on May 20 wearing her steel toed work boots. I've given her a steel insert to put into the shoe to help decrease the mobility of it. In addition we recommended 4 weeks of no prolonged standing/walking at work. She'll follow up with me in 4-6 weeks.

## 2013-11-10 NOTE — Progress Notes (Signed)
Patient ID: Brenda Cannon, female   DOB: 23-Mar-1981, 34 y.o.   MRN: [id number] 33 year old female presents for followup of a left third metatarsal stress fracture. She was seen in urgent care diagnosed with this in April. Currently she presents wearing a postop shoe. She reports some continued pain however much improvement. No new injuries. She has not returned to work yet.  She anticipates returning to work on May 20. Her job requires her to wear steel toed boots. She may not return to work until she can stand in the sports.  Pertinent past medical history: Malaria, thrombocytopenia  Social history:  Nonsmoker no alcohol  Review of systems as per history of present illness otherwise negative  Examination:BP 114/80  Ht 5\' 1"  (1.549 m)  Wt 180 lb (81.647 kg)  BMI 34.03 kg/m2  LMP 06/27/2013 Well-developed well-nourished 33 year old [place] American female awake alert oriented no acute distress left foot:  Mild swelling over the dorsum of the midfoot. Tenderness to palpation over the third metatarsal bone. No ecchymosis. DP and PT pulses intact  Muscular skeletal ultrasound of the third metatarsal bone was performed. Callus formation was visualized the midportion of the third metatarsal bone. Seen in both longitudinal and transverse views. This consistent with healing metatarsal stress fracture.

## 2013-11-15 ENCOUNTER — Ambulatory Visit: Payer: No Typology Code available for payment source | Admitting: Sports Medicine

## 2013-11-24 NOTE — Progress Notes (Signed)
Lab results reviewed with patient in person on 11/08/13 at patient's mother's visit.

## 2013-11-24 NOTE — Progress Notes (Signed)
Lab results reviewed with patient in person on 11/08/13 at patient's mother's visit. 

## 2013-11-25 ENCOUNTER — Ambulatory Visit: Payer: No Typology Code available for payment source

## 2013-12-02 ENCOUNTER — Ambulatory Visit: Payer: No Typology Code available for payment source

## 2013-12-08 ENCOUNTER — Ambulatory Visit: Payer: No Typology Code available for payment source | Admitting: Emergency Medicine

## 2013-12-09 ENCOUNTER — Ambulatory Visit: Payer: No Typology Code available for payment source

## 2014-02-03 ENCOUNTER — Ambulatory Visit: Payer: No Typology Code available for payment source | Admitting: Internal Medicine

## 2014-04-21 ENCOUNTER — Encounter: Payer: Self-pay | Admitting: Internal Medicine

## 2014-04-21 ENCOUNTER — Ambulatory Visit: Payer: No Typology Code available for payment source | Attending: Internal Medicine | Admitting: Internal Medicine

## 2014-04-21 VITALS — BP 101/66 | HR 65 | Temp 98.3°F | Resp 18 | Ht 61.0 in | Wt 188.8 lb

## 2014-04-21 DIAGNOSIS — D696 Thrombocytopenia, unspecified: Secondary | ICD-10-CM

## 2014-04-21 DIAGNOSIS — E669 Obesity, unspecified: Secondary | ICD-10-CM | POA: Insufficient documentation

## 2014-04-21 DIAGNOSIS — R531 Weakness: Secondary | ICD-10-CM

## 2014-04-21 DIAGNOSIS — R7309 Other abnormal glucose: Secondary | ICD-10-CM

## 2014-04-21 DIAGNOSIS — Z23 Encounter for immunization: Secondary | ICD-10-CM

## 2014-04-21 DIAGNOSIS — R7303 Prediabetes: Secondary | ICD-10-CM

## 2014-04-21 LAB — CBC WITH DIFFERENTIAL/PLATELET
BASOS ABS: 0 10*3/uL (ref 0.0–0.1)
Basophils Relative: 0 % (ref 0–1)
Eosinophils Absolute: 0 10*3/uL (ref 0.0–0.7)
Eosinophils Relative: 1 % (ref 0–5)
HCT: 39.8 % (ref 36.0–46.0)
Hemoglobin: 13.6 g/dL (ref 12.0–15.0)
LYMPHS ABS: 1.6 10*3/uL (ref 0.7–4.0)
LYMPHS PCT: 51 % — AB (ref 12–46)
MCH: 29.6 pg (ref 26.0–34.0)
MCHC: 34.2 g/dL (ref 30.0–36.0)
MCV: 86.5 fL (ref 78.0–100.0)
MONO ABS: 0.3 10*3/uL (ref 0.1–1.0)
Monocytes Relative: 9 % (ref 3–12)
NEUTROS ABS: 1.2 10*3/uL — AB (ref 1.7–7.7)
Neutrophils Relative %: 39 % — ABNORMAL LOW (ref 43–77)
Platelets: 124 10*3/uL — ABNORMAL LOW (ref 150–400)
RBC: 4.6 MIL/uL (ref 3.87–5.11)
RDW: 13.8 % (ref 11.5–15.5)
WBC: 3.1 10*3/uL — AB (ref 4.0–10.5)

## 2014-04-21 LAB — POCT GLYCOSYLATED HEMOGLOBIN (HGB A1C): Hemoglobin A1C: 6.1

## 2014-04-21 LAB — FERRITIN: FERRITIN: 178 ng/mL (ref 10–291)

## 2014-04-21 LAB — GLUCOSE, POCT (MANUAL RESULT ENTRY): POC GLUCOSE: 130 mg/dL — AB (ref 70–99)

## 2014-04-21 LAB — POCT URINE PREGNANCY: Preg Test, Ur: NEGATIVE

## 2014-04-21 NOTE — Progress Notes (Signed)
Patient ID: Brenda Cannon, female   DOB: 12-Jul-1980, 33 y.o.   MRN: [id number]  CC:  weakness  HPI:  Patient reports that last week she felt really tired and weak.  She states that she often gets cold and weak in the middle of the night and then later in the day she has hot flashes.  She states that she has not had a menstrual cycle in two months.  She would like repeat blood work for anemia.  Patient reports that she only took the metformin for three days and stopped because it caused stomach discomfort.  She reports that she has tried exercising and changing her diet to make up for not taking the metformin.    Allergies  Allergen Reactions  . Chloroquine Hives  . Cephalexin Itching and Rash   Past Medical History  Diagnosis Date  . Gestational diabetes     GDM with last pregnancy (2008)  . History of hepatitis B 2007  . History of malaria 2003  . Difficulty reading   . History of gestational diabetes   . Obese   . History of thrombocytopenia   . Abnormal Pap smear     ascus with High Risk for HPV  . De Quervain's tenosynovitis, left    Current Outpatient Prescriptions on File Prior to Visit  Medication Sig Dispense Refill  . acetaminophen (TYLENOL) 325 MG tablet Take 650 mg by mouth every 6 (six) hours as needed for mild pain, fever or headache.       . metFORMIN (GLUCOPHAGE) 500 MG tablet Take 1 tablet (500 mg total) by mouth 2 (two) times daily with a meal.  60 tablet  3  . ondansetron (ZOFRAN) 8 MG tablet Take 1 tablet (8 mg total) by mouth every 8 (eight) hours as needed for nausea or vomiting.  20 tablet  0  . oxyCODONE-acetaminophen (PERCOCET) 5-325 MG per tablet Take 1 tablet by mouth every 4 (four) hours as needed for severe pain.  20 tablet  0  . sulfamethoxazole-trimethoprim (SEPTRA DS) 800-160 MG per tablet Take 1 tablet by mouth every 12 (twelve) hours.  10 tablet  0   No current facility-administered medications on file prior to visit.   Family History  Problem  Relation Age of Onset  . Hypertension Mother   . Stroke Mother    History   Social History  . Marital Status: Single    Spouse Name: N/A    Number of Children: N/A  . Years of Education: N/A   Occupational History  . Not on file.   Social History Main Topics  . Smoking status: Never Smoker   . Smokeless tobacco: Not on file  . Alcohol Use: No  . Drug Use: No  . Sexual Activity: Not on file   Other Topics Concern  . Not on file   Social History Narrative   ** Merged History Encounter **        Review of Systems: Constitutional: + weakness and fatigue HENT: Negative for ear pain, nosebleeds, congestion, facial swelling, rhinorrhea, neck pain, neck stiffness and ear discharge.  Eyes: Negative for pain, discharge, redness, itching and visual disturbance. Respiratory: Negative for cough, choking, chest tightness, shortness of breath, wheezing and stridor.  Cardiovascular: Negative for chest pain, palpitations and leg swelling. Gastrointestinal: Negative for abdominal distention. Genitourinary: Negative for dysuria, urgency, frequency, hematuria, flank pain, decreased urine volume, difficulty urinating and dyspareunia.  Musculoskeletal: Negative for back pain, joint swelling, arthralgias and gait problem. Neurological: Negative  for dizziness, tremors, seizures, syncope, facial asymmetry, speech difficulty, weakness, light-headedness, numbness and headaches.  Hematological: Negative for adenopathy. Does not bruise/bleed easily. Psychiatric/Behavioral: Negative for hallucinations, behavioral problems, confusion, dysphoric mood, decreased concentration and agitation.    Objective:   Filed Vitals:   04/21/14 1027  BP: 101/66  Pulse: 65  Temp: 98.3 F (36.8 C)  Resp: 18    Physical Exam  Cardiovascular: Normal rate, regular rhythm and normal heart sounds.   Pulmonary/Chest: Effort normal and breath sounds normal.  Abdominal: Soft. Bowel sounds are normal.   Musculoskeletal: Normal range of motion.  Skin: Skin is warm and dry.  Psychiatric: She has a normal mood and affect.     Lab Results  Component Value Date   WBC 3.3* 10/26/2013   HGB 13.9 10/26/2013   HCT 41.0 10/26/2013   MCV 86.5 10/26/2013   PLT 120* 10/26/2013   Lab Results  Component Value Date   CREATININE 0.70 10/26/2013   BUN 11 10/26/2013   NA 143 10/26/2013   K 3.7 10/26/2013   CL 104 10/26/2013   CO2 22 03/04/2013    Lab Results  Component Value Date   HGBA1C 6.4 11/04/2013   Lipid Panel     Component Value Date/Time   CHOL 153 11/04/2013 1207   TRIG 56 11/04/2013 1207   HDL 48 11/04/2013 1207   CHOLHDL 3.2 11/04/2013 1207   VLDL 11 11/04/2013 1207   LDLCALC 94 11/04/2013 1207       Assessment and plan:   Brenda Cannon was seen today for follow-up.  Diagnoses and associated orders for this visit:  Weakness - CBC with Differential - Ferritin - POCT urine pregnancy  Prediabetes - POCT glycosylated hemoglobin (Hb A1C) - POCT glucose (manual entry)  Thrombocytopenia WIll monitor and call with results  Need for prophylactic vaccination and inoculation against influenza - Flu Vaccine QUAD 36+ mos PF IM (Fluarix Quad PF)    Return in about 3 months (around 07/22/2014) for DM/HTN.       Chari Manning, NP-C [facility] [phone] 04/24/2014, 8:33 PM

## 2014-04-21 NOTE — Patient Instructions (Signed)
Exercise to Lose Weight Exercise and a healthy diet may help you lose weight. Your doctor may suggest specific exercises. EXERCISE IDEAS AND TIPS  Choose low-cost things you enjoy doing, such as walking, bicycling, or exercising to workout videos.  Take stairs instead of the elevator.  Walk during your lunch break.  Park your car further away from work or school.  Go to a gym or an exercise class.  Start with 5 to 10 minutes of exercise each day. Build up to 30 minutes of exercise 4 to 6 days a week.  Wear shoes with good support and comfortable clothes.  Stretch before and after working out.  Work out until you breathe harder and your heart beats faster.  Drink extra water when you exercise.  Do not do so much that you hurt yourself, feel dizzy, or get very short of breath. Exercises that burn about 150 calories:  Running 1  miles in 15 minutes.  Playing volleyball for 45 to 60 minutes.  Washing and waxing a car for 45 to 60 minutes.  Playing touch football for 45 minutes.  Walking 1  miles in 35 minutes.  Pushing a stroller 1  miles in 30 minutes.  Playing basketball for 30 minutes.  Raking leaves for 30 minutes.  Bicycling 5 miles in 30 minutes.  Walking 2 miles in 30 minutes.  Dancing for 30 minutes.  Shoveling snow for 15 minutes.  Swimming laps for 20 minutes.  Walking up stairs for 15 minutes.  Bicycling 4 miles in 15 minutes.  Gardening for 30 to 45 minutes.  Jumping rope for 15 minutes.  Washing windows or floors for 45 to 60 minutes. Document Released: 07/27/2010 Document Revised: 09/16/2011 Document Reviewed: 07/27/2010 [employer] Patient Information 2015 [employer]. This information is not intended to replace advice given to you by your health care provider. Make sure you discuss any questions you have with your health care provider.  

## 2014-04-21 NOTE — Progress Notes (Signed)
Patient presents for 2 days last week feeling weak, alternating chills and feeling warm States feels well today

## 2014-05-09 ENCOUNTER — Encounter: Payer: Self-pay | Admitting: Internal Medicine

## 2014-05-13 ENCOUNTER — Ambulatory Visit: Payer: Self-pay

## 2014-10-07 ENCOUNTER — Emergency Department (HOSPITAL_COMMUNITY)
Admission: EM | Admit: 2014-10-07 | Discharge: 2014-10-07 | Discharge status: Against Medical Advice | Payer: Self-pay | Attending: Emergency Medicine | Admitting: Emergency Medicine

## 2014-10-07 ENCOUNTER — Encounter (HOSPITAL_COMMUNITY): Payer: Self-pay | Admitting: *Deleted

## 2014-10-07 DIAGNOSIS — R51 Headache: Secondary | ICD-10-CM | POA: Insufficient documentation

## 2014-10-07 DIAGNOSIS — E669 Obesity, unspecified: Secondary | ICD-10-CM | POA: Insufficient documentation

## 2014-10-07 DIAGNOSIS — R079 Chest pain, unspecified: Secondary | ICD-10-CM | POA: Insufficient documentation

## 2014-10-07 NOTE — ED Notes (Addendum)
Patient reports chest pain x one day with sob and frontal headache. Pt reports taking ibuprofen with no relief. Patient in no distress.

## 2014-10-07 NOTE — ED Notes (Signed)
Called pt three times to reassess vitals. No answer

## 2014-11-15 ENCOUNTER — Ambulatory Visit: Payer: Self-pay

## 2015-03-07 ENCOUNTER — Encounter (HOSPITAL_COMMUNITY): Payer: Self-pay | Admitting: Emergency Medicine

## 2015-03-07 ENCOUNTER — Emergency Department (INDEPENDENT_AMBULATORY_CARE_PROVIDER_SITE_OTHER)
Admission: EM | Admit: 2015-03-07 | Discharge: 2015-03-07 | Disposition: A | Discharge status: Home / Self Care | Payer: Self-pay | Source: Home / Self Care | Attending: Emergency Medicine | Admitting: Emergency Medicine

## 2015-03-07 DIAGNOSIS — G44209 Tension-type headache, unspecified, not intractable: Secondary | ICD-10-CM

## 2015-03-07 MED ORDER — KETOROLAC TROMETHAMINE 60 MG/2ML IM SOLN
INTRAMUSCULAR | Status: AC
  Filled 2015-03-07: qty 2

## 2015-03-07 MED ORDER — KETOROLAC TROMETHAMINE 60 MG/2ML IM SOLN
60.0000 mg | Freq: Once | INTRAMUSCULAR | Status: AC
  Administered 2015-03-07: 60 mg via INTRAMUSCULAR

## 2015-03-07 MED ORDER — NAPROXEN 500 MG PO TABS: 500.0000 mg | ORAL_TABLET | Freq: Two times a day (BID) | ORAL | Status: DC

## 2015-03-07 MED ORDER — BUTALBITAL-APAP-CAFFEINE 50-325-40 MG PO TABS: 1.0000 | ORAL_TABLET | Freq: Four times a day (QID) | ORAL | Status: DC | PRN

## 2015-03-07 NOTE — ED Notes (Signed)
C/o constant HA onset 1 week associated w/chills and watery eyes Pain is 8/10 Denies n/v/d Alert.. No acute distress.

## 2015-03-07 NOTE — Discharge Instructions (Signed)
Go to the ER for the signs and symptoms we discussed. This may take several days to get better, but it should be getting better, not worse. Follow-up with your doctor.

## 2015-03-07 NOTE — ED Provider Notes (Signed)
HPI  SUBJECTIVE:  Brenda Cannon is a 34 y.o. female who presents with Pt c/o gradual onset throbbing diffuse headache over the back of her neck, wrapping around the front of her head like a band starting approximately week ago. States it is intermittent, daily, lasts hours. Reports bilateral eyes watering when headache is especially severe, but denies any allergy type symptoms. Symptoms are worse with stress, better with Aleve. She also tried Tylenol for this.  Headache is not associated with reading. No N/V, photophobia, phonophobia, visual changes, nasal congestion, sinus pain/pressure, ear pain,  purulent nasal d/c, dental pain, neck stiffness, rash, dysarthria, focal weakness, facial droop. No mental status changes, seizures, syncope. Pt is not pregnant. No h/o HIV. No sudden onset, did not occur during exertion. States is similar to previous HA, but is not resolving. Patient has a history of gestational diabetes, resolved. No history of hypertension, sinus infections, aneurysm, strokes, migraines.     Past Medical History  Diagnosis Date  . Gestational diabetes     GDM with last pregnancy (2008)  . History of hepatitis B 2007  . History of malaria 2003  . Difficulty reading   . History of gestational diabetes   . Obese   . History of thrombocytopenia   . Abnormal Pap smear     ascus with High Risk for HPV  . De Quervain's tenosynovitis, left     Past Surgical History  Procedure Laterality Date  . Cesarean section  2008    last pregnacy  . Cesarean section  08/21/2011    Procedure: CESAREAN SECTION;  Surgeon: Donnamae Jude, MD;  Location: [facility];  Service: Gynecology;  Laterality: N/A;    Family History  Problem Relation Age of Onset  . Hypertension Mother   . Stroke Mother     Social History  Substance Use Topics  . Smoking status: Never Smoker   . Smokeless tobacco: None  . Alcohol Use: No    No current facility-administered medications for this  encounter.  Current outpatient prescriptions:  .  acetaminophen (TYLENOL) 325 MG tablet, Take 650 mg by mouth every 6 (six) hours as needed for mild pain, fever or headache. , Disp: , Rfl:  .  butalbital-acetaminophen-caffeine (FIORICET) 50-325-40 MG per tablet, Take 1-2 tablets by mouth every 6 (six) hours as needed for headache (max 6 caps/day)., Disp: 20 tablet, Rfl: 0 .  metFORMIN (GLUCOPHAGE) 500 MG tablet, Take 1 tablet (500 mg total) by mouth 2 (two) times daily with a meal., Disp: 60 tablet, Rfl: 3 .  naproxen (NAPROSYN) 500 MG tablet, Take 1 tablet (500 mg total) by mouth 2 (two) times daily., Disp: 20 tablet, Rfl: 0 .  ondansetron (ZOFRAN) 8 MG tablet, Take 1 tablet (8 mg total) by mouth every 8 (eight) hours as needed for nausea or vomiting., Disp: 20 tablet, Rfl: 0  Allergies  Allergen Reactions  . Chloroquine Hives  . Cephalexin Itching and Rash     ROS  As noted in HPI.   Physical Exam  BP 124/80 mmHg  Pulse 71  Temp(Src) 99 F (37.2 C) (Oral)  Resp 20  SpO2 98%  LMP 02/23/2015  Constitutional: Well developed, well nourished, no acute distress. No photophobia Eyes: PERRL, EOMI, conjunctiva normal bilaterally. Funduscopic normal bilaterally HENT: Normocephalic, atraumatic,mucus membranes moist.  mild left-sided trapezial tenderness, muscle spasm. Diffuse tenderness over her entire head. TMs normal bilaterally, external ear canal normal bilaterally. no temporal artery tenderness, no sinus tenderness, no nasal congestion, no purulent  nasal drainage. Normal dentition. Mild tenderness over left TMJ, no crepitus, popping, clicking with ROM jaw. No meningismus, neck stiffness. Respiratory: Clear to auscultation bilaterally, no rales, no wheezing, no rhonchi Cardiovascular: Normal rate and rhythm, no murmurs, no gallops, no rubs GI: Soft, nondistended, normal bowel sounds, nontender, no rebound, no guarding Back: no CVAT skin: No rash, skin intact Musculoskeletal: No  edema, no tenderness, no deformities Lymph: No cervical lymphadenopathy Neurologic: Alert & oriented x 3, CN II-XII  intact, no motor deficits, sensation grossly intact, Romberg negative. tandem gait steady, finger nose heel shin within normal limits Psychiatric: Speech and behavior appropriate   ED Course   Medications  ketorolac (TORADOL) injection 60 mg (60 mg Intramuscular Given 03/07/15 1813)    No orders of the defined types were placed in this encounter.   No results found for this or any previous visit (from the past 24 hour(s)). No results found.  ED Clinical Impression  Tension-type headache, not intractable, unspecified chronicity pattern   ED Assessment/Plan  Pt describing typical pain, no sudden onset. Doubt SAH, ICH or space occupying lesion. Pt without fevers/chills, Pt has no meningeal sx, no nuchal rigidity. Doubt meningitis. Pt with normal neuro exam, no evidence of CVA/TIA.  Pt BP not elevated significantly, doubt hypertensive emergency. No evidence of temporal artery tenderness, no evidence of glaucoma or other occular pathology. Does not appear to be cluster headache. presentation most consistent with a tension-type headache although she does have some tenderness over the left TMJ there is no clicking or crepitus at TMJ. She is a neurologically intact. Giving shot of Toradol here,   Will d/c home with nsaid, fioricet, and have pt F/U with PCP. Discussed MDM, plan for follow up, signs and sx that should prompt return to ER. Pt agrees with plan.     *This clinic note was created using Dragon dictation software. Therefore, there may be occasional mistakes despite careful proofreading.  ?  Melynda Ripple, MD 03/07/15 [phone]

## 2015-03-10 ENCOUNTER — Emergency Department (HOSPITAL_COMMUNITY): Payer: Medicaid Other

## 2015-03-10 ENCOUNTER — Emergency Department (HOSPITAL_COMMUNITY)
Admission: EM | Admit: 2015-03-10 | Discharge: 2015-03-10 | Disposition: A | Discharge status: Home / Self Care | Payer: Self-pay | Attending: Emergency Medicine | Admitting: Emergency Medicine

## 2015-03-10 ENCOUNTER — Encounter (HOSPITAL_COMMUNITY): Payer: Self-pay | Admitting: *Deleted

## 2015-03-10 DIAGNOSIS — Z8739 Personal history of other diseases of the musculoskeletal system and connective tissue: Secondary | ICD-10-CM | POA: Insufficient documentation

## 2015-03-10 DIAGNOSIS — Z79899 Other long term (current) drug therapy: Secondary | ICD-10-CM | POA: Insufficient documentation

## 2015-03-10 DIAGNOSIS — R112 Nausea with vomiting, unspecified: Secondary | ICD-10-CM | POA: Insufficient documentation

## 2015-03-10 DIAGNOSIS — E669 Obesity, unspecified: Secondary | ICD-10-CM | POA: Insufficient documentation

## 2015-03-10 DIAGNOSIS — Z8619 Personal history of other infectious and parasitic diseases: Secondary | ICD-10-CM | POA: Insufficient documentation

## 2015-03-10 DIAGNOSIS — R109 Unspecified abdominal pain: Secondary | ICD-10-CM | POA: Insufficient documentation

## 2015-03-10 DIAGNOSIS — R319 Hematuria, unspecified: Secondary | ICD-10-CM | POA: Insufficient documentation

## 2015-03-10 DIAGNOSIS — Z862 Personal history of diseases of the blood and blood-forming organs and certain disorders involving the immune mechanism: Secondary | ICD-10-CM | POA: Insufficient documentation

## 2015-03-10 DIAGNOSIS — Z8632 Personal history of gestational diabetes: Secondary | ICD-10-CM | POA: Insufficient documentation

## 2015-03-10 LAB — COMPREHENSIVE METABOLIC PANEL
ALBUMIN: 3 g/dL — AB (ref 3.5–5.0)
ALK PHOS: 49 U/L (ref 38–126)
ALT: 37 U/L (ref 14–54)
AST: 45 U/L — AB (ref 15–41)
Anion gap: 5 (ref 5–15)
BILIRUBIN TOTAL: 1.1 mg/dL (ref 0.3–1.2)
BUN: 16 mg/dL (ref 6–20)
CALCIUM: 8.8 mg/dL — AB (ref 8.9–10.3)
CO2: 24 mmol/L (ref 22–32)
Chloride: 107 mmol/L (ref 101–111)
Creatinine, Ser: 1.74 mg/dL — ABNORMAL HIGH (ref 0.44–1.00)
GFR calc Af Amer: 43 mL/min — ABNORMAL LOW (ref >60)
GFR calc non Af Amer: 37 mL/min — ABNORMAL LOW (ref >60)
GLUCOSE: 153 mg/dL — AB (ref 65–99)
Potassium: 3.4 mmol/L — ABNORMAL LOW (ref 3.5–5.1)
Sodium: 136 mmol/L (ref 135–145)
TOTAL PROTEIN: 6.8 g/dL (ref 6.5–8.1)

## 2015-03-10 LAB — URINE MICROSCOPIC-ADD ON

## 2015-03-10 LAB — URINALYSIS, ROUTINE W REFLEX MICROSCOPIC
BILIRUBIN URINE: NEGATIVE
Glucose, UA: 100 mg/dL — AB
Ketones, ur: NEGATIVE mg/dL
Leukocytes, UA: NEGATIVE
Nitrite: NEGATIVE
PROTEIN: NEGATIVE mg/dL
SPECIFIC GRAVITY, URINE: 1.009 (ref 1.005–1.030)
UROBILINOGEN UA: 1 mg/dL (ref 0.0–1.0)
pH: 6 (ref 5.0–8.0)

## 2015-03-10 LAB — CBC
HCT: 38.8 % (ref 36.0–46.0)
Hemoglobin: 12.9 g/dL (ref 12.0–15.0)
MCH: 29.9 pg (ref 26.0–34.0)
MCHC: 33.2 g/dL (ref 30.0–36.0)
MCV: 90 fL (ref 78.0–100.0)
Platelets: 92 10*3/uL — ABNORMAL LOW (ref 150–400)
RBC: 4.31 MIL/uL (ref 3.87–5.11)
RDW: 13.1 % (ref 11.5–15.5)
WBC: 3.9 10*3/uL — ABNORMAL LOW (ref 4.0–10.5)

## 2015-03-10 LAB — I-STAT BETA HCG BLOOD, ED (MC, WL, AP ONLY)

## 2015-03-10 LAB — LIPASE, BLOOD: Lipase: 29 U/L (ref 22–51)

## 2015-03-10 MED ORDER — ONDANSETRON 4 MG PO TBDP
ORAL_TABLET | ORAL | Status: AC
  Filled 2015-03-10: qty 1

## 2015-03-10 MED ORDER — ONDANSETRON 4 MG PO TBDP
4.0000 mg | ORAL_TABLET | Freq: Once | ORAL | Status: AC | PRN
  Administered 2015-03-10: 4 mg via ORAL

## 2015-03-10 MED ORDER — PANTOPRAZOLE SODIUM 40 MG PO TBEC
40.0000 mg | DELAYED_RELEASE_TABLET | Freq: Every day | ORAL | Status: DC
  Administered 2015-03-10: 40 mg via ORAL
  Filled 2015-03-10: qty 1

## 2015-03-10 MED ORDER — SODIUM CHLORIDE 0.9 % IV BOLUS (SEPSIS)
1000.0000 mL | Freq: Once | INTRAVENOUS | Status: AC
  Administered 2015-03-10: 1000 mL via INTRAVENOUS

## 2015-03-10 MED ORDER — OXYCODONE-ACETAMINOPHEN 5-325 MG PO TABS
1.0000 | ORAL_TABLET | Freq: Once | ORAL | Status: AC
  Administered 2015-03-10: 1 via ORAL
  Filled 2015-03-10: qty 1

## 2015-03-10 MED ORDER — OMEPRAZOLE 20 MG PO CPDR: 20.0000 mg | DELAYED_RELEASE_CAPSULE | Freq: Every day | ORAL | Status: DC

## 2015-03-10 NOTE — ED Provider Notes (Signed)
Pt has had bilateral pain in the abdomen - RQ and LUQ and SP - has had some subjective fevers and chills - on exam has mild ttp but no guarding - very soft - lungs and heart normal - labs unremarkable other than renal function - needs CT to r/o kidney stone - pt otherwise appears unremarkable.  Medical screening examination/treatment/procedure(s) were conducted as a shared visit with non-physician practitioner(s) and myself.  I personally evaluated the patient during the encounter.  Clinical Impression:   Final diagnoses:  Flank pain  Hematuria  Abdominal pain, unspecified abdominal location         Noemi Chapel, MD 03/11/15 1705

## 2015-03-10 NOTE — Discharge Instructions (Signed)
Your kidney function was a little elevated here today.  Recommend to stop taking NSAIDs. Follow-up with your primary care physician.  You need to have your kidney function re-checked in 1 week. Return to the ED for new or worsening symptoms.

## 2015-03-10 NOTE — ED Notes (Signed)
Pt reports constant abd pain since yesterday and also having n/v, pain with urination.  Denies diarrhea.

## 2015-03-10 NOTE — ED Provider Notes (Signed)
CSN: [id number]     Arrival date & time 03/10/15  1242 History   First MD Initiated Contact with Patient 03/10/15 1349     Chief Complaint  Patient presents with  . Abdominal Pain  . Emesis     (Consider location/radiation/quality/duration/timing/severity/associated sxs/prior Treatment) Patient is a 34 y.o. female presenting with abdominal pain and vomiting. The history is provided by the patient and medical records.  Abdominal Pain Associated symptoms: dysuria, nausea and vomiting   Emesis Associated symptoms: abdominal pain     This is a 34 year old female with history of obesity, diabetes, presenting to the ED for abdominal pain. Patient states last night she began experiencing lower abdominal pain and dysuria. States she feels the need to urinate frequently, however only urinates small amounts at the time.  She also reports some nausea and one episode of nonbloody, nonbilious emesis this morning. Patient denies any flank pain, back pain, fever, or chills. No vaginal complaints.  Patient states she had a bowel movement last night and this morning, no melena or hematochezia.  Prior abdominal surgeries only include c-section x2.  VSS.  Past Medical History  Diagnosis Date  . Gestational diabetes     GDM with last pregnancy (2008)  . History of hepatitis B 2007  . History of malaria 2003  . Difficulty reading   . History of gestational diabetes   . Obese   . History of thrombocytopenia   . Abnormal Pap smear     ascus with High Risk for HPV  . De Quervain's tenosynovitis, left    Past Surgical History  Procedure Laterality Date  . Cesarean section  2008    last pregnacy  . Cesarean section  08/21/2011    Procedure: CESAREAN SECTION;  Surgeon: Donnamae Jude, MD;  Location: [facility];  Service: Gynecology;  Laterality: N/A;   Family History  Problem Relation Age of Onset  . Hypertension Mother   . Stroke Mother    Social History  Substance Use Topics  . Smoking status: Never  Smoker   . Smokeless tobacco: None  . Alcohol Use: No   OB History    Gravida Para Term Preterm AB TAB SAB Ectopic Multiple Living   5 3 3  0 2  1 0 0 3     Review of Systems  Gastrointestinal: Positive for nausea, vomiting and abdominal pain.  Genitourinary: Positive for dysuria.  All other systems reviewed and are negative.     Allergies  Chloroquine and Cephalexin  Home Medications   Prior to Admission medications   Medication Sig Start Date End Date Taking? Authorizing Provider  acetaminophen (TYLENOL) 325 MG tablet Take 650 mg by mouth every 6 (six) hours as needed for mild pain, fever or headache.     Historical Provider, MD  butalbital-acetaminophen-caffeine (FIORICET) 50-325-40 MG per tablet Take 1-2 tablets by mouth every 6 (six) hours as needed for headache (max 6 caps/day). 03/07/15   Melynda Ripple, MD  metFORMIN (GLUCOPHAGE) 500 MG tablet Take 1 tablet (500 mg total) by mouth 2 (two) times daily with a meal. 11/04/13   Lance Bosch, NP  naproxen (NAPROSYN) 500 MG tablet Take 1 tablet (500 mg total) by mouth 2 (two) times daily. 03/07/15   Melynda Ripple, MD  ondansetron (ZOFRAN) 8 MG tablet Take 1 tablet (8 mg total) by mouth every 8 (eight) hours as needed for nausea or vomiting. 10/27/13   Daleen Bo, MD   BP 121/85 mmHg  Pulse 51  Temp(Src)  98.3 F (36.8 C) (Oral)  Resp 18  Ht 5\' 1"  (1.549 m)  Wt 180 lb (81.647 kg)  BMI 34.03 kg/m2  SpO2 99%  LMP 02/23/2015   Physical Exam  Constitutional: She is oriented to person, place, and time. She appears well-developed and well-nourished.  HENT:  Head: Normocephalic and atraumatic.  Mouth/Throat: Oropharynx is clear and moist.  Eyes: Conjunctivae and EOM are normal. Pupils are equal, round, and reactive to light.  Neck: Normal range of motion.  Cardiovascular: Normal rate, regular rhythm and normal heart sounds.   Pulmonary/Chest: Effort normal and breath sounds normal.  Abdominal: Soft. Bowel sounds are  normal. There is no tenderness. There is no rigidity, no guarding and no CVA tenderness.  Obese abdomen, soft; endorses pain along the lower abdomen, no focal tenderness  Musculoskeletal: Normal range of motion.  Neurological: She is alert and oriented to person, place, and time.  Skin: Skin is warm and dry.  Psychiatric: She has a normal mood and affect.  Nursing note and vitals reviewed.   ED Course  Procedures (including critical care time) Labs Review Labs Reviewed  COMPREHENSIVE METABOLIC PANEL - Abnormal; Notable for the following:    Potassium 3.4 (*)    Glucose, Bld 153 (*)    Creatinine, Ser 1.74 (*)    Calcium 8.8 (*)    Albumin 3.0 (*)    AST 45 (*)    GFR calc non Af Amer 37 (*)    GFR calc Af Amer 43 (*)    All other components within normal limits  CBC - Abnormal; Notable for the following:    WBC 3.9 (*)    Platelets 92 (*)    All other components within normal limits  URINALYSIS, ROUTINE W REFLEX MICROSCOPIC (NOT AT [facility]) - Abnormal; Notable for the following:    Glucose, UA 100 (*)    Hgb urine dipstick TRACE (*)    All other components within normal limits  LIPASE, BLOOD  URINE MICROSCOPIC-ADD ON  I-STAT BETA HCG BLOOD, ED ([facility], [facility], [facility] ONLY)    Imaging Review No results found. I have personally reviewed and evaluated these images and lab results as part of my medical decision-making.   EKG Interpretation None      MDM   Final diagnoses:  Flank pain  Hematuria  Abdominal pain, unspecified abdominal location   34 year old female here with lower abdominal pain and dysuria. Patient is afebrile, nontoxic. She endorses generalized pain of her lower abdomen but no focal tenderness on exam. No CVA tenderness. Lab work as above-- SrCr elevated when compared with prior values, 1.74 today compared to 0.70 June 2015.  BUN WNL.  Patient was recently started on NSAIDs at urgent care.  U/a with blood noted, non-infectious.  Will obtain CT renal study to evaluate  for potential kidney stone.  Patient given IVF bolus.  4:01 PM CT pending.  Care signed out to Dr. Doy Mince who will follow results of scan.  Regardless if stone present or not, feel patient is safe for discharge.  I have recommended that she stop NSAIDs and follow-up with PCP for re-check of renal function within the next week.  If acute stone present, patient will need follow-up with urology as well.  Case discussed with attending physician, Dr. Sabra Heck, who evaluated patient agrees with assessment and plan of care.  Larene Pickett, PA-C 03/10/15 1604  Noemi Chapel, MD 03/11/15 [phone]

## 2015-03-10 NOTE — ED Provider Notes (Signed)
Care assumed from Dr. Sabra Heck.  CT was negative.  Abdomen soft, minimally tender in LUQ and epigastrium, no peritoneal signs.  Could be gastritis from her NSAID use.  Advised she DC this, start prilosec, and follow up with PCP within a week for recheck including creatinine.   Clinical Impression: 1. Flank pain   2. Hematuria   3. Abdominal pain, unspecified abdominal location       Serita Grit, MD 03/10/15 5170

## 2015-03-11 ENCOUNTER — Emergency Department (HOSPITAL_COMMUNITY)
Admission: EM | Admit: 2015-03-11 | Discharge: 2015-03-12 | Disposition: A | Discharge status: Home / Self Care | Payer: Medicaid Other | Attending: Emergency Medicine | Admitting: Emergency Medicine

## 2015-03-11 ENCOUNTER — Encounter (HOSPITAL_COMMUNITY): Payer: Self-pay | Admitting: Emergency Medicine

## 2015-03-11 DIAGNOSIS — Z862 Personal history of diseases of the blood and blood-forming organs and certain disorders involving the immune mechanism: Secondary | ICD-10-CM | POA: Insufficient documentation

## 2015-03-11 DIAGNOSIS — E669 Obesity, unspecified: Secondary | ICD-10-CM | POA: Insufficient documentation

## 2015-03-11 DIAGNOSIS — Z8632 Personal history of gestational diabetes: Secondary | ICD-10-CM | POA: Insufficient documentation

## 2015-03-11 DIAGNOSIS — Z8739 Personal history of other diseases of the musculoskeletal system and connective tissue: Secondary | ICD-10-CM | POA: Insufficient documentation

## 2015-03-11 DIAGNOSIS — Z8619 Personal history of other infectious and parasitic diseases: Secondary | ICD-10-CM | POA: Insufficient documentation

## 2015-03-11 DIAGNOSIS — R1084 Generalized abdominal pain: Secondary | ICD-10-CM

## 2015-03-11 LAB — CBC
HCT: 38.6 % (ref 36.0–46.0)
Hemoglobin: 13 g/dL (ref 12.0–15.0)
MCH: 30.3 pg (ref 26.0–34.0)
MCHC: 33.7 g/dL (ref 30.0–36.0)
MCV: 90 fL (ref 78.0–100.0)
PLATELETS: 94 10*3/uL — AB (ref 150–400)
RBC: 4.29 MIL/uL (ref 3.87–5.11)
RDW: 13.1 % (ref 11.5–15.5)
WBC: 4.4 10*3/uL (ref 4.0–10.5)

## 2015-03-11 LAB — URINE MICROSCOPIC-ADD ON

## 2015-03-11 LAB — COMPREHENSIVE METABOLIC PANEL
ALK PHOS: 61 U/L (ref 38–126)
ALT: 36 U/L (ref 14–54)
AST: 41 U/L (ref 15–41)
Albumin: 3.5 g/dL (ref 3.5–5.0)
Anion gap: 3 — ABNORMAL LOW (ref 5–15)
BUN: 13 mg/dL (ref 6–20)
CALCIUM: 8.7 mg/dL — AB (ref 8.9–10.3)
CHLORIDE: 107 mmol/L (ref 101–111)
CO2: 26 mmol/L (ref 22–32)
CREATININE: 1.38 mg/dL — AB (ref 0.44–1.00)
GFR calc Af Amer: 57 mL/min — ABNORMAL LOW (ref >60)
GFR, EST NON AFRICAN AMERICAN: 50 mL/min — AB (ref >60)
Glucose, Bld: 141 mg/dL — ABNORMAL HIGH (ref 65–99)
Potassium: 3.6 mmol/L (ref 3.5–5.1)
SODIUM: 136 mmol/L (ref 135–145)
Total Bilirubin: 0.7 mg/dL (ref 0.3–1.2)
Total Protein: 6.9 g/dL (ref 6.5–8.1)

## 2015-03-11 LAB — URINALYSIS, ROUTINE W REFLEX MICROSCOPIC
BILIRUBIN URINE: NEGATIVE
GLUCOSE, UA: NEGATIVE mg/dL
KETONES UR: NEGATIVE mg/dL
Leukocytes, UA: NEGATIVE
Nitrite: NEGATIVE
PH: 6 (ref 5.0–8.0)
Protein, ur: NEGATIVE mg/dL
Specific Gravity, Urine: 1.009 (ref 1.005–1.030)
Urobilinogen, UA: 1 mg/dL (ref 0.0–1.0)

## 2015-03-11 LAB — LIPASE, BLOOD: LIPASE: 27 U/L (ref 22–51)

## 2015-03-11 MED ORDER — ACETAMINOPHEN 325 MG PO TABS
650.0000 mg | ORAL_TABLET | Freq: Once | ORAL | Status: AC
  Administered 2015-03-11: 650 mg via ORAL
  Filled 2015-03-11: qty 2

## 2015-03-11 NOTE — ED Provider Notes (Signed)
CSN: [id number]     Arrival date & time 03/11/15  1944 History   First MD Initiated Contact with Patient 03/11/15 2159     Chief Complaint  Patient presents with  . Abdominal Pain    HPI   34 year old female presents today with abdominal pain. Patient was seen yesterday at [facility] with CT abdomen and pelvis and labs. She was diagnosed with flank pain, hematuria, abdominal pain, and discharged home with Prilosec. Returns today reporting that the pain has continued to persist, she has tried taking the Prilosec but it did not relieve her abdominal "burning". Patient reports she has had diffuse abdominal pain described as burning for the last 3 days. She reports that she had some nausea and vomiting 2 days ago, but has not had any episodes since. Patient reports that she has no current nausea, vomiting, diarrhea, changes in color clarity or characteristics of her urine. Patient denies any abnormal bowel movements, blood in her stools, melena. Patient reports a history of C-sections 2.     Past Medical History  Diagnosis Date  . Gestational diabetes     GDM with last pregnancy (2008)  . History of hepatitis B 2007  . History of malaria 2003  . Difficulty reading   . History of gestational diabetes   . Obese   . History of thrombocytopenia   . Abnormal Pap smear     ascus with High Risk for HPV  . De Quervain's tenosynovitis, left    Past Surgical History  Procedure Laterality Date  . Cesarean section  2008    last pregnacy  . Cesarean section  08/21/2011    Procedure: CESAREAN SECTION;  Surgeon: Donnamae Jude, MD;  Location: [facility];  Service: Gynecology;  Laterality: N/A;   Family History  Problem Relation Age of Onset  . Hypertension Mother   . Stroke Mother    Social History  Substance Use Topics  . Smoking status: Never Smoker   . Smokeless tobacco: None  . Alcohol Use: No   OB History    Gravida Para Term Preterm AB TAB SAB Ectopic Multiple Living   5 3 3  0 2   1 0 0 3     Review of Systems  All other systems reviewed and are negative.     Allergies  Cephalexin and Chloroquine  Home Medications   Prior to Admission medications   Medication Sig Start Date End Date Taking? Authorizing Provider  acetaminophen (TYLENOL) 325 MG tablet Take 650 mg by mouth every 6 (six) hours as needed for mild pain, fever or headache.    Yes Historical Provider, MD  omeprazole (PRILOSEC) 20 MG capsule Take 1 capsule (20 mg total) by mouth daily. 03/10/15  Yes Serita Grit, MD  ondansetron (ZOFRAN) 8 MG tablet Take 1 tablet (8 mg total) by mouth every 8 (eight) hours as needed for nausea or vomiting. 10/27/13  Yes Daleen Bo, MD  butalbital-acetaminophen-caffeine (FIORICET) 50-325-40 MG per tablet Take 1-2 tablets by mouth every 6 (six) hours as needed for headache (max 6 caps/day). Patient not taking: Reported on 03/11/2015 03/07/15   Melynda Ripple, MD  metFORMIN (GLUCOPHAGE) 500 MG tablet Take 1 tablet (500 mg total) by mouth 2 (two) times daily with a meal. Patient not taking: Reported on 03/11/2015 11/04/13   Lance Bosch, NP  naproxen (NAPROSYN) 500 MG tablet Take 1 tablet (500 mg total) by mouth 2 (two) times daily. Patient not taking: Reported on 03/11/2015 03/07/15  Melynda Ripple, MD   BP 135/87 mmHg  Pulse 65  Temp(Src) 98.6 F (37 C) (Oral)  Resp 18  Wt 180 lb (81.647 kg)  SpO2 98%  LMP 02/23/2015   Physical Exam  Constitutional: She is oriented to person, place, and time. She appears well-developed and well-nourished.  HENT:  Head: Normocephalic and atraumatic.  Eyes: Conjunctivae are normal. Pupils are equal, round, and reactive to light. Right eye exhibits no discharge. Left eye exhibits no discharge. No scleral icterus.  Neck: Normal range of motion. No JVD present. No tracheal deviation present.  Cardiovascular: Normal rate, regular rhythm, normal heart sounds and intact distal pulses.  Exam reveals no friction rub.   No murmur  heard. Pulmonary/Chest: Effort normal and breath sounds normal. No stridor. No respiratory distress. She has no wheezes. She has no rales. She exhibits no tenderness.  Abdominal: Soft. Bowel sounds are normal. There is tenderness.  Diffusely tender no focal abnormalities, no signs of trauma, bruising, rebound, guarding, mass  Musculoskeletal: Normal range of motion. She exhibits no edema or tenderness.  Neurological: She is alert and oriented to person, place, and time. Coordination normal.  Skin: Skin is warm and dry.  Psychiatric: She has a normal mood and affect. Her behavior is normal. Judgment and thought content normal.  Nursing note and vitals reviewed.   ED Course  Procedures (including critical care time) Labs Review Labs Reviewed  COMPREHENSIVE METABOLIC PANEL - Abnormal; Notable for the following:    Glucose, Bld 141 (*)    Creatinine, Ser 1.38 (*)    Calcium 8.7 (*)    GFR calc non Af Amer 50 (*)    GFR calc Af Amer 57 (*)    Anion gap 3 (*)    All other components within normal limits  CBC - Abnormal; Notable for the following:    Platelets 94 (*)    All other components within normal limits  URINALYSIS, ROUTINE W REFLEX MICROSCOPIC (NOT AT [facility]) - Abnormal; Notable for the following:    Hgb urine dipstick TRACE (*)    All other components within normal limits  LIPASE, BLOOD  URINE MICROSCOPIC-ADD ON    Imaging Review Ct Renal Stone Study  03/10/2015   CLINICAL DATA:  Bilateral and diffuse abdominal pain  EXAM: CT ABDOMEN AND PELVIS WITHOUT CONTRAST  TECHNIQUE: Multidetector CT imaging of the abdomen and pelvis was performed following the standard protocol without IV contrast.  COMPARISON:  10/27/2013  FINDINGS: Lower chest: Lung bases are clear with the exception of thin curvilinear right lower lobe scarring or atelectasis.  Hepatobiliary: Nodular hepatic contour suggests cirrhosis without unenhanced evidence for focal mass or intrahepatic ductal dilatation.  Gallbladder mildly distended but otherwise unremarkable.  Pancreas: Normal  Spleen: Normal  Adrenals/Urinary Tract: Adrenal glands are normal. 2 mm nonobstructing right mid renal calculus image 36. No radiopaque ureteral, bladder, or left renal calculus. No hydroureteronephrosis.  Stomach/Bowel: Normal appendix, 61. No bowel wall thickening or focal segmental dilatation.  Vascular/Lymphatic: No aortic aneurysm.  No lymphadenopathy.  Other: Trace free fluid is likely physiologic within the pelvis. Uterus and ovaries are normal.  Musculoskeletal: No acute osseous abnormality.  IMPRESSION: No acute intra-abdominal or pelvic pathology.   Electronically Signed   By: Conchita Paris M.D.   On: 03/10/2015 16:02   I have personally reviewed and evaluated these images and lab results as part of my medical decision-making.   EKG Interpretation None      MDM   Final diagnoses:  Generalized  abdominal pain    Labs: Urinalysis, lipase, CMP, CBC- creatinine 1.38 down from 1.74 yesterday  Imaging: CT renal noted above no acute findings  Consults:  Therapeutics:  Discharge Meds: Prilosec  Assessment/Plan: Patient presents within 24 hours of her last visit for diffuse abdominal pain. She has no focal findings on her abdominal exam, her laboratory and diagnostic imaging studies are reassuring, vital signs reassuring. Patient has only tried one of the Prilosec pills that she was prescribed at the time of discharge. No significant findings on today's exam that would necessitate further evaluation or management here in the ED setting. Patient requesting work note, she will not be given a work note, she is encouraged contact her primary care provider for reevaluation and recheck of her kidney function. She is given strict return precautions         Okey Regal, PA-C 03/12/15 0015  Evelina Bucy, MD 03/12/15 [phone]

## 2015-03-11 NOTE — Discharge Instructions (Signed)
Abdominal Pain Many things can cause abdominal pain. Usually, abdominal pain is not caused by a disease and will improve without treatment. It can often be observed and treated at home. Your health care provider will do a physical exam and possibly order blood tests and X-rays to help determine the seriousness of your pain. However, in many cases, more time must pass before a clear cause of the pain can be found. Before that point, your health care provider may not know if you need more testing or further treatment. HOME CARE INSTRUCTIONS  Monitor your abdominal pain for any changes. The following actions may help to alleviate any discomfort you are experiencing:  Only take over-the-counter or prescription medicines as directed by your health care provider.  Do not take laxatives unless directed to do so by your health care provider.  Try a clear liquid diet (broth, tea, or water) as directed by your health care provider. Slowly move to a bland diet as tolerated. SEEK MEDICAL CARE IF:  You have unexplained abdominal pain.  You have abdominal pain associated with nausea or diarrhea.  You have pain when you urinate or have a bowel movement.  You experience abdominal pain that wakes you in the night.  You have abdominal pain that is worsened or improved by eating food.  You have abdominal pain that is worsened with eating fatty foods.  You have a fever. SEEK IMMEDIATE MEDICAL CARE IF:   Your pain does not go away within 2 hours.  You keep throwing up (vomiting).  Your pain is felt only in portions of the abdomen, such as the right side or the left lower portion of the abdomen.  You pass bloody or black tarry stools. MAKE SURE YOU:  Understand these instructions.   Will watch your condition.   Will get help right away if you are not doing well or get worse.  Document Released: 04/03/2005 Document Revised: 06/29/2013 Document Reviewed: 03/03/2013 [facility] Patient Information  2015 [place], [place]. This information is not intended to replace advice given to you by your health care provider. Make sure you discuss any questions you have with your health care provider.  Please continue using previously prescribed medications and follow up with her primary care provider for reevaluation

## 2015-03-11 NOTE — ED Notes (Signed)
Pt from home c/o abdominal burning. Pt was seen and evaluated at [facility] yesterday for same. She was given omeprazole. She reports pain is getting worse.

## 2015-03-22 ENCOUNTER — Encounter: Payer: Self-pay | Admitting: Internal Medicine

## 2015-03-22 ENCOUNTER — Ambulatory Visit: Payer: Self-pay | Attending: Internal Medicine | Admitting: Internal Medicine

## 2015-03-22 VITALS — BP 124/86 | HR 60 | Temp 98.0°F | Resp 16 | Ht 61.0 in | Wt 198.0 lb

## 2015-03-22 DIAGNOSIS — N926 Irregular menstruation, unspecified: Secondary | ICD-10-CM | POA: Insufficient documentation

## 2015-03-22 DIAGNOSIS — R109 Unspecified abdominal pain: Secondary | ICD-10-CM | POA: Insufficient documentation

## 2015-03-22 DIAGNOSIS — G44209 Tension-type headache, unspecified, not intractable: Secondary | ICD-10-CM | POA: Insufficient documentation

## 2015-03-22 DIAGNOSIS — E669 Obesity, unspecified: Secondary | ICD-10-CM | POA: Insufficient documentation

## 2015-03-22 DIAGNOSIS — N179 Acute kidney failure, unspecified: Secondary | ICD-10-CM | POA: Insufficient documentation

## 2015-03-22 DIAGNOSIS — Z6837 Body mass index (BMI) 37.0-37.9, adult: Secondary | ICD-10-CM | POA: Insufficient documentation

## 2015-03-22 LAB — COMPLETE METABOLIC PANEL WITH GFR
ALBUMIN: 3.5 g/dL — AB (ref 3.6–5.1)
ALK PHOS: 79 U/L (ref 33–115)
ALT: 37 U/L — AB (ref 6–29)
AST: 37 U/L — AB (ref 10–30)
BILIRUBIN TOTAL: 0.6 mg/dL (ref 0.2–1.2)
BUN: 12 mg/dL (ref 7–25)
CO2: 29 mmol/L (ref 20–31)
CREATININE: 0.78 mg/dL (ref 0.50–1.10)
Calcium: 8.8 mg/dL (ref 8.6–10.2)
Chloride: 104 mmol/L (ref 98–110)
GFR, Est African American: >89 mL/min (ref >=60)
GFR, Est Non African American: >89 mL/min (ref >=60)
GLUCOSE: 92 mg/dL (ref 65–99)
Potassium: 4.1 mmol/L (ref 3.5–5.3)
SODIUM: 142 mmol/L (ref 135–146)
TOTAL PROTEIN: 7.1 g/dL (ref 6.1–8.1)

## 2015-03-22 LAB — POCT URINALYSIS DIPSTICK
BILIRUBIN UA: NEGATIVE
Blood, UA: NEGATIVE
GLUCOSE UA: NEGATIVE
Ketones, UA: NEGATIVE
LEUKOCYTES UA: NEGATIVE
NITRITE UA: NEGATIVE
Protein, UA: NEGATIVE
Spec Grav, UA: 1.02
Urobilinogen, UA: 0.2
pH, UA: 6.5

## 2015-03-22 LAB — HEMOGLOBIN A1C
Hgb A1c MFr Bld: 7 % — ABNORMAL HIGH (ref <5.7)
Mean Plasma Glucose: 154 mg/dL — ABNORMAL HIGH (ref <117)

## 2015-03-22 LAB — POCT URINE PREGNANCY: Preg Test, Ur: NEGATIVE

## 2015-03-22 MED ORDER — CYCLOBENZAPRINE HCL 10 MG PO TABS: 10.0000 mg | ORAL_TABLET | Freq: Three times a day (TID) | ORAL | Status: DC | PRN

## 2015-03-22 NOTE — Progress Notes (Signed)
Patient ID: Brenda Cannon, female   DOB: 1980-11-30, 34 y.o.   MRN: [id number]  CC: ed f/u  HPI: Brenda Cannon is a 33 y.o. female here today for a follow up visit.  Patient has past medical history of obesity and GERD.  Patient has been seen in the ER 3 times in the past two weeks for symptoms of abdominal pain and headaches.  Patient reports that her abdominal pain was generalized and is burning in character. She has had a negative urinalysis and pregnancy test recently. She was found to have a negative renal CT as well. She was found to have elevated creatinine on last blood test that needs reevaluation. She now reports that her abdominal pain has resolved. She reports that stomach pain happened after getting toradol injection for headache.  Reports that she has periods that are very irregular. She was on Depo injection for 1 years, took last shot in JUne 2014 and has only had a couple of periods since that time. Today she reports that her left sided headaches has returned. The pain is a pressure sensation and it begins behind her left eye and radiate down the whole side of her face and back of her neck. She has mild nausea with headaches but no vomiting. Symptoms are worse with stress, better with Aleve---but has not taken since finding out her kidney function is elevated. Headache is not associated with reading. Denies photophobia, phonophobia, visual changes, nasal congestion, sinus pain/pressure, ear pain, purulent nasal d/c, dental pain, neck stiffness, rash, dysarthria, focal weakness, facial droop. No mental status changes, seizures, syncope.       Allergies  Allergen Reactions  . Cephalexin Itching and Rash  . Chloroquine Hives   Past Medical History  Diagnosis Date  . Gestational diabetes     GDM with last pregnancy (2008)  . History of hepatitis B 2007  . History of malaria 2003  . Difficulty reading   . History of gestational diabetes   . Obese   . History of  thrombocytopenia   . Abnormal Pap smear     ascus with High Risk for HPV  . De Quervain's tenosynovitis, left    Current Outpatient Prescriptions on File Prior to Visit  Medication Sig Dispense Refill  . acetaminophen (TYLENOL) 325 MG tablet Take 650 mg by mouth every 6 (six) hours as needed for mild pain, fever or headache.     . butalbital-acetaminophen-caffeine (FIORICET) 50-325-40 MG per tablet Take 1-2 tablets by mouth every 6 (six) hours as needed for headache (max 6 caps/day). (Patient not taking: Reported on 03/11/2015) 20 tablet 0  . metFORMIN (GLUCOPHAGE) 500 MG tablet Take 1 tablet (500 mg total) by mouth 2 (two) times daily with a meal. (Patient not taking: Reported on 03/11/2015) 60 tablet 3  . naproxen (NAPROSYN) 500 MG tablet Take 1 tablet (500 mg total) by mouth 2 (two) times daily. (Patient not taking: Reported on 03/11/2015) 20 tablet 0  . omeprazole (PRILOSEC) 20 MG capsule Take 1 capsule (20 mg total) by mouth daily. 14 capsule 0  . ondansetron (ZOFRAN) 8 MG tablet Take 1 tablet (8 mg total) by mouth every 8 (eight) hours as needed for nausea or vomiting. 20 tablet 0   No current facility-administered medications on file prior to visit.   Family History  Problem Relation Age of Onset  . Hypertension Mother   . Stroke Mother    Social History   Social History  . Marital Status: Single  Spouse Name: N/A  . Number of Children: N/A  . Years of Education: N/A   Occupational History  . Not on file.   Social History Main Topics  . Smoking status: Never Smoker   . Smokeless tobacco: Not on file  . Alcohol Use: No  . Drug Use: No  . Sexual Activity: Not on file   Other Topics Concern  . Not on file   Social History Narrative   ** Merged History Encounter **        Review of Systems: Other than what is stated in HPI, all other systems are negative.   Objective:   Filed Vitals:   03/22/15 1118  BP: 124/86  Pulse: 60  Temp: 98 F (36.7 C)  Resp: 16     Physical Exam  Constitutional: She is oriented to person, place, and time.  Cardiovascular: Normal rate, regular rhythm and normal heart sounds.   Pulmonary/Chest: Effort normal and breath sounds normal.  Abdominal: Soft. Bowel sounds are normal. She exhibits no distension. There is no tenderness.  Musculoskeletal: She exhibits no edema.  Neurological: She is alert and oriented to person, place, and time. No cranial nerve deficit. Coordination normal.  Skin: Skin is warm and dry.     Lab Results  Component Value Date   WBC 4.4 03/11/2015   HGB 13.0 03/11/2015   HCT 38.6 03/11/2015   MCV 90.0 03/11/2015   PLT 94* 03/11/2015   Lab Results  Component Value Date   CREATININE 1.38* 03/11/2015   BUN 13 03/11/2015   NA 136 03/11/2015   K 3.6 03/11/2015   CL 107 03/11/2015   CO2 26 03/11/2015    Lab Results  Component Value Date   HGBA1C 6.1 04/21/2014   Lipid Panel     Component Value Date/Time   CHOL 153 11/04/2013 1207   TRIG 56 11/04/2013 1207   HDL 48 11/04/2013 1207   CHOLHDL 3.2 11/04/2013 1207   VLDL 11 11/04/2013 1207   LDLCALC 94 11/04/2013 1207       Assessment and plan:   Lorraina was seen today for abdominal pain.  Diagnoses and all orders for this visit:  Tension headache -    Begin cyclobenzaprine (FLEXERIL) 10 MG tablet; Take 1 tablet (10 mg total) by mouth 3 (three) times daily as needed for muscle spasms. I have advised patient to not take Naproxen or other NSAIDs due to AKI. Patient reports that she never picked up prescription of Fioricet because she did not know if it was safe.  AKI (acute kidney injury) -     COMPLETE METABOLIC PANEL WITH GFR Last creatinine was 1.38 on 03/11/15. Levels have been trending down but I will continue to monitor Asked patient to avoid Toradol injections because it is hard on the kidneys.  Abdominal pain, unspecified abdominal location -     Urinalysis Dipstick -     POCT urine pregnancy Resolved  Irregular  periods -     Ambulatory referral to Gynecology  Obesity -     Hemoglobin A1c Weight loss discussed at length and its complications to health.  Patient will loss 10 pounds by next visit in 3 months.  Diet and exercise discussed as well as calorie intake. Patient admits to never starting Metformin.    Return if symptoms worsen or fail to improve.       Lance Bosch, [facility] [phone] 03/22/2015, 11:43 AM

## 2015-03-22 NOTE — Patient Instructions (Signed)
Only take Cyclobenzaprine for headaches right now. I will call you to let you know how your kidneys are doing soon.

## 2015-03-22 NOTE — Progress Notes (Signed)
Patient complains of burning in her abd area and also Complains of bilateral pain in her sides Patient also complains of having a headache and pain behind her left eye

## 2015-03-23 ENCOUNTER — Telehealth: Payer: Self-pay

## 2015-03-23 NOTE — Telephone Encounter (Signed)
-----   Message from Lance Bosch, NP sent at 03/22/2015 10:11 PM EDT ----- Kidney function is back to normal. I believe toradol injection caused some kidney injury but she is ok now

## 2015-03-23 NOTE — Telephone Encounter (Signed)
Spoke with patient this am and she is aware of her lab results 

## 2015-03-24 ENCOUNTER — Encounter: Payer: Self-pay | Admitting: Internal Medicine

## 2015-03-24 DIAGNOSIS — E1169 Type 2 diabetes mellitus with other specified complication: Secondary | ICD-10-CM | POA: Insufficient documentation

## 2015-03-24 DIAGNOSIS — E669 Obesity, unspecified: Secondary | ICD-10-CM

## 2015-03-24 DIAGNOSIS — E119 Type 2 diabetes mellitus without complications: Secondary | ICD-10-CM | POA: Insufficient documentation

## 2015-03-27 ENCOUNTER — Telehealth: Payer: Self-pay

## 2015-03-27 MED ORDER — METFORMIN HCL ER 500 MG PO TB24: 500.0000 mg | ORAL_TABLET | Freq: Every day | ORAL | Status: DC

## 2015-03-27 NOTE — Telephone Encounter (Signed)
-----   Message from Lance Bosch, NP sent at 03/24/2015  6:46 PM EDT ----- Patient is officially diabetic. She needs to take the Metformin now. If she does not she may suffer from long term complication of diabetes. Please explain to her this is serious and she needs medication. Please send Metformin XR 500 mg to take once daily.

## 2015-03-27 NOTE — Telephone Encounter (Signed)
Spoke with patient and she is aware of her lab results Patient understood she has diabetes and needs to pick up metformin Prescription sent to [facility] at [address]

## 2015-04-28 ENCOUNTER — Encounter: Payer: Medicaid Other | Admitting: Family Medicine

## 2015-08-18 ENCOUNTER — Ambulatory Visit: Payer: Medicaid Other | Attending: Internal Medicine

## 2015-09-19 ENCOUNTER — Ambulatory Visit: Payer: Medicaid Other | Admitting: Internal Medicine

## 2015-09-25 ENCOUNTER — Ambulatory Visit: Payer: Medicaid Other | Admitting: Internal Medicine

## 2016-03-01 ENCOUNTER — Encounter (HOSPITAL_COMMUNITY): Payer: Self-pay | Admitting: Vascular Surgery

## 2016-03-01 ENCOUNTER — Emergency Department (HOSPITAL_COMMUNITY)
Admission: EM | Admit: 2016-03-01 | Discharge: 2016-03-01 | Disposition: A | Discharge status: Home / Self Care | Payer: Medicaid Other | Attending: Emergency Medicine | Admitting: Emergency Medicine

## 2016-03-01 DIAGNOSIS — Z79899 Other long term (current) drug therapy: Secondary | ICD-10-CM | POA: Insufficient documentation

## 2016-03-01 DIAGNOSIS — E119 Type 2 diabetes mellitus without complications: Secondary | ICD-10-CM | POA: Insufficient documentation

## 2016-03-01 DIAGNOSIS — K121 Other forms of stomatitis: Secondary | ICD-10-CM | POA: Diagnosis not present

## 2016-03-01 DIAGNOSIS — K1379 Other lesions of oral mucosa: Secondary | ICD-10-CM | POA: Diagnosis present

## 2016-03-01 MED ORDER — BENZOCAINE 10 % MT GEL: 1.0000 "application " | Freq: Three times a day (TID) | OROMUCOSAL | 0 refills | Status: DC | PRN

## 2016-03-01 NOTE — ED Triage Notes (Signed)
Pt reports to the ED for eval of lesions to inner lip. Pt reports it is painful. Small area of erythema noted to inner fold of lower lip. Pt denies any fevers or chills. Pt A&Ox4, resp e/u, and skin warm and dry.

## 2016-03-01 NOTE — ED Provider Notes (Signed)
[facility] DEPT Provider Note   CSN: [id number] Arrival date & time: 03/01/16  1543  By signing my name below, I, Brenda Cannon, attest that this documentation has been prepared under the direction and in the presence of Gay Filler, PA-C. Electronically Signed: Dora Cannon, Scribe. 03/01/2016. 4:15 PM.  History   Chief Complaint Chief Complaint  Patient presents with  . Mouth Lesions    The history is provided by the patient. No language interpreter was used.     HPI Comments: Brenda Cannon is a 35 y.o. female who presents to the Emergency Department complaining of sudden onset, constant, worsening, oral lesion to the inside of her mouth beginning 2 days ago. Pain radiates to her ears and head. She endorses pain exacerbation with eating, drinking, and palpation to the sores. She states she tried ibuprofen for pain this morning with minimal relief. Reports subjective fever/chills. Pt notes she has been using lemon juice and vinegar in her beverages for the last two weeks. She denies recent medication changes or antibiotic use. Pt denies h/o herpes or experiencing similar symptoms in the past. She further denies neck pain, trouble swallowing, nasal congestion, rhinorrhea, immunocompromising conditions, other rashes/lesions, or any other associated symptoms. Her PCP is listed as Chari Manning, NP. She does not have a regular dentist.  Past Medical History:  Diagnosis Date  . Abnormal Pap smear    ascus with High Risk for HPV  . De Quervain's tenosynovitis, left   . Difficulty reading   . Gestational diabetes    GDM with last pregnancy (2008)  . History of gestational diabetes   . History of hepatitis B 2007  . History of malaria 2003  . History of thrombocytopenia   . Obese     Patient Active Problem List   Diagnosis Date Noted  . Diabetes mellitus type 2 in obese ([place]) 03/24/2015  . Metatarsal stress fracture of left foot 11/10/2013  . Allergic rhinitis 05/26/2012    . 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 History of malaria 03/04/2011  . Hepatitis B infection 03/01/2011  . H/O: C-section 03/01/2011  . History of thrombocytopenia 03/01/2011    Past Surgical History:  Procedure Laterality Date  . CESAREAN SECTION  2008   last pregnacy  . CESAREAN SECTION  08/21/2011   Procedure: CESAREAN SECTION;  Surgeon: Donnamae Jude, MD;  Location: [facility];  Service: Gynecology;  Laterality: N/A;    OB History    Gravida Para Term Preterm AB Living   5 3 3  0 2 3   SAB TAB Ectopic Multiple Live Births   1   0 0 3       Home Medications    Prior to Admission medications   Medication Sig Start Date End Date Taking? Authorizing Provider  acetaminophen (TYLENOL) 325 MG tablet Take 650 mg by mouth every 6 (six) hours as needed for mild pain, fever or headache.     Historical Provider, MD  benzocaine (ORAJEL) 10 % mucosal gel Use as directed 1 application in the mouth or throat 3 (three) times daily as needed for mouth pain. 03/01/16   Roxanna Mew, PA-C  butalbital-acetaminophen-caffeine (FIORICET) [phone] MG per tablet Take 1-2 tablets by mouth every 6 (six) hours as needed for headache (max 6 caps/day). Patient not taking: Reported on 03/11/2015 03/07/15   Melynda Ripple, MD  cyclobenzaprine (FLEXERIL) 10 MG tablet Take 1 tablet (10 mg total) by mouth 3 (three) times daily as needed for muscle  spasms. 03/22/15   Lance Bosch, NP  metFORMIN (GLUCOPHAGE XR) 500 MG 24 hr tablet Take 1 tablet (500 mg total) by mouth daily with breakfast. 03/27/15   Lance Bosch, NP  omeprazole (PRILOSEC) 20 MG capsule Take 1 capsule (20 mg total) by mouth daily. 03/10/15   Serita Grit, MD    Family History Family History  Problem Relation Age of Onset  . Hypertension Mother   . Stroke Mother     Social History Social History  Substance Use Topics  . Smoking status: Never Smoker  . Smokeless tobacco: Never Used  . Alcohol use No     Allergies    Cephalexin and Chloroquine   Review of Systems Review of Systems  Constitutional: Positive for chills ( subjective) and fever ( subjective).  HENT: Positive for mouth sores (gum). Negative for congestion, rhinorrhea and trouble swallowing.   Respiratory: Negative for shortness of breath.   Musculoskeletal: Negative for neck pain.  Skin: Negative for rash.  Allergic/Immunologic: Negative for immunocompromised state.  Neurological: Positive for headaches ( radiation from oral pain).    Physical Exam Updated Vital Signs BP 113/74 (BP Location: Right Arm)   Pulse 61   Temp 98.4 F (36.9 C) (Oral)   Resp 16   SpO2 98%   Physical Exam  Constitutional: She appears well-developed and well-nourished. No distress.  HENT:  Head: Normocephalic and atraumatic.  Right Ear: Tympanic membrane, external ear and ear canal normal.  Left Ear: Tympanic membrane, external ear and ear canal normal.  Nose: Right sinus exhibits no maxillary sinus tenderness and no frontal sinus tenderness. Left sinus exhibits no maxillary sinus tenderness and no frontal sinus tenderness.  Mouth/Throat: Uvula is midline, oropharynx is clear and moist and mucous membranes are normal. Oral lesions present. No trismus in the jaw. No uvula swelling or dental caries.  One 63mm ulceration at the base of gingiva below tooth #24 with mild surrounding erythema and TTP. No area of fluctuance. No active drainage. No TTP of teeth. No swelling tongue. No TTP of sublingual region.   Eyes: Conjunctivae and EOM are normal. Pupils are equal, round, and reactive to light.  Neck: Normal range of motion and phonation normal. Neck supple. No neck rigidity. No tracheal deviation and normal range of motion present.  No cervical lymphadenopathy.   Cardiovascular: Normal rate and intact distal pulses.   Pulmonary/Chest: Effort normal. No stridor. No respiratory distress.  Abdominal: She exhibits no distension.  Musculoskeletal: Normal range of  motion.  Neurological: She is alert.  Skin: Skin is warm and dry. She is not diaphoretic.  Psychiatric: She has a normal mood and affect. Her behavior is normal.  Nursing note and vitals reviewed.  ED Treatments / Results  Labs (all labs ordered are listed, but only abnormal results are displayed) Labs Reviewed - No data to display  EKG  EKG Interpretation None       Radiology No results found.  Procedures Procedures (including critical care time)  DIAGNOSTIC STUDIES: Oxygen Saturation is 98% on RA, normal by my interpretation.    COORDINATION OF CARE: 4:15 PM Discussed treatment plan with pt at bedside and pt agreed to plan.  Medications Ordered in ED Medications - No data to display   Initial Impression / Assessment and Plan / ED Course  I have reviewed the triage vital signs and the nursing notes.  Pertinent labs & imaging results that were available during my care of the patient were reviewed by me and  considered in my medical decision making (see chart for details).  Clinical Course    Patient presents to ED with complaint of oral pain. Patient is afebrile and non-toxic appearing in NAD. VSS. Physical exam remarkable for 22mm ulceration at base of anterior gingiva at tooth #24. No trismus. Uvula is midline. No swelling of tongue. Normal phonation. No stridor. Neck ROM intact. Low suspicion for ludwig's angina, PTA, or deep space infection. Suspect ulcer. Discussed symptomatic management to include hydration, avoiding acidic foods, and benzocaine application. Encouraged follow up with PCP if sxs persist for more than one week. Return precautions discussed.     I personally performed the services described in this documentation, which was scribed in my presence. The recorded information has been reviewed and is accurate.   Final Clinical Impressions(s) / ED Diagnoses   Final diagnoses:  Oral ulcer    New Prescriptions Discharge Medication List as of 03/01/2016   4:37 PM    START taking these medications   Details  benzocaine (ORAJEL) 10 % mucosal gel Use as directed 1 application in the mouth or throat 3 (three) times daily as needed for mouth pain., Starting Fri 03/01/2016, Print         [place], PA-C 03/01/16 1739    Dorie Rank, MD 03/02/16 [phone]

## 2016-03-01 NOTE — ED Notes (Signed)
Pt stable, ambulatory, states understanding of discharge instructions 

## 2016-03-01 NOTE — Discharge Instructions (Signed)
Read the information below.   I suspect you may have an ulcer. Avoid acidic foods. Drink plenty of fluids.  I have prescribed orajel for symptomatic relief. These can take 1-2 weeks to heal.  Use the prescribed medication as directed.  Please discuss all new medications with your pharmacist.   Follow up with your primary care provider in 1 week if symptoms persist. You may return to the Emergency Department at any time for worsening condition or any new symptoms that concern you. Return to ED if you have have oral swelling, trouble breathing, or trouble swallowing.

## 2016-04-01 ENCOUNTER — Ambulatory Visit (HOSPITAL_COMMUNITY)
Admission: EM | Admit: 2016-04-01 | Discharge: 2016-04-01 | Disposition: A | Discharge status: Home / Self Care | Payer: Medicaid Other | Attending: Family Medicine | Admitting: Family Medicine

## 2016-04-01 ENCOUNTER — Encounter (HOSPITAL_COMMUNITY): Payer: Self-pay | Admitting: Emergency Medicine

## 2016-04-01 DIAGNOSIS — L02411 Cutaneous abscess of right axilla: Secondary | ICD-10-CM

## 2016-04-01 MED ORDER — DOXYCYCLINE HYCLATE 100 MG PO TABS: 100.0000 mg | ORAL_TABLET | Freq: Two times a day (BID) | ORAL | 0 refills | Status: DC

## 2016-04-01 MED ORDER — HYDROCODONE-ACETAMINOPHEN 5-325 MG PO TABS: 1.0000 | ORAL_TABLET | Freq: Four times a day (QID) | ORAL | 0 refills | Status: DC | PRN

## 2016-04-01 NOTE — ED Triage Notes (Signed)
Abscess in right axilla.  Noticed abscess 3 days ago

## 2016-04-01 NOTE — ED Provider Notes (Signed)
[facility]    CSN: [id number] Arrival date & time: 04/01/16  1551  First Provider Contact:  First MD Initiated Contact with Patient 04/01/16 1648        History   Chief Complaint Chief Complaint  Patient presents with  . Abscess    HPI Brenda Cannon is a 35 y.o. female.   Is a 36 year old woman who is originally from [place]. She does housekeeping now.  She presents with a three-day history of right axillary pain and swelling.      Past Medical History:  Diagnosis Date  . Abnormal Pap smear    ascus with High Risk for HPV  . De Quervain's tenosynovitis, left   . Difficulty reading   . Gestational diabetes    GDM with last pregnancy (2008)  . History of gestational diabetes   . History of hepatitis B 2007  . History of malaria 2003  . History of thrombocytopenia   . Obese     Patient Active Problem List   Diagnosis Date Noted  . Diabetes mellitus type 2 in obese ([place]) 03/24/2015  . Metatarsal stress fracture of left foot 11/10/2013  . Allergic rhinitis 05/26/2012  . 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 History of malaria 03/04/2011  . Hepatitis B infection 03/01/2011  . H/O: C-section 03/01/2011  . History of thrombocytopenia 03/01/2011    Past Surgical History:  Procedure Laterality Date  . CESAREAN SECTION  2008   last pregnacy  . CESAREAN SECTION  08/21/2011   Procedure: CESAREAN SECTION;  Surgeon: Donnamae Jude, MD;  Location: [facility];  Service: Gynecology;  Laterality: N/A;    OB History    Gravida Para Term Preterm AB Living   5 3 3  0 2 3   SAB TAB Ectopic Multiple Live Births   1   0 0 3       Home Medications    Prior to Admission medications   Medication Sig Start Date End Date Taking? Authorizing Provider  acetaminophen (TYLENOL) 325 MG tablet Take 650 mg by mouth every 6 (six) hours as needed for mild pain, fever or headache.     Historical Provider, MD  doxycycline (VIBRA-TABS) 100 MG tablet Take 1  tablet (100 mg total) by mouth 2 (two) times daily. 04/01/16   Robyn Haber, MD  HYDROcodone-acetaminophen (NORCO) 5-325 MG tablet Take 1 tablet by mouth every 6 (six) hours as needed for moderate pain. 04/01/16   Robyn Haber, MD  metFORMIN (GLUCOPHAGE XR) 500 MG 24 hr tablet Take 1 tablet (500 mg total) by mouth daily with breakfast. 03/27/15   Lance Bosch, NP  omeprazole (PRILOSEC) 20 MG capsule Take 1 capsule (20 mg total) by mouth daily. 03/10/15   Serita Grit, MD    Family History Family History  Problem Relation Age of Onset  . Hypertension Mother   . Stroke Mother     Social History Social History  Substance Use Topics  . Smoking status: Never Smoker  . Smokeless tobacco: Never Used  . Alcohol use No     Allergies   Cephalexin and Chloroquine   Review of Systems Review of Systems  Constitutional: Negative.   HENT: Negative.   Eyes: Negative.   Respiratory: Negative.   Cardiovascular: Negative.      Physical Exam Triage Vital Signs ED Triage Vitals  Enc Vitals Group     BP 04/01/16 1621 118/81     Pulse Rate 04/01/16 1621 66  Resp 04/01/16 1621 12     Temp 04/01/16 1621 98.1 F (36.7 C)     Temp Source 04/01/16 1621 Oral     SpO2 04/01/16 1621 98 %     Weight --      Height --      Head Circumference --      Peak Flow --      Pain Score 04/01/16 1644 5     Pain Loc --      Pain Edu? --      Excl. in [place]? --    No data found.   Updated Vital Signs BP 118/81 (BP Location: Left Arm)   Pulse 66   Temp 98.1 F (36.7 C) (Oral)   Resp 12   SpO2 98%   Visual Acuity Right Eye Distance:   Left Eye Distance:   Bilateral Distance:    Right Eye Near:   Left Eye Near:    Bilateral Near:     Physical Exam  Constitutional: She appears well-developed and well-nourished.  HENT:  Head: Normocephalic.  Right Ear: External ear normal.  Left Ear: External ear normal.  Mouth/Throat: Oropharynx is clear and moist.  Eyes: Conjunctivae and EOM  are normal. Pupils are equal, round, and reactive to light.  Neck: Normal range of motion. Neck supple. No thyromegaly present.  Cardiovascular: Normal rate and regular rhythm.   Skin: Skin is warm and dry.  1 cm abscess in right axilla  Nursing note and vitals reviewed.    UC Treatments / Results  Labs (all labs ordered are listed, but only abnormal results are displayed) Labs Reviewed - No data to display  EKG  EKG Interpretation None       Radiology No results found.  Procedures .Marland KitchenIncision and Drainage Date/Time: 04/01/2016 5:05 PM Performed by: Robyn Haber Authorized by: Robyn Haber   Consent:    Consent obtained:  Verbal   Consent given by:  Patient   Risks discussed:  Infection   Alternatives discussed:  No treatment Location:    Type:  Abscess   Location:  Upper extremity   Upper extremity location:  Arm   Arm location:  R upper arm Pre-procedure details:    Skin preparation:  Betadine Anesthesia (see MAR for exact dosages):    Anesthesia method:  Local infiltration   Local anesthetic:  Lidocaine 1% w/o epi Procedure type:    Complexity:  Simple Procedure details:    Needle aspiration: no     Incision types:  Stab incision   Incision depth:  Dermal   Scalpel blade:  11   Wound management:  Probed and deloculated   Drainage:  Bloody and purulent   Drainage amount:  Moderate   Wound treatment:  Wound left open   Packing materials:  None Post-procedure details:    Patient tolerance of procedure:  Tolerated well, no immediate complications   (including critical care time)  Medications Ordered in UC Medications - No data to display   Initial Impression / Assessment and Plan / UC Course  I have reviewed the triage vital signs and the nursing notes.  Pertinent labs & imaging results that were available during my care of the patient were reviewed by me and considered in my medical decision making (see chart for details).  Clinical Course        Final Clinical Impressions(s) / UC Diagnoses   Final diagnoses:  Abscess of axilla, right    New Prescriptions New Prescriptions   DOXYCYCLINE (VIBRA-TABS)  100 MG TABLET    Take 1 tablet (100 mg total) by mouth 2 (two) times daily.   HYDROCODONE-ACETAMINOPHEN (NORCO) 5-325 MG TABLET    Take 1 tablet by mouth every 6 (six) hours as needed for moderate pain.     Robyn Haber, MD 04/01/16 1710

## 2016-07-15 ENCOUNTER — Ambulatory Visit (HOSPITAL_COMMUNITY)
Admission: EM | Admit: 2016-07-15 | Discharge: 2016-07-15 | Disposition: A | Discharge status: Home / Self Care | Payer: Medicaid Other | Attending: Emergency Medicine | Admitting: Emergency Medicine

## 2016-07-15 ENCOUNTER — Encounter (HOSPITAL_COMMUNITY): Payer: Self-pay | Admitting: Emergency Medicine

## 2016-07-15 DIAGNOSIS — J0110 Acute frontal sinusitis, unspecified: Secondary | ICD-10-CM | POA: Diagnosis not present

## 2016-07-15 MED ORDER — PREDNISONE 50 MG PO TABS: ORAL_TABLET | ORAL | 0 refills | Status: DC

## 2016-07-15 MED ORDER — AMOXICILLIN-POT CLAVULANATE 875-125 MG PO TABS: 1.0000 | ORAL_TABLET | Freq: Two times a day (BID) | ORAL | 0 refills | Status: DC

## 2016-07-15 NOTE — ED Provider Notes (Signed)
[facility]    CSN: [id number] Arrival date & time: 07/15/16  1551     History   Chief Complaint Chief Complaint  Patient presents with  . Otalgia    HPI Brenda Cannon is a 36 y.o. female.   HPI  She is a 36 year old woman here for evaluation of sinus pain and pressure. Her symptoms started a week ago with nasal congestion, sinus pressure, bilateral ear pain, cough. Things have gradually been getting worse. She reports some chest discomfort and shortness of breath with cough only. devers, but she does report some night sweats. She has tried NyQuil without improvement.   Past Medical History:  Diagnosis Date  . Abnormal Pap smear    ascus with High Risk for HPV  . De Quervain's tenosynovitis, left   . Difficulty reading   . Gestational diabetes    GDM with last pregnancy (2008)  . History of gestational diabetes   . History of hepatitis B 2007  . History of malaria 2003  . History of thrombocytopenia   . Obese     Patient Active Problem List   Diagnosis Date Noted  . Diabetes mellitus type 2 in obese ([place]) 03/24/2015  . Metatarsal stress fracture of left foot 11/10/2013  . Allergic rhinitis 05/26/2012  . 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 History of malaria 03/04/2011  . Hepatitis B infection 03/01/2011  . H/O: C-section 03/01/2011  . History of thrombocytopenia 03/01/2011    Past Surgical History:  Procedure Laterality Date  . CESAREAN SECTION  2008   last pregnacy  . CESAREAN SECTION  08/21/2011   Procedure: CESAREAN SECTION;  Surgeon: Donnamae Jude, MD;  Location: [facility];  Service: Gynecology;  Laterality: N/A;    OB History    Gravida Para Term Preterm AB Living   5 3 3  0 2 3   SAB TAB Ectopic Multiple Live Births   1   0 0 3       Home Medications    Prior to Admission medications   Medication Sig Start Date End Date Taking? Authorizing Provider  amoxicillin-clavulanate (AUGMENTIN) 875-125 MG tablet Take 1 tablet  by mouth 2 (two) times daily. 07/15/16   Melony Overly, MD  predniSONE (DELTASONE) 50 MG tablet Take 1 pill daily for 5 days. 07/15/16   Melony Overly, MD    Family History Family History  Problem Relation Age of Onset  . Hypertension Mother   . Stroke Mother     Social History Social History  Substance Use Topics  . Smoking status: Never Smoker  . Smokeless tobacco: Never Used  . Alcohol use No     Allergies   Cephalexin and Chloroquine   Review of Systems Review of Systems As in history of present illness  Physical Exam Triage Vital Signs ED Triage Vitals [07/15/16 1730]  Enc Vitals Group     BP 117/87     Pulse Rate (!) 59     Resp 16     Temp 98.2 F (36.8 C)     Temp Source Oral     SpO2 100 %     Weight      Height      Head Circumference      Peak Flow      Pain Score 5     Pain Loc      Pain Edu?      Excl. in [place]?    No data found.  Updated Vital Signs BP 117/87 (BP Location: Left Arm)   Pulse (!) 59   Temp 98.2 F (36.8 C) (Oral)   Resp 16   LMP 07/12/2016 (Exact Date)   SpO2 100%   Visual Acuity Right Eye Distance:   Left Eye Distance:   Bilateral Distance:    Right Eye Near:   Left Eye Near:    Bilateral Near:     Physical Exam  Constitutional: She is oriented to person, place, and time. She appears well-developed and well-nourished. No distress.  HENT:  Diffuse sinus tenderness. TMs retracted bilaterally. Nasal mucosa is erythematous and edematous with drainage present. Oropharynx with minor erythema.  Neck: Neck supple.  Cardiovascular: Normal rate, regular rhythm and normal heart sounds.   No murmur heard. Pulmonary/Chest: Effort normal and breath sounds normal. No respiratory distress. She has no wheezes. She has no rales.  Lymphadenopathy:    She has no cervical adenopathy.  Neurological: She is alert and oriented to person, place, and time.     UC Treatments / Results  Labs (all labs ordered are listed, but only abnormal  results are displayed) Labs Reviewed - No data to display  EKG  EKG Interpretation None       Radiology No results found.  Procedures Procedures (including critical care time)  Medications Ordered in UC Medications - No data to display   Initial Impression / Assessment and Plan / UC Course  I have reviewed the triage vital signs and the nursing notes.  Pertinent labs & imaging results that were available during my care of the patient were reviewed by me and considered in my medical decision making (see chart for details).  Clinical Course     Treat for sinusitis with Augmentin and prednisone. Recommended frequent use of nasal saline spray. Follow-up as needed.  Final Clinical Impressions(s) / UC Diagnoses   Final diagnoses:  Acute non-recurrent frontal sinusitis    New Prescriptions Discharge Medication List as of 07/15/2016  6:24 PM    START taking these medications   Details  amoxicillin-clavulanate (AUGMENTIN) 875-125 MG tablet Take 1 tablet by mouth 2 (two) times daily., Starting Mon 07/15/2016, Normal    predniSONE (DELTASONE) 50 MG tablet Take 1 pill daily for 5 days., Normal         Melony Overly, MD 07/15/16 [phone]

## 2016-07-15 NOTE — ED Triage Notes (Signed)
The patient presented to the [facility] with a complaint of bilateral ear pain, nasal congestion and a cough x 1 week.

## 2016-07-15 NOTE — Discharge Instructions (Signed)
You have a sinus infection. Take Augmentin twice a day for 10 days. Take prednisone daily for 5 days. Use nasal saline spray as often as you can. Follow-up as needed.

## 2016-07-22 ENCOUNTER — Emergency Department (HOSPITAL_COMMUNITY)
Admission: EM | Admit: 2016-07-22 | Discharge: 2016-07-22 | Disposition: A | Discharge status: Home / Self Care | Payer: Medicaid Other | Attending: Emergency Medicine | Admitting: Emergency Medicine

## 2016-07-22 ENCOUNTER — Encounter (HOSPITAL_COMMUNITY): Payer: Self-pay | Admitting: Emergency Medicine

## 2016-07-22 ENCOUNTER — Emergency Department (HOSPITAL_COMMUNITY): Payer: Medicaid Other

## 2016-07-22 DIAGNOSIS — J0100 Acute maxillary sinusitis, unspecified: Secondary | ICD-10-CM | POA: Insufficient documentation

## 2016-07-22 DIAGNOSIS — Z79899 Other long term (current) drug therapy: Secondary | ICD-10-CM | POA: Diagnosis not present

## 2016-07-22 DIAGNOSIS — E119 Type 2 diabetes mellitus without complications: Secondary | ICD-10-CM | POA: Insufficient documentation

## 2016-07-22 DIAGNOSIS — K219 Gastro-esophageal reflux disease without esophagitis: Secondary | ICD-10-CM | POA: Diagnosis not present

## 2016-07-22 DIAGNOSIS — J069 Acute upper respiratory infection, unspecified: Secondary | ICD-10-CM | POA: Diagnosis not present

## 2016-07-22 DIAGNOSIS — R079 Chest pain, unspecified: Secondary | ICD-10-CM | POA: Diagnosis present

## 2016-07-22 MED ORDER — RANITIDINE HCL 150 MG/10ML PO SYRP
150.0000 mg | ORAL_SOLUTION | Freq: Once | ORAL | Status: AC
  Administered 2016-07-22: 150 mg via ORAL
  Filled 2016-07-22: qty 10

## 2016-07-22 MED ORDER — DOXYCYCLINE HYCLATE 100 MG PO CAPS: 100.0000 mg | ORAL_CAPSULE | Freq: Two times a day (BID) | ORAL | 0 refills | Status: DC

## 2016-07-22 MED ORDER — ONDANSETRON HCL 4 MG PO TABS: 4.0000 mg | ORAL_TABLET | Freq: Three times a day (TID) | ORAL | 0 refills | Status: DC | PRN

## 2016-07-22 MED ORDER — GI COCKTAIL ~~LOC~~
30.0000 mL | Freq: Once | ORAL | Status: AC
  Administered 2016-07-22: 30 mL via ORAL
  Filled 2016-07-22: qty 30

## 2016-07-22 MED ORDER — RANITIDINE HCL 150 MG PO TABS: 150.0000 mg | ORAL_TABLET | Freq: Two times a day (BID) | ORAL | 0 refills | Status: DC

## 2016-07-22 MED ORDER — ONDANSETRON 4 MG PO TBDP
4.0000 mg | ORAL_TABLET | Freq: Once | ORAL | Status: AC
  Administered 2016-07-22: 4 mg via ORAL
  Filled 2016-07-22: qty 1

## 2016-07-22 MED ORDER — DOXYCYCLINE HYCLATE 100 MG PO TABS
100.0000 mg | ORAL_TABLET | Freq: Once | ORAL | Status: AC
  Administered 2016-07-22: 100 mg via ORAL
  Filled 2016-07-22: qty 1

## 2016-07-22 NOTE — ED Notes (Signed)
Pt transported to xray 

## 2016-07-22 NOTE — ED Notes (Signed)
Pt stated she hurts when she eats or drinks anything. She stated it burns when she drinks or eats anything.

## 2016-07-22 NOTE — Discharge Instructions (Signed)
Please take the new antibiotic to treat your previously diagnosed sinusitis. You should not have an allergic reaction to this medicine. Please take your Zantac for your reflux discomfort. Your workup showed no evidence of pneumonia. You may also have a viral upper respiratory infection. Please wash your hands well and stay hydrated. Please take nausea medicine as needed for your nausea. Please follow-up with your primary doctor in the next few days and, if any symptoms worsen, please return to the nearest emergency department.

## 2016-07-22 NOTE — ED Triage Notes (Signed)
Cold started last  Week went to [facility] and told she has sinus infection , meds she was  Given made her have heartburn she stopped  Taking  Them did not go back get more meds

## 2016-07-22 NOTE — ED Provider Notes (Signed)
[facility] DEPT Provider Note   CSN: [id number] Arrival date & time: 07/22/16  1601  By signing my name below, I, Delton Prairie, attest that this documentation has been prepared under the direction and in the presence of Courtney Paris, MD  Electronically Signed: Delton Prairie, ED Scribe. 07/22/16. 6:43 PM.  History   Chief Complaint Chief Complaint  Patient presents with  . Cough  . Back Pain  . Chest Pain   The history is provided by the patient. No language interpreter was used.   HPI Comments:  Brenda Cannon is a 36 y.o. female who presents to the Emergency Department complaining of "7/10" burning chest pain x yesterday. Her pain is worse after eating and upon palpation. Pt also reports congestion x 2 weeks, gradually improving cough, sinus pressure, headache, generalized body aches, ear pain and states she was diagnosed with a sinus infection. Pt states she took 2 days of Augmentin and 4 days of prednisone and stopped taking the medication due to leg tingling and back pain. She notes her leg tingling and back pain resided after stopping her medications. No alleviating factors noted. Pt denies abdominal pain, nausea, vomiting, constipation, diarrhea, dysuria and leg swelling. No other associated symptoms noted. Pt is allergic to Cephalexin and chloroquine.   Past Medical History:  Diagnosis Date  . Abnormal Pap smear    ascus with High Risk for HPV  . De Quervain's tenosynovitis, left   . Difficulty reading   . Gestational diabetes    GDM with last pregnancy (2008)  . History of gestational diabetes   . History of hepatitis B 2007  . History of malaria 2003  . History of thrombocytopenia   . Obese     Patient Active Problem List   Diagnosis Date Noted  . Diabetes mellitus type 2 in obese ([place]) 03/24/2015  . Metatarsal stress fracture of left foot 11/10/2013  . Allergic rhinitis 05/26/2012  . 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 History of  malaria 03/04/2011  . Hepatitis B infection 03/01/2011  . H/O: C-section 03/01/2011  . History of thrombocytopenia 03/01/2011    Past Surgical History:  Procedure Laterality Date  . CESAREAN SECTION  2008   last pregnacy  . CESAREAN SECTION  08/21/2011   Procedure: CESAREAN SECTION;  Surgeon: Donnamae Jude, MD;  Location: [facility];  Service: Gynecology;  Laterality: N/A;    OB History    Gravida Para Term Preterm AB Living   5 3 3  0 2 3   SAB TAB Ectopic Multiple Live Births   1   0 0 3       Home Medications    Prior to Admission medications   Medication Sig Start Date End Date Taking? Authorizing Provider  amoxicillin-clavulanate (AUGMENTIN) 875-125 MG tablet Take 1 tablet by mouth 2 (two) times daily. 07/15/16   Melony Overly, MD  predniSONE (DELTASONE) 50 MG tablet Take 1 pill daily for 5 days. 07/15/16   Melony Overly, MD    Family History Family History  Problem Relation Age of Onset  . Hypertension Mother   . Stroke Mother     Social History Social History  Substance Use Topics  . Smoking status: Never Smoker  . Smokeless tobacco: Never Used  . Alcohol use No     Allergies   Cephalexin and Chloroquine   Review of Systems Review of Systems  Constitutional: Negative for activity change, chills, diaphoresis, fatigue and fever.  HENT: Positive  for congestion. Negative for rhinorrhea.   Eyes: Negative for visual disturbance.  Respiratory: Positive for cough. Negative for chest tightness, shortness of breath and stridor.   Cardiovascular: Positive for chest pain. Negative for palpitations and leg swelling.  Gastrointestinal: Negative for abdominal distention, abdominal pain, constipation, diarrhea, nausea and vomiting.  Genitourinary: Negative for difficulty urinating, dysuria, flank pain, frequency, hematuria, menstrual problem, pelvic pain, vaginal bleeding and vaginal discharge.  Musculoskeletal: Positive for back pain and myalgias. Negative for neck pain.    Skin: Negative for rash and wound.  Neurological: Positive for headaches. Negative for dizziness, weakness, light-headedness and numbness.  Psychiatric/Behavioral: Negative for agitation and confusion.  All other systems reviewed and are negative.    Physical Exam Updated Vital Signs BP 120/91 (BP Location: Right Arm)   Pulse 67   Temp 98.6 F (37 C) (Oral)   Resp 16   LMP 07/12/2016 (Exact Date)   SpO2 98%   Physical Exam  Constitutional: She is oriented to person, place, and time. She appears well-developed and well-nourished. No distress.  HENT:  Head: Normocephalic and atraumatic.  Right Ear: External ear normal.  Left Ear: External ear normal.  Nose: Rhinorrhea present.  Mouth/Throat: Oropharynx is clear and moist. No oropharyngeal exudate.  Audible congestion  Eyes: Conjunctivae and EOM are normal. Pupils are equal, round, and reactive to light.  Neck: Normal range of motion. Neck supple.  Pulmonary/Chest: Effort normal and breath sounds normal. No stridor. No respiratory distress. She exhibits tenderness.  reproducible chest discomfort.   Abdominal: She exhibits no distension. There is no tenderness. There is no rebound.  Neurological: She is alert and oriented to person, place, and time. She has normal reflexes. She exhibits normal muscle tone. Coordination normal.  Skin: Skin is warm. No rash noted. She is not diaphoretic. No erythema.  Nursing note and vitals reviewed.    ED Treatments / Results  DIAGNOSTIC STUDIES:  Oxygen Saturation is 98% on RA, normal by my interpretation.    COORDINATION OF CARE:  6:41 PM Discussed treatment plan with pt at bedside and pt agreed to plan.  Labs (all labs ordered are listed, but only abnormal results are displayed) Labs Reviewed - No data to display  EKG  EKG Interpretation  Date/Time:  Monday July 22 2016 16:23:27 EST Ventricular Rate:  74 PR Interval:  154 QRS Duration: 90 QT Interval:  368 QTC  Calculation: 408 R Axis:   47 Text Interpretation:  Normal sinus rhythm Normal ECG No STEMI Confirmed by Sherry Ruffing MD, [place] [phone]) on 07/22/2016 7:47:10 PM       Radiology Dg Chest 2 View  Result Date: 07/22/2016 CLINICAL DATA:  Chest pain and cough ; shortness of breath EXAM: CHEST  2 VIEW COMPARISON:  August 09, 2013 FINDINGS: Lungs are clear. Heart size and pulmonary vascularity are normal. No adenopathy. No pneumothorax. No bone lesions. IMPRESSION: No edema or consolidation. Electronically Signed   By: Lowella Grip III M.D.   On: 07/22/2016 19:49    Procedures Procedures (including critical care time)  Medications Ordered in ED Medications  gi cocktail (Maalox,Lidocaine,Donnatal) (30 mLs Oral Given 07/22/16 1845)  ondansetron (ZOFRAN-ODT) disintegrating tablet 4 mg (4 mg Oral Given 07/22/16 1845)  doxycycline (VIBRA-TABS) tablet 100 mg (100 mg Oral Given 07/22/16 2139)  ranitidine (ZANTAC) 150 MG/10ML syrup 150 mg (150 mg Oral Given 07/22/16 2139)     Initial Impression / Assessment and Plan / ED Course  I have reviewed the triage vital signs and the nursing  notes.  Pertinent labs & imaging results that were available during my care of the patient were reviewed by me and considered in my medical decision making (see chart for details).  Clinical Course    Brenda Cannon is a 36 y.o. female who presents to the Emergency Department with congestion, cough, sinus pressure, headache, generalized body aches, heartburn, ear pain and states she was diagnosed with a sinus infection last week s/p antibiotic initiation and subsequent discontinuation after intolerance.   History and exam are seen above.  On exam, patient's lungs were clear. Patient's chest was tender to palpation. Patient's abdomen was nontender. Patient had audible congestion visible rhinorrhea. Patient had some maxillary tenderness.  Exam otherwise unremarkable with no lower extremity edema, rashes, or other  findings.  Based on patient's symptoms, suspect upper respiratory infection versus continuation of her recently diagnosed sinusitis. Patient's burning in her chest feels "the same" as her chronic heartburn. Patient says that it is worsened with eating and feels better after burping. EKG showed no signs of STEMI.  Given patient's report of cough and symptoms, patient had chest x-ray to evaluate for pneumonia. Chest x-ray showed no evidence of consolidation or edema. Doubt pneumonia. For her sinusitis, patient will be given a dose of doxycycline and switch to this antibiotic.  Shin given GI cocktail with improvement in burning. Patient given dose of Zantac for further management. Patient given prescription for Zantac for ongoing reflux management. Patient says she does not take any medicine at this time for that.  Given patient's reassuring workup, suspect patient is stable for discharge. Patient felt better after GI cocktail, Zantac, and her medicine. Patient will be given prescription for doxycycline for sinusitis, Zofran for her nausea, and Zantac for her reflux. Patient instructed to follow-up with a primary care physician in the next few days as well as strict return precautions for any new or worsening symptoms. Suspect component of viral upper respiratory infection as well as her sinusitis.  Patient understood return precautions and had improvement in symptoms. Patient discharged in good condition.     Final Clinical Impressions(s) / ED Diagnoses   Final diagnoses:  Acute maxillary sinusitis, recurrence not specified  Upper respiratory tract infection, unspecified type  Gastroesophageal reflux disease, esophagitis presence not specified    New Prescriptions Discharge Medication List as of 07/22/2016  9:02 PM    START taking these medications   Details  doxycycline (VIBRAMYCIN) 100 MG capsule Take 1 capsule (100 mg total) by mouth 2 (two) times daily., Starting Mon 07/22/2016, Print     ondansetron (ZOFRAN) 4 MG tablet Take 1 tablet (4 mg total) by mouth every 8 (eight) hours as needed for nausea or vomiting., Starting Mon 07/22/2016, Print    ranitidine (ZANTAC) 150 MG tablet Take 1 tablet (150 mg total) by mouth 2 (two) times daily., Starting Mon 07/22/2016, Print       I personally performed the services described in this documentation, which was scribed in my presence. The recorded information has been reviewed and is accurate.  Clinical Impression: 1. Acute maxillary sinusitis, recurrence not specified   2. Upper respiratory tract infection, unspecified type   3. Gastroesophageal reflux disease, esophagitis presence not specified     Disposition: Discharge  Condition: Good  I have discussed the results, Dx and Tx plan with the pt(& family if present). He/she/they expressed understanding and agree(s) with the plan. Discharge instructions discussed at great length. Strict return precautions discussed and pt &/or family have verbalized understanding of  the instructions. No further questions at time of discharge.    Discharge Medication List as of 07/22/2016  9:02 PM    START taking these medications   Details  doxycycline (VIBRAMYCIN) 100 MG capsule Take 1 capsule (100 mg total) by mouth 2 (two) times daily., Starting Mon 07/22/2016, Print    ondansetron (ZOFRAN) 4 MG tablet Take 1 tablet (4 mg total) by mouth every 8 (eight) hours as needed for nausea or vomiting., Starting Mon 07/22/2016, Print    ranitidine (ZANTAC) 150 MG tablet Take 1 tablet (150 mg total) by mouth 2 (two) times daily., Starting Mon 07/22/2016, Print        Follow Up: Lance Bosch, NP     [facility] [address] [id number] [place] [phone]  If symptoms worsen      Courtney Paris, MD 07/22/16 2216

## 2016-10-08 ENCOUNTER — Emergency Department (HOSPITAL_COMMUNITY)
Admission: EM | Admit: 2016-10-08 | Discharge: 2016-10-08 | Disposition: A | Discharge status: Home / Self Care | Payer: Medicaid Other | Attending: Emergency Medicine | Admitting: Emergency Medicine

## 2016-10-08 ENCOUNTER — Encounter (HOSPITAL_COMMUNITY): Payer: Self-pay | Admitting: Emergency Medicine

## 2016-10-08 DIAGNOSIS — Z79899 Other long term (current) drug therapy: Secondary | ICD-10-CM | POA: Insufficient documentation

## 2016-10-08 DIAGNOSIS — R112 Nausea with vomiting, unspecified: Secondary | ICD-10-CM | POA: Insufficient documentation

## 2016-10-08 DIAGNOSIS — E119 Type 2 diabetes mellitus without complications: Secondary | ICD-10-CM | POA: Diagnosis not present

## 2016-10-08 DIAGNOSIS — R197 Diarrhea, unspecified: Secondary | ICD-10-CM | POA: Diagnosis not present

## 2016-10-08 LAB — URINALYSIS, ROUTINE W REFLEX MICROSCOPIC
Bilirubin Urine: NEGATIVE
Glucose, UA: NEGATIVE mg/dL
HGB URINE DIPSTICK: NEGATIVE
Ketones, ur: NEGATIVE mg/dL
LEUKOCYTES UA: NEGATIVE
NITRITE: NEGATIVE
Protein, ur: NEGATIVE mg/dL
pH: 5.5 (ref 5.0–8.0)

## 2016-10-08 LAB — COMPREHENSIVE METABOLIC PANEL
ALT: 63 U/L — AB (ref 14–54)
ANION GAP: 8 (ref 5–15)
AST: 63 U/L — AB (ref 15–41)
Albumin: 4.1 g/dL (ref 3.5–5.0)
Alkaline Phosphatase: 40 U/L (ref 38–126)
BUN: 9 mg/dL (ref 6–20)
CHLORIDE: 105 mmol/L (ref 101–111)
CO2: 27 mmol/L (ref 22–32)
Calcium: 9.4 mg/dL (ref 8.9–10.3)
Creatinine, Ser: 0.79 mg/dL (ref 0.44–1.00)
GFR calc Af Amer: >60 mL/min (ref >60)
Glucose, Bld: 91 mg/dL (ref 65–99)
POTASSIUM: 3.9 mmol/L (ref 3.5–5.1)
Sodium: 140 mmol/L (ref 135–145)
Total Bilirubin: 1.2 mg/dL (ref 0.3–1.2)
Total Protein: 7.4 g/dL (ref 6.5–8.1)

## 2016-10-08 LAB — URINALYSIS, MICROSCOPIC (REFLEX): RBC / HPF: NONE SEEN RBC/hpf (ref 0–5)

## 2016-10-08 LAB — CBC
HEMATOCRIT: 42.2 % (ref 36.0–46.0)
Hemoglobin: 13.9 g/dL (ref 12.0–15.0)
MCH: 29.3 pg (ref 26.0–34.0)
MCHC: 32.9 g/dL (ref 30.0–36.0)
MCV: 88.8 fL (ref 78.0–100.0)
Platelets: 135 10*3/uL — ABNORMAL LOW (ref 150–400)
RBC: 4.75 MIL/uL (ref 3.87–5.11)
RDW: 13.2 % (ref 11.5–15.5)
WBC: 3.2 10*3/uL — AB (ref 4.0–10.5)

## 2016-10-08 LAB — PREGNANCY, URINE: Preg Test, Ur: NEGATIVE

## 2016-10-08 LAB — LIPASE, BLOOD: LIPASE: 27 U/L (ref 11–51)

## 2016-10-08 MED ORDER — SODIUM CHLORIDE 0.9 % IV BOLUS (SEPSIS)
500.0000 mL | Freq: Once | INTRAVENOUS | Status: AC
  Administered 2016-10-08: 500 mL via INTRAVENOUS

## 2016-10-08 MED ORDER — ONDANSETRON HCL 4 MG PO TABS: 4.0000 mg | ORAL_TABLET | Freq: Three times a day (TID) | ORAL | 0 refills | Status: DC | PRN

## 2016-10-08 NOTE — Discharge Instructions (Signed)
Please drink plenty of fluids and stay well-hydrated to keep your urine clear. Follow-up with your primary care provider for recheck of liver enzymes. Return to the emergency department if you experience worsening condition continued vomiting despite medication, fever, chills, abdominal pain or any other new concerning symptoms.

## 2016-10-08 NOTE — ED Notes (Signed)
Pt given crackers and po fluids

## 2016-10-08 NOTE — ED Triage Notes (Signed)
Pt sts N/V/D starting this am

## 2016-10-08 NOTE — ED Provider Notes (Signed)
[facility] DEPT Provider Note   CSN: [id number] Arrival date & time: 10/08/16  0957     History   Chief Complaint Chief Complaint  Patient presents with  . Emesis  . Diarrhea    HPI Brenda Cannon is a 36 y.o. female presenting with nausea, dry heaving, nonbloody watery diarrhea since 3 AM this morning. Associated chills. She states that she feels weak from all the try heaving and diarrhea and hasn't had anything to eat or drink since. She went to work this morning and continued to have diarrhea so she came to the emergency department right away. Last LMP approximately 09/28/16. She denies abdominal pain, dysuria, hematuria, melena, blood in her stool.  HPI  Past Medical History:  Diagnosis Date  . Abnormal Pap smear    ascus with High Risk for HPV  . De Quervain's tenosynovitis, left   . Difficulty reading   . Gestational diabetes    GDM with last pregnancy (2008)  . History of gestational diabetes   . History of hepatitis B 2007  . History of malaria 2003  . History of thrombocytopenia   . Obese     Patient Active Problem List   Diagnosis Date Noted  . Diabetes mellitus type 2 in obese ([place]) 03/24/2015  . Metatarsal stress fracture of left foot 11/10/2013  . Allergic rhinitis 05/26/2012  . 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 History of malaria 03/04/2011  . Hepatitis B infection 03/01/2011  . H/O: C-section 03/01/2011  . History of thrombocytopenia 03/01/2011    Past Surgical History:  Procedure Laterality Date  . CESAREAN SECTION  2008   last pregnacy  . CESAREAN SECTION  08/21/2011   Procedure: CESAREAN SECTION;  Surgeon: Donnamae Jude, MD;  Location: [facility];  Service: Gynecology;  Laterality: N/A;    OB History    Gravida Para Term Preterm AB Living   5 3 3  0 2 3   SAB TAB Ectopic Multiple Live Births   1   0 0 3       Home Medications    Prior to Admission medications   Medication Sig Start Date End Date Taking? Authorizing  Provider  amoxicillin-clavulanate (AUGMENTIN) 875-125 MG tablet Take 1 tablet by mouth 2 (two) times daily. Patient not taking: Reported on 10/08/2016 07/15/16   Melony Overly, MD  doxycycline (VIBRAMYCIN) 100 MG capsule Take 1 capsule (100 mg total) by mouth 2 (two) times daily. Patient not taking: Reported on 10/08/2016 07/22/16   Gwenyth Allegra Tegeler, MD  ondansetron (ZOFRAN) 4 MG tablet Take 1 tablet (4 mg total) by mouth every 8 (eight) hours as needed for nausea or vomiting. 10/08/16   Emeline General, PA-C  predniSONE (DELTASONE) 50 MG tablet Take 1 pill daily for 5 days. Patient not taking: Reported on 10/08/2016 07/15/16   Melony Overly, MD  ranitidine (ZANTAC) 150 MG tablet Take 1 tablet (150 mg total) by mouth 2 (two) times daily. Patient not taking: Reported on 10/08/2016 07/22/16   Courtney Paris, MD    Family History Family History  Problem Relation Age of Onset  . Hypertension Mother   . Stroke Mother     Social History Social History  Substance Use Topics  . Smoking status: Never Smoker  . Smokeless tobacco: Never Used  . Alcohol use No     Allergies   Cephalexin and Chloroquine   Review of Systems Review of Systems  Constitutional: Positive for chills and fatigue.  Negative for fever.  HENT: Negative for congestion, ear pain, sore throat and trouble swallowing.   Eyes: Negative for pain and visual disturbance.  Respiratory: Negative for cough, choking, chest tightness, shortness of breath, wheezing and stridor.   Cardiovascular: Negative for chest pain, palpitations and leg swelling.  Gastrointestinal: Positive for diarrhea, nausea and vomiting. Negative for abdominal distention, abdominal pain and blood in stool.  Genitourinary: Negative for difficulty urinating, dysuria, flank pain, frequency, hematuria and pelvic pain.  Musculoskeletal: Negative for arthralgias, back pain, neck pain and neck stiffness.  Skin: Negative for color change, pallor and rash.    Neurological: Negative for seizures, syncope, speech difficulty, numbness and headaches.  All other systems reviewed and are negative.    Physical Exam Updated Vital Signs BP 111/73   Pulse (!) 57   Temp 98.4 F (36.9 C) (Oral)   Resp 16   SpO2 100%   Physical Exam  Constitutional: She is oriented to person, place, and time. She appears well-developed and well-nourished. No distress.  Patient is afebrile, nontoxic-appearing, lying comfortably in bed in no acute distress.  HENT:  Head: Normocephalic and atraumatic.  Mouth/Throat: No oropharyngeal exudate.  Oral mucosa appears slightly dry  Eyes: Conjunctivae and EOM are normal.  Neck: Normal range of motion. Neck supple.  Cardiovascular: Normal rate, regular rhythm and normal heart sounds.   No murmur heard. Pulmonary/Chest: Effort normal and breath sounds normal. No respiratory distress. She has no wheezes. She has no rales. She exhibits no tenderness.  Abdominal: Soft. Bowel sounds are normal. She exhibits no distension and no mass. There is no tenderness. There is no rebound and no guarding.  Abdomen is nontender to palpation. Negative Murphy sign, negative McBurney's point tenderness, negative rebound. No peritoneal signs.  Musculoskeletal: Normal range of motion. She exhibits no edema.  Neurological: She is alert and oriented to person, place, and time.  Observed walking to the restroom normal stance and gait.  Skin: Skin is warm and dry. She is not diaphoretic. No erythema. No pallor.  Psychiatric: She has a normal mood and affect.  Nursing note and vitals reviewed.    ED Treatments / Results  Labs (all labs ordered are listed, but only abnormal results are displayed) Labs Reviewed  CBC - Abnormal; Notable for the following:       Result Value   WBC 3.2 (*)    Platelets 135 (*)    All other components within normal limits  COMPREHENSIVE METABOLIC PANEL - Abnormal; Notable for the following:    AST 63 (*)    ALT  63 (*)    All other components within normal limits  PREGNANCY, URINE  LIPASE, BLOOD  URINALYSIS, ROUTINE W REFLEX MICROSCOPIC    EKG  EKG Interpretation None       Radiology No results found.  Procedures Procedures (including critical care time)  Medications Ordered in ED Medications  sodium chloride 0.9 % bolus 500 mL (0 mLs Intravenous Stopped 10/08/16 1527)     Initial Impression / Assessment and Plan / ED Course  I have reviewed the triage vital signs and the nursing notes.  Pertinent labs & imaging results that were available during my care of the patient were reviewed by me and considered in my medical decision making (see chart for details).     Patient presents with 9 hours onset of nausea/vomiting/diarrhea. She appeared mildly dry and given IV fluids.  Exam is unremarkable, she is nontender to palpation of the abdomen and doesn't report  any pain.  Labs unremarkable other than slightly elevated liver function. Asked  to follow up with primary care provider to recheck.  On reassessment, she reported improvement. Discharge home with anti-medic and close follow-up with her primary care.  Discussed strict return precautions and advised to return to the emergency department if experiencing any new or worsening symptoms. Instructions were understood and patient agreed with discharge plan.   Final Clinical Impressions(s) / ED Diagnoses   Final diagnoses:  Nausea vomiting and diarrhea    New Prescriptions New Prescriptions   ONDANSETRON (ZOFRAN) 4 MG TABLET    Take 1 tablet (4 mg total) by mouth every 8 (eight) hours as needed for nausea or vomiting.     Emeline General, PA-C 10/08/16 [place], MD 10/09/16 [phone]

## 2016-11-18 ENCOUNTER — Ambulatory Visit: Payer: Medicaid Other | Admitting: Internal Medicine

## 2017-01-13 ENCOUNTER — Encounter (HOSPITAL_COMMUNITY): Payer: Self-pay | Admitting: Emergency Medicine

## 2017-01-13 ENCOUNTER — Ambulatory Visit (HOSPITAL_COMMUNITY)
Admission: EM | Admit: 2017-01-13 | Discharge: 2017-01-13 | Disposition: A | Discharge status: Home / Self Care | Payer: Medicaid Other | Attending: Internal Medicine | Admitting: Internal Medicine

## 2017-01-13 DIAGNOSIS — M545 Low back pain, unspecified: Secondary | ICD-10-CM

## 2017-01-13 DIAGNOSIS — Z3202 Encounter for pregnancy test, result negative: Secondary | ICD-10-CM

## 2017-01-13 LAB — POCT URINALYSIS DIP (DEVICE)
BILIRUBIN URINE: NEGATIVE
GLUCOSE, UA: NEGATIVE mg/dL
KETONES UR: NEGATIVE mg/dL
LEUKOCYTES UA: NEGATIVE
Nitrite: NEGATIVE
Protein, ur: NEGATIVE mg/dL
SPECIFIC GRAVITY, URINE: 1.025 (ref 1.005–1.030)
UROBILINOGEN UA: 0.2 mg/dL (ref 0.0–1.0)
pH: 6 (ref 5.0–8.0)

## 2017-01-13 LAB — POCT PREGNANCY, URINE: Preg Test, Ur: NEGATIVE

## 2017-01-13 MED ORDER — DICLOFENAC SODIUM 75 MG PO TBEC
75.0000 mg | DELAYED_RELEASE_TABLET | Freq: Two times a day (BID) | ORAL | 0 refills | Status: DC
Start: 2017-01-13 — End: 2018-04-29

## 2017-01-13 MED ORDER — KETOROLAC TROMETHAMINE 30 MG/ML IJ SOLN
INTRAMUSCULAR | Status: AC
  Filled 2017-01-13: qty 1

## 2017-01-13 MED ORDER — CYCLOBENZAPRINE HCL 10 MG PO TABS: 10.0000 mg | ORAL_TABLET | Freq: Two times a day (BID) | ORAL | 0 refills | Status: DC | PRN

## 2017-01-13 MED ORDER — KETOROLAC TROMETHAMINE 30 MG/ML IJ SOLN
30.0000 mg | Freq: Once | INTRAMUSCULAR | Status: AC
  Administered 2017-01-13: 30 mg via INTRAMUSCULAR

## 2017-01-13 NOTE — ED Notes (Signed)
Sent patient to the bathroom for a dirty and clean urine specimen

## 2017-01-13 NOTE — Discharge Instructions (Signed)
You most likely have a strained muscle in your lower back. I have prescribed two medicines for your pain. The first is diclofenac, take 1 tablet twice a day and the other is Flexeril, take 1 tablet twice a day. Flexeril may cause drowsiness so do not drive until you know how this medicine affects you. Also do not drink any alcohol either. You may apply ice and alternate with heat for 15 minutes at a time 4 times daily and for additional pain control you may take tylenol over the counter ever 4 hours but do not take more than 4000 mg a day. Should your pain continue or fail to resolve, follow up with your primary care provider or return to clinic as needed.

## 2017-01-13 NOTE — ED Provider Notes (Signed)
CSN: [id number]     Arrival date & time 01/13/17  1011 History   None    Chief Complaint  Patient presents with  . Back Pain   (Consider location/radiation/quality/duration/timing/severity/associated sxs/prior Treatment) The history is provided by the patient.  Back Pain  Location:  Lumbar spine Quality:  Aching Radiates to:  Does not radiate Pain severity:  Moderate Pain is:  Same all the time Onset quality:  Gradual Duration:  3 weeks Timing:  Constant Progression:  Unchanged Chronicity:  New Context: not emotional stress, not falling, not jumping from heights, not MCA, not MVA, not occupational injury, not recent illness and not twisting   Relieved by:  Nothing Worsened by:  Bending and movement Ineffective treatments:  OTC medications Associated symptoms: no abdominal pain, no abdominal swelling, no bladder incontinence, no bowel incontinence, no fever, no tingling and no weight loss     Past Medical History:  Diagnosis Date  . Abnormal Pap smear    ascus with High Risk for HPV  . De Quervain's tenosynovitis, left   . Difficulty reading   . Gestational diabetes    GDM with last pregnancy (2008)  . History of gestational diabetes   . History of hepatitis B 2007  . History of malaria 2003  . History of thrombocytopenia   . Obese    Past Surgical History:  Procedure Laterality Date  . CESAREAN SECTION  2008   last pregnacy  . CESAREAN SECTION  08/21/2011   Procedure: CESAREAN SECTION;  Surgeon: Donnamae Jude, MD;  Location: [facility];  Service: Gynecology;  Laterality: N/A;   Family History  Problem Relation Age of Onset  . Hypertension Mother   . Stroke Mother    Social History  Substance Use Topics  . Smoking status: Never Smoker  . Smokeless tobacco: Never Used  . Alcohol use No   OB History    Gravida Para Term Preterm AB Living   5 3 3  0 2 3   SAB TAB Ectopic Multiple Live Births   1   0 0 3     Review of Systems  Constitutional: Negative for  chills, fever and weight loss.  HENT: Negative.   Respiratory: Negative.   Cardiovascular: Negative.   Gastrointestinal: Negative for abdominal pain, bowel incontinence, constipation, nausea and vomiting.  Genitourinary: Negative.  Negative for bladder incontinence.  Musculoskeletal: Positive for back pain.  Skin: Negative.   Neurological: Negative.  Negative for tingling.    Allergies  Cephalexin and Chloroquine  Home Medications   Prior to Admission medications   Medication Sig Start Date End Date Taking? Authorizing Provider  ibuprofen (ADVIL,MOTRIN) 200 MG tablet Take 200 mg by mouth every 6 (six) hours as needed.   Yes [provider]  cyclobenzaprine (FLEXERIL) 10 MG tablet Take 1 tablet (10 mg total) by mouth 2 (two) times daily as needed for muscle spasms. 01/13/17   Barnet Glasgow, NP  diclofenac (VOLTAREN) 75 MG EC tablet Take 1 tablet (75 mg total) by mouth 2 (two) times daily. 01/13/17   Barnet Glasgow, NP  ondansetron (ZOFRAN) 4 MG tablet Take 1 tablet (4 mg total) by mouth every 8 (eight) hours as needed for nausea or vomiting. 10/08/16   Emeline General, PA-C   Meds Ordered and Administered this Visit   Medications  ketorolac (TORADOL) 30 MG/ML injection 30 mg (not administered)    BP 121/80 (BP Location: Right Arm)   Pulse 60   Temp 98.1 F (36.7 C) (Oral)  Resp 14   LMP 01/06/2017   SpO2 100%  No data found.   Physical Exam  Constitutional: Brenda Cannon is oriented to person, place, and time. Brenda Cannon appears well-developed and well-nourished. No distress.  HENT:  Head: Normocephalic and atraumatic.  Right Ear: External ear normal.  Left Ear: External ear normal.  Eyes: Conjunctivae are normal.  Neck: Normal range of motion.  Cardiovascular: Normal rate and regular rhythm.   Pulmonary/Chest: Effort normal and breath sounds normal.  Neurological: Brenda Cannon is alert and oriented to person, place, and time. Brenda Cannon displays normal reflexes.  Skin: Skin is warm  and dry. Capillary refill takes less than 2 seconds. No rash noted. Brenda Cannon is not diaphoretic. No erythema.  Psychiatric: Brenda Cannon has a normal mood and affect. Her behavior is normal.  Nursing note and vitals reviewed.   Urgent Care Course     Procedures (including critical care time)  Labs Review Labs Reviewed  POCT URINALYSIS DIP (DEVICE) - Abnormal; Notable for the following:       Result Value   Hgb urine dipstick MODERATE (*)    All other components within normal limits  POCT PREGNANCY, URINE    Imaging Review No results found.  MDM   1. Acute midline low back pain without sciatica     Brenda Cannon is a 36 y.o. female with a 3 week history of back pain.  Differential diagnosis for back pain includes muscle spasm/strain, slipped disc with radicular pain including sciatica, slipped disc with cauda equina syndrome, vertebral fracture, vertebral tumor, epidural abscess/discitis, or pyelonephritis.  Based on history, and physical exam, most likely etiology of this patient's muscle back pain is muscle strain/sprain muscle spasms/strain. It could be due to slipped disc. Emergent MRI is not indicated as this patient does not have new weakness, or cauda equina syndrome. Patient has no bowel or bladder incontinence.  I do not believe a fracture to be the source of patient's pain as there was no preceding trauma.  Based on history, and physical, imaging of the spine was not done.  I do not believe the pain is caused by epidural abscess as the patient denies history of IV drug use, and does not have fever/chills and no recent procedures.  Vertebral tumor is unlikely as the patient does not have weight loss or night sweats, and no history of cancer.  I do not believe this patient's pain represents a ruptured AAA. There is nopalpable, pulsatile mass on the exam and patient does not have any abdominal pain.     Barnet Glasgow, NP 01/13/17 1131

## 2017-01-13 NOTE — ED Triage Notes (Signed)
Patient has back pain for 3 weeks ago, no known injury.  Pain with movement.

## 2017-01-15 ENCOUNTER — Ambulatory Visit: Payer: Medicaid Other

## 2017-03-25 ENCOUNTER — Ambulatory Visit (HOSPITAL_COMMUNITY)
Admission: EM | Admit: 2017-03-25 | Discharge: 2017-03-25 | Disposition: A | Discharge status: Home / Self Care | Payer: Self-pay | Attending: Family Medicine | Admitting: Family Medicine

## 2017-03-25 ENCOUNTER — Encounter (HOSPITAL_COMMUNITY): Payer: Self-pay | Admitting: Emergency Medicine

## 2017-03-25 DIAGNOSIS — M7750 Other enthesopathy of unspecified foot: Secondary | ICD-10-CM

## 2017-03-25 DIAGNOSIS — G44201 Tension-type headache, unspecified, intractable: Secondary | ICD-10-CM

## 2017-03-25 MED ORDER — BUTALBITAL-APAP-CAFFEINE 50-325-40 MG PO TABS: 1.0000 | ORAL_TABLET | Freq: Four times a day (QID) | ORAL | 0 refills | Status: AC | PRN

## 2017-03-25 MED ORDER — MELOXICAM 7.5 MG PO TABS: 7.5000 mg | ORAL_TABLET | Freq: Every day | ORAL | 1 refills | Status: DC

## 2017-03-25 NOTE — ED Triage Notes (Signed)
Complains of headache that started 2 days ago.  Denies runny nose or cough  Left foot pain for a month.  No known injury.  Pain when walking

## 2017-03-25 NOTE — Discharge Instructions (Signed)
Get an arch support insert for your shoes to help with the tendonitis in the left foot

## 2017-03-25 NOTE — ED Provider Notes (Signed)
[facility]   [id number] 03/25/17 Arrival Time: 1027   SUBJECTIVE:  Brenda Cannon is a 36 y.o. female who presents to the urgent care with complaint of headache that started 2 days ago.  Denies runny nose or cough or head injury. No fever, nausea, change in vision, change in hearing or swallowing problem  Left foot pain for a month.  No known injury.  Pain when walking     Past Medical History:  Diagnosis Date  . Abnormal Pap smear    ascus with High Risk for HPV  . De Quervain's tenosynovitis, left   . Difficulty reading   . Gestational diabetes    GDM with last pregnancy (2008)  . History of gestational diabetes   . History of hepatitis B 2007  . History of malaria 2003  . History of thrombocytopenia   . Obese    Family History  Problem Relation Age of Onset  . Hypertension Mother   . Stroke Mother    Social History   Social History  . Marital status: Single    Spouse name: N/A  . Number of children: N/A  . Years of education: N/A   Occupational History  . Not on file.   Social History Main Topics  . Smoking status: Never Smoker  . Smokeless tobacco: Never Used  . Alcohol use No  . Drug use: No  . Sexual activity: Not on file   Other Topics Concern  . Not on file   Social History Narrative   ** Merged History Encounter **       No outpatient prescriptions have been marked as taking for the 03/25/17 encounter [facility] Encounter).   Allergies  Allergen Reactions  . Cephalexin Itching and Rash  . Chloroquine Hives      ROS: As per HPI, remainder of ROS negative.   OBJECTIVE:   Vitals:   03/25/17 1100  BP: 109/66  Pulse: 65  Resp: 18  Temp: 98.6 F (37 C)  TempSrc: Oral  SpO2: 97%     General appearance: alert; no distress Eyes: PERRL; EOMI; conjunctiva normal HENT: normocephalic; atraumatic; TMs normal, canal normal, external ears normal without trauma; nasal mucosa normal; oral mucosa normal Neck: supple Lungs:  clear to auscultation bilaterally Heart: regular rate and rhythm Back: no CVA tenderness Extremities: no cyanosis or edema; symmetrical with no gross deformities; tender left dorsal foot without swelling or erythema Skin: warm and dry Neurologic: normal gait; grossly normal Psychological: alert and cooperative; normal mood and affect      Labs:  Results for orders placed or performed during the hospital encounter of 01/13/17  POCT urinalysis dip (device)  Result Value Ref Range   Glucose, UA NEGATIVE NEGATIVE mg/dL   Bilirubin Urine NEGATIVE NEGATIVE   Ketones, ur NEGATIVE NEGATIVE mg/dL   Specific Gravity, Urine 1.025 1.005 - 1.030   Hgb urine dipstick MODERATE (A) NEGATIVE   pH 6.0 5.0 - 8.0   Protein, ur NEGATIVE NEGATIVE mg/dL   Urobilinogen, UA 0.2 0.0 - 1.0 mg/dL   Nitrite NEGATIVE NEGATIVE   Leukocytes, UA NEGATIVE NEGATIVE  Pregnancy, urine POC  Result Value Ref Range   Preg Test, Ur NEGATIVE NEGATIVE    Labs Reviewed - No data to display  No results found.     ASSESSMENT & PLAN:  1. Acute intractable tension-type headache   2. Tendonitis of foot     Meds ordered this encounter  Medications  . butalbital-acetaminophen-caffeine (FIORICET, ESGIC) 50-325-40 MG tablet  Sig: Take 1-2 tablets by mouth every 6 (six) hours as needed for headache.    Dispense:  20 tablet    Refill:  0  . meloxicam (MOBIC) 7.5 MG tablet    Sig: Take 1 tablet (7.5 mg total) by mouth daily.    Dispense:  10 tablet    Refill:  1    Reviewed expectations re: course of current medical issues. Questions answered. Outlined signs and symptoms indicating need for more acute intervention. Patient verbalized understanding. After Visit Summary given.    Procedures:      Robyn Haber, MD 03/25/17 1133

## 2018-03-19 ENCOUNTER — Encounter (HOSPITAL_COMMUNITY): Payer: Self-pay | Admitting: *Deleted

## 2018-03-19 ENCOUNTER — Emergency Department (HOSPITAL_COMMUNITY)
Admission: EM | Admit: 2018-03-19 | Discharge: 2018-03-19 | Disposition: A | Discharge status: Home / Self Care | Payer: Worker's Compensation | Attending: Emergency Medicine | Admitting: Emergency Medicine

## 2018-03-19 ENCOUNTER — Other Ambulatory Visit: Payer: Self-pay

## 2018-03-19 DIAGNOSIS — Z79899 Other long term (current) drug therapy: Secondary | ICD-10-CM | POA: Diagnosis not present

## 2018-03-19 DIAGNOSIS — Z77098 Contact with and (suspected) exposure to other hazardous, chiefly nonmedicinal, chemicals: Secondary | ICD-10-CM

## 2018-03-19 DIAGNOSIS — E119 Type 2 diabetes mellitus without complications: Secondary | ICD-10-CM | POA: Diagnosis not present

## 2018-03-19 DIAGNOSIS — Z77028 Contact with and (suspected) exposure to other hazardous aromatic compounds: Secondary | ICD-10-CM | POA: Insufficient documentation

## 2018-03-19 DIAGNOSIS — R42 Dizziness and giddiness: Secondary | ICD-10-CM | POA: Diagnosis present

## 2018-03-19 NOTE — ED Notes (Signed)
Pt signed out AMA, she understands to come back or call 911 if she has a problem

## 2018-03-19 NOTE — Discharge Instructions (Addendum)
Please read attached information.  You are leaving [facility].  We advised that you stay for thorough evaluation as we are uncertain at this time the cause of your symptoms.  Please return to the emergency room at any time for further evaluation and management.

## 2018-03-19 NOTE — ED Provider Notes (Signed)
[facility] EMERGENCY DEPARTMENT Provider Note   CSN: [id number] Arrival date & time: 03/19/18  1324   History   Chief Complaint Chief Complaint  Patient presents with  . Chemical Exposure    HPI Brenda Cannon is a 37 y.o. female.  HPI    37 year old female presents today with complaints of chemical exposure.  Patient notes she was working at [employer] this morning.  She notes she smelled a burning smell.  She developed nausea, dizziness, and headache.  She denies any associated neurological deficits, fever, neck stiffness.  No episodes of vomiting.  She denies any abdominal pain.  Patient denies any chest pain or shortness of breath.   Past Medical History:  Diagnosis Date  . Abnormal Pap smear    ascus with High Risk for HPV  . De Quervain's tenosynovitis, left   . Difficulty reading   . Gestational diabetes    GDM with last pregnancy (2008)  . History of gestational diabetes   . History of hepatitis B 2007  . History of malaria 2003  . History of thrombocytopenia   . Obese     Patient Active Problem List   Diagnosis Date Noted  . Diabetes mellitus type 2 in obese ([place]) 03/24/2015  . Metatarsal stress fracture of left foot 11/10/2013  . Allergic rhinitis 05/26/2012  . 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 History of malaria 03/04/2011  . Hepatitis B infection 03/01/2011  . H/O: C-section 03/01/2011  . History of thrombocytopenia 03/01/2011    Past Surgical History:  Procedure Laterality Date  . CESAREAN SECTION  2008   last pregnacy  . CESAREAN SECTION  08/21/2011   Procedure: CESAREAN SECTION;  Surgeon: Donnamae Jude, MD;  Location: [facility];  Service: Gynecology;  Laterality: N/A;     OB History    Gravida  5   Para  3   Term  3   Preterm  0   AB  2   Living  3     SAB  1   TAB      Ectopic  0   Multiple  0   Live Births  3            Home Medications    Prior to Admission medications    Medication Sig Start Date End Date Taking? Authorizing Provider  butalbital-acetaminophen-caffeine (FIORICET, ESGIC) 50-325-40 MG tablet Take 1-2 tablets by mouth every 6 (six) hours as needed for headache. 03/25/17 03/25/18  Robyn Haber, MD  cyclobenzaprine (FLEXERIL) 10 MG tablet Take 1 tablet (10 mg total) by mouth 2 (two) times daily as needed for muscle spasms. 01/13/17   Barnet Glasgow, NP  diclofenac (VOLTAREN) 75 MG EC tablet Take 1 tablet (75 mg total) by mouth 2 (two) times daily. 01/13/17   Barnet Glasgow, NP  ibuprofen (ADVIL,MOTRIN) 200 MG tablet Take 200 mg by mouth every 6 (six) hours as needed.    [provider]  meloxicam (MOBIC) 7.5 MG tablet Take 1 tablet (7.5 mg total) by mouth daily. 03/25/17   Robyn Haber, MD  ondansetron (ZOFRAN) 4 MG tablet Take 1 tablet (4 mg total) by mouth every 8 (eight) hours as needed for nausea or vomiting. 10/08/16   Emeline General, PA-C    Family History Family History  Problem Relation Age of Onset  . Hypertension Mother   . Stroke Mother     Social History Social History   Tobacco Use  .  Smoking status: Never Smoker  . Smokeless tobacco: Never Used  Substance Use Topics  . Alcohol use: No  . Drug use: No     Allergies   Cephalexin and Chloroquine   Review of Systems Review of Systems  All other systems reviewed and are negative.  Physical Exam Updated Vital Signs BP 125/78   Pulse (!) 58   Resp (!) 21   Ht 5\' 1"  (1.549 m)   Wt 72.6 kg   LMP 03/08/2018   SpO2 100%   BMI 30.23 kg/m   Physical Exam  Constitutional: She is oriented to person, place, and time. She appears well-developed and well-nourished.  HENT:  Head: Normocephalic and atraumatic.  Eyes: Pupils are equal, round, and reactive to light. Conjunctivae are normal. Right eye exhibits no discharge. Left eye exhibits no discharge. No scleral icterus.  Neck: Normal range of motion. No JVD present. No tracheal deviation present.    Cardiovascular: Normal rate, regular rhythm, normal heart sounds and intact distal pulses. Exam reveals no gallop and no friction rub.  No murmur heard. Pulmonary/Chest: Effort normal and breath sounds normal. No stridor. No respiratory distress. She has no wheezes. She has no rales. She exhibits no tenderness.  Neurological: She is alert and oriented to person, place, and time. No cranial nerve deficit or sensory deficit. Coordination normal. GCS eye subscore is 4. GCS verbal subscore is 5. GCS motor subscore is 6.  Psychiatric: She has a normal mood and affect. Her behavior is normal. Judgment and thought content normal.  Nursing note and vitals reviewed.    ED Treatments / Results  Labs (all labs ordered are listed, but only abnormal results are displayed) Labs Reviewed  CBC WITH DIFFERENTIAL/PLATELET  BASIC METABOLIC PANEL  COOXEMETRY PANEL  I-STAT ARTERIAL BLOOD GAS, ED    EKG None  Radiology No results found.  Procedures Procedures (including critical care time)  Medications Ordered in ED Medications - No data to display   Initial Impression / Assessment and Plan / ED Course  I have reviewed the triage vital signs and the nursing notes.  Pertinent labs & imaging results that were available during my care of the patient were reviewed by me and considered in my medical decision making (see chart for details).     Labs:   Imaging:  Consults:  Therapeutics:  Discharge Meds:   Assessment/Plan: 37 year old female presents today with potential chemical exposure.  Patient requesting discharge prior to thorough evaluation.  At the time patient's discharge I am uncertain of the chemical exposure, she is refusing laboratory analysis that she has to go pick up her daughter.  She understands that leaving [place] is not advised and potentially could be life-threatening.  Patient encouraged to return at any point for repeat evaluation.  She reports she is  asymptomatic has no complaints.  Patient discharged.  Final Clinical Impressions(s) / ED Diagnoses   Final diagnoses:  Chemical exposure    ED Discharge Orders    None       Francee Gentile 03/19/18 1516    Duffy Bruce, MD 03/22/18 [phone]

## 2018-03-19 NOTE — ED Notes (Signed)
Pt states that she can not stay as she needs to pick up her child.  No SOB at this time

## 2018-03-19 NOTE — ED Triage Notes (Signed)
Pt arrived via EMS from place of work due to a [employer]. Pt reports feeling dizzy . HA  And nausea. Pt denies any changes  with vision at this time. Pt reports she can smele the odor from work.  PT AO no resp distress noted . O2 sats on room air are 100%.

## 2018-04-29 ENCOUNTER — Other Ambulatory Visit: Payer: Self-pay

## 2018-04-29 ENCOUNTER — Encounter (HOSPITAL_COMMUNITY): Payer: Self-pay | Admitting: Emergency Medicine

## 2018-04-29 ENCOUNTER — Ambulatory Visit (HOSPITAL_COMMUNITY)
Admission: EM | Admit: 2018-04-29 | Discharge: 2018-04-29 | Disposition: A | Discharge status: Home / Self Care | Payer: Medicaid Other | Attending: Family Medicine | Admitting: Family Medicine

## 2018-04-29 DIAGNOSIS — M542 Cervicalgia: Secondary | ICD-10-CM

## 2018-04-29 DIAGNOSIS — R0789 Other chest pain: Secondary | ICD-10-CM

## 2018-04-29 MED ORDER — CYCLOBENZAPRINE HCL 10 MG PO TABS: ORAL_TABLET | ORAL | 0 refills | Status: DC

## 2018-04-29 MED ORDER — DICLOFENAC SODIUM 75 MG PO TBEC: 75.0000 mg | DELAYED_RELEASE_TABLET | Freq: Two times a day (BID) | ORAL | 0 refills | Status: DC

## 2018-04-29 NOTE — ED Triage Notes (Addendum)
Pt has been having intermittent left neck pain that radiates into her left shoulder x1 month.  Last night she states the pain radiated into her central chest with associated SOB and left ear and jaw pain. She also reports left arm numbness last night.  She describes the pain as pressure and sharp.

## 2018-05-02 NOTE — ED Provider Notes (Signed)
[place]   [id number] 04/29/18 Arrival Time: 0177  ASSESSMENT & PLAN:  1. Neck pain on left side   2. Chest wall pain    Suspect this is all MSK related. Discussed.  Meds ordered this encounter  Medications  . cyclobenzaprine (FLEXERIL) 10 MG tablet    Sig: Take 1 tablet by mouth 3 times daily as needed for muscle spasm. Warning: May cause drowsiness.    Dispense:  21 tablet    Refill:  0  . diclofenac (VOLTAREN) 75 MG EC tablet    Sig: Take 1 tablet (75 mg total) by mouth 2 (two) times daily.    Dispense:  14 tablet    Refill:  0    Orders Placed This Encounter  Procedures  . ED EKG  No worrisome abnormalities seen.  Follow-up Information    [place].   Specialty:  Emergency Medicine Why:  If symptoms worsen. Contact information: [address] [id number] [place] [phone]         Medication sedation precautions. Expect gradual improvement. Encouraged ROM as she tolerates.  Reviewed expectations re: course of current medical issues. Questions answered. Outlined signs and symptoms indicating need for more acute intervention. Patient verbalized understanding. After Visit Summary given.  SUBJECTIVE: History from: patient. Brenda Cannon is a 37 y.o. female who reports mild to moderate pain of her left neck discomfort; described as aching with occasional sharp exacerbations; with occasional radiation to her L upper back and to her anterior upper L chest wall. "Sometimes I think my jaw hurts to" with sharp exacerbation. Currently with only neck discomfort. Injury/trama: no. Onset: gradual, about a month ago. Symptoms have been intermittent since beginning. Sometimes neck discomfort last for several hours. Relieved by: nothing in particular. Worsened by: certain movements. Associated symptoms: none reported. Extremity sensation changes or weakness: none. Self  treatment: tried OTCs without relief of pain. History of similar: no. No SOB or respiratory difficulties reported.  Social History   Tobacco Use  Smoking Status Never Smoker  Smokeless Tobacco Never Used    Past Surgical History:  Procedure Laterality Date  . CESAREAN SECTION  2008   last pregnacy  . CESAREAN SECTION  08/21/2011   Procedure: CESAREAN SECTION;  Surgeon: Donnamae Jude, MD;  Location: [facility];  Service: Gynecology;  Laterality: N/A;     ROS: As per HPI. All other systems negative.    OBJECTIVE:  Vitals:   04/29/18 1313  BP: 126/86  Pulse: 74  Temp: 97.9 F (36.6 C)  TempSrc: Oral  SpO2: 99%    General appearance: alert; no distress HEENT: [place]; atraumatic; neck with TTP over left posterior musculature extending over trapezius distribution; no midline tenderness; neck with FROM but with some discomfort reported Extremities: normal ROM of upper extremities; no LE edema CV: brisk extremity capillary refill; 2+ radial pulses of RUE and LUE. Lungs: unlabored respirations; CTAB Skin: warm and dry; no visible rashes Neurologic: normal gait; normal reflexes of RUE and LUE; normal sensation of RUE and LUE Psychological: alert and cooperative; normal mood and affect  Allergies  Allergen Reactions  . Cephalexin Itching and Rash  . Chloroquine Hives    Past Medical History:  Diagnosis Date  . Abnormal Pap smear    ascus with High Risk for HPV  . De Quervain's tenosynovitis, left   . Difficulty reading   . Gestational diabetes    GDM with last pregnancy (2008)  . History  of gestational diabetes   . History of hepatitis B 2007  . History of malaria 2003  . History of thrombocytopenia   . Obese    Social History   Socioeconomic History  . Marital status: Single    Spouse name: Not on file  . Number of children: Not on file  . Years of education: Not on file  . Highest education level: Not on file  Occupational History  . Not on file  Social Needs  .  Financial resource strain: Not on file  . Food insecurity:    Worry: Not on file    Inability: Not on file  . Transportation needs:    Medical: Not on file    Non-medical: Not on file  Tobacco Use  . Smoking status: Never Smoker  . Smokeless tobacco: Never Used  Substance and Sexual Activity  . Alcohol use: No  . Drug use: No  . Sexual activity: Not on file  Lifestyle  . Physical activity:    Days per week: Not on file    Minutes per session: Not on file  . Stress: Not on file  Relationships  . Social connections:    Talks on phone: Not on file    Gets together: Not on file    Attends religious service: Not on file    Active member of club or organization: Not on file    Attends meetings of clubs or organizations: Not on file    Relationship status: Not on file  Other Topics Concern  . Not on file  Social History Narrative   ** Merged History Encounter **       Family History  Problem Relation Age of Onset  . Hypertension Mother   . Stroke Mother    Past Surgical History:  Procedure Laterality Date  . CESAREAN SECTION  2008   last pregnacy  . CESAREAN SECTION  08/21/2011   Procedure: CESAREAN SECTION;  Surgeon: Donnamae Jude, MD;  Location: [facility];  Service: Gynecology;  Laterality: N/AVanessa Kick, MD 05/02/18 [phone]

## 2018-10-08 ENCOUNTER — Encounter (HOSPITAL_COMMUNITY): Payer: Self-pay | Admitting: Emergency Medicine

## 2018-10-08 ENCOUNTER — Other Ambulatory Visit: Payer: Self-pay

## 2018-10-08 ENCOUNTER — Ambulatory Visit (HOSPITAL_COMMUNITY)
Admission: EM | Admit: 2018-10-08 | Discharge: 2018-10-08 | Disposition: A | Discharge status: Home / Self Care | Payer: Medicaid Other | Attending: Family Medicine | Admitting: Family Medicine

## 2018-10-08 DIAGNOSIS — J302 Other seasonal allergic rhinitis: Secondary | ICD-10-CM

## 2018-10-08 MED ORDER — FLUTICASONE PROPIONATE 50 MCG/ACT NA SUSP: 2.0000 | Freq: Every day | NASAL | 0 refills | Status: DC

## 2018-10-08 MED ORDER — CETIRIZINE-PSEUDOEPHEDRINE ER 5-120 MG PO TB12: 1.0000 | ORAL_TABLET | Freq: Every day | ORAL | 0 refills | Status: DC

## 2018-10-08 NOTE — ED Triage Notes (Signed)
Pt c/o headache, ear pain, sore throat symptoms x1-2 months.

## 2018-10-08 NOTE — Discharge Instructions (Signed)
Symptoms most likely related to allergies Get plenty of rest and push fluids Zyrtec-D prescribed for nasal congestion, runny nose, and/or sore throat Flonase prescribed for nasal congestion and runny nose Use medications daily for symptom relief Use OTC medications like ibuprofen or tylenol as needed fever or pain Follow up with PCP or with [facility] if symptoms persist Return or go to ER if you have any new or worsening symptoms fever, chills, nausea, vomiting, chest pain, cough, shortness of breath, wheezing, abdominal pain, changes in bowel or bladder habits, etc..Marland Kitchen

## 2018-10-08 NOTE — ED Provider Notes (Signed)
[place]   [id number] 10/08/18 Arrival Time: 5176   CC: URI symptoms   SUBJECTIVE: History from: patient.  Brenda Cannon is a 38 y.o. female who presents with intermittent sinus pain/ pressure, ear pressure, and sore throat x 2-3 months.  Recent symptoms occurred 2-3 days ago. Denies sick exposure to COVID, flu or strep.  Denies recent travel. Has tried aleve with minimal relief.   Denies fever, chills, fatigue, rhinorrhea, cough,  SOB, wheezing, chest pain, chest pressure, nausea, changes in bowel or bladder habits.    ROS: As per HPI.  Past Medical History:  Diagnosis Date  . Abnormal Pap smear    ascus with High Risk for HPV  . De Quervain's tenosynovitis, left   . Difficulty reading   . Gestational diabetes    GDM with last pregnancy (2008)  . History of gestational diabetes   . History of hepatitis B 2007  . History of malaria 2003  . History of thrombocytopenia   . Obese    Past Surgical History:  Procedure Laterality Date  . CESAREAN SECTION  2008   last pregnacy  . CESAREAN SECTION  08/21/2011   Procedure: CESAREAN SECTION;  Surgeon: Donnamae Jude, MD;  Location: [facility];  Service: Gynecology;  Laterality: N/A;   Allergies  Allergen Reactions  . Cephalexin Itching and Rash  . Chloroquine Hives   No current facility-administered medications on file prior to encounter.    No current outpatient medications on file prior to encounter.   Social History   Socioeconomic History  . Marital status: Single    Spouse name: Not on file  . Number of children: Not on file  . Years of education: Not on file  . Highest education level: Not on file  Occupational History  . Not on file  Social Needs  . Financial resource strain: Not on file  . Food insecurity:    Worry: Not on file    Inability: Not on file  . Transportation needs:    Medical: Not on file    Non-medical: Not on file  Tobacco Use  . Smoking status: Never Smoker  . Smokeless  tobacco: Never Used  Substance and Sexual Activity  . Alcohol use: No  . Drug use: No  . Sexual activity: Not on file  Lifestyle  . Physical activity:    Days per week: Not on file    Minutes per session: Not on file  . Stress: Not on file  Relationships  . Social connections:    Talks on phone: Not on file    Gets together: Not on file    Attends religious service: Not on file    Active member of club or organization: Not on file    Attends meetings of clubs or organizations: Not on file    Relationship status: Not on file  . Intimate partner violence:    Fear of current or ex partner: Not on file    Emotionally abused: Not on file    Physically abused: Not on file    Forced sexual activity: Not on file  Other Topics Concern  . Not on file  Social History Narrative   ** Merged History Encounter **       Family History  Problem Relation Age of Onset  . Hypertension Mother   . Stroke Mother     OBJECTIVE:  Vitals:   10/08/18 1309 10/08/18 1311  BP:  98/84  Pulse: 64   Resp: 18  Temp: 98.2 F (36.8 C)   SpO2: 100%      General appearance: alert; well-appearing; speaking in full sentences and tolerating own secretions HEENT: NCAT; Ears: EACs clear, TMs pearly gray; Eyes: PERRL.  EOM grossly intact. Sinuses: nontender; Nose: nares patent without rhinorrhea, left boggy turbinate, Throat: oropharynx clear, tonsils non erythematous or enlarged, uvula midline  Neck: supple without LAD Lungs: unlabored respirations, symmetrical air entry; cough: absent; no respiratory distress; CTAB Heart: regular rate and rhythm.  Radial pulses 2+ symmetrical bilaterally Skin: warm and dry Psychological: alert and cooperative; normal mood and affect  ASSESSMENT & PLAN:  1. Seasonal allergies     Meds ordered this encounter  Medications  . cetirizine-pseudoephedrine (ZYRTEC-D) 5-120 MG tablet    Sig: Take 1 tablet by mouth daily.    Dispense:  30 tablet    Refill:  0    Order  Specific Question:   Supervising Provider    Answer:   Raylene Everts [[id number]]  . fluticasone (FLONASE) 50 MCG/ACT nasal spray    Sig: Place 2 sprays into both nostrils daily.    Dispense:  16 g    Refill:  0    Order Specific Question:   Supervising Provider    Answer:   Raylene Everts [[id number]]   Symptoms most likely related to allergies Get plenty of rest and push fluids Zyrtec-D prescribed for nasal congestion, runny nose, and/or sore throat Flonase prescribed for nasal congestion and runny nose Use medications daily for symptom relief Use OTC medications like ibuprofen or tylenol as needed fever or pain Follow up with PCP or with [facility] if symptoms persist Return or go to ER if you have any new or worsening symptoms fever, chills, nausea, vomiting, chest pain, cough, shortness of breath, wheezing, abdominal pain, changes in bowel or bladder habits, etc...  Reviewed expectations re: course of current medical issues. Questions answered. Outlined signs and symptoms indicating need for more acute intervention. Patient verbalized understanding. After Visit Summary given.         Lestine Box, PA-C 10/08/18 1331

## 2018-10-23 ENCOUNTER — Emergency Department (HOSPITAL_COMMUNITY): Payer: Self-pay

## 2018-10-23 ENCOUNTER — Other Ambulatory Visit: Payer: Self-pay

## 2018-10-23 ENCOUNTER — Emergency Department (HOSPITAL_COMMUNITY)
Admission: EM | Admit: 2018-10-23 | Discharge: 2018-10-23 | Disposition: A | Discharge status: Home / Self Care | Payer: Self-pay | Attending: Emergency Medicine | Admitting: Emergency Medicine

## 2018-10-23 ENCOUNTER — Encounter (HOSPITAL_COMMUNITY): Payer: Self-pay | Admitting: *Deleted

## 2018-10-23 DIAGNOSIS — Y939 Activity, unspecified: Secondary | ICD-10-CM | POA: Insufficient documentation

## 2018-10-23 DIAGNOSIS — Y999 Unspecified external cause status: Secondary | ICD-10-CM | POA: Insufficient documentation

## 2018-10-23 DIAGNOSIS — E119 Type 2 diabetes mellitus without complications: Secondary | ICD-10-CM | POA: Insufficient documentation

## 2018-10-23 DIAGNOSIS — X509XXA Other and unspecified overexertion or strenuous movements or postures, initial encounter: Secondary | ICD-10-CM | POA: Insufficient documentation

## 2018-10-23 DIAGNOSIS — M25512 Pain in left shoulder: Secondary | ICD-10-CM | POA: Insufficient documentation

## 2018-10-23 DIAGNOSIS — S161XXA Strain of muscle, fascia and tendon at neck level, initial encounter: Secondary | ICD-10-CM | POA: Insufficient documentation

## 2018-10-23 DIAGNOSIS — M545 Low back pain, unspecified: Secondary | ICD-10-CM

## 2018-10-23 DIAGNOSIS — Y929 Unspecified place or not applicable: Secondary | ICD-10-CM | POA: Insufficient documentation

## 2018-10-23 LAB — URINALYSIS, ROUTINE W REFLEX MICROSCOPIC
Bilirubin Urine: NEGATIVE
Glucose, UA: NEGATIVE mg/dL
Hgb urine dipstick: NEGATIVE
Ketones, ur: NEGATIVE mg/dL
Leukocytes,Ua: NEGATIVE
Nitrite: NEGATIVE
Protein, ur: NEGATIVE mg/dL
Specific Gravity, Urine: 1.014 (ref 1.005–1.030)
pH: 7 (ref 5.0–8.0)

## 2018-10-23 LAB — PREGNANCY, URINE: Preg Test, Ur: NEGATIVE

## 2018-10-23 NOTE — Discharge Instructions (Signed)
You can take Tylenol or Ibuprofen as directed for pain. You can alternate Tylenol and Ibuprofen every 4 hours. If you take Tylenol at 1pm, then you can take Ibuprofen at 5pm. Then you can take Tylenol again at 9pm.   Continue taking the muscle relaxer as prescribed by your primary care doctor.   Follow up with your primary care doctor.   You can apply heat to the affected areas to help with pain.   Return to the Emergency Department immediately for any worsening back pain, neck pain, difficulty walking, numbness/weaknss of your arms or legs, urinary or bowel accidents, fever or any other worsening or concerning symptoms.

## 2018-10-23 NOTE — ED Provider Notes (Signed)
[facility] DEPT Provider Note   CSN: [id number] Arrival date & time: 10/23/18  2017    History   Chief Complaint Chief Complaint  Patient presents with  . Torticollis  . Back Pain    HPI Brenda Cannon is a 38 y.o. female with PMH/o de Quervain's tenosynovitis, gestational diabetes, who presents for evaluation of left-sided neck and shoulder pain and lower back pain that is been ongoing for the last 3 months.  Patient states that she did not have any preceding trauma, injury, fall at onset of symptoms.  She has been seen multiple times for evaluation of the symptoms but states that nobody has found what is causing her to have pain.  She was last seen about 5 days ago where they gave her Zanaflex.  Additionally, they found evidence of UTI and she was started on antibiotic which she states she has been taking. She has not had any dysuria, hematuria but has had some pressure with urination.  Patient comes into the emergency department today because she continues to have pain.  She states the pain is in the left side of the neck that extends to the left shoulder.  She denies any chest pain.  States the pain is worse when she moves her arm reports limited range of motion of shoulder secondary to pain.  Patient also reports pain across the lower lumbar spine.  Patient has been able to ambulate without any difficulty.  She denies any numbness/weakness of arms or legs.  Patient does report she does not have standing for work and works at a [employer].  She does not think she does a lot of overhead lifting.  Any difficulty breathing, abdominal pain, dysuria, hematuria. Denies fevers, weight loss, numbness/weakness of upper and lower extremities, bowel/bladder incontinence, saddle anesthesia, history of back surgery, history of IVDA.      The history is provided by the patient.    Past Medical History:  Diagnosis Date  . Abnormal Pap smear    ascus with High  Risk for HPV  . De Quervain's tenosynovitis, left   . Difficulty reading   . Gestational diabetes    GDM with last pregnancy (2008)  . History of gestational diabetes   . History of hepatitis B 2007  . History of malaria 2003  . History of thrombocytopenia   . Obese     Patient Active Problem List   Diagnosis Date Noted  . Diabetes mellitus type 2 in obese ([place]) 03/24/2015  . Metatarsal stress fracture of left foot 11/10/2013  . Allergic rhinitis 05/26/2012  . 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 History of malaria 03/04/2011  . Hepatitis B infection 03/01/2011  . H/O: C-section 03/01/2011  . History of thrombocytopenia 03/01/2011    Past Surgical History:  Procedure Laterality Date  . CESAREAN SECTION  2008   last pregnacy  . CESAREAN SECTION  08/21/2011   Procedure: CESAREAN SECTION;  Surgeon: Donnamae Jude, MD;  Location: [facility];  Service: Gynecology;  Laterality: N/A;     OB History    Gravida  5   Para  3   Term  3   Preterm  0   AB  2   Living  3     SAB  1   TAB      Ectopic  0   Multiple  0   Live Births  3  Home Medications    Prior to Admission medications   Medication Sig Start Date End Date Taking? Authorizing Provider  cetirizine-pseudoephedrine (ZYRTEC-D) 5-120 MG tablet Take 1 tablet by mouth daily. 10/08/18   Wurst, [place], PA-C  fluticasone (FLONASE) 50 MCG/ACT nasal spray Place 2 sprays into both nostrils daily. 10/08/18   Lestine Box, PA-C    Family History Family History  Problem Relation Age of Onset  . Hypertension Mother   . Stroke Mother     Social History Social History   Tobacco Use  . Smoking status: Never Smoker  . Smokeless tobacco: Never Used  Substance Use Topics  . Alcohol use: No  . Drug use: No     Allergies   Cephalexin and Chloroquine   Review of Systems Review of Systems  Constitutional: Negative for fever.  Respiratory: Negative for shortness of breath.    Cardiovascular: Negative for chest pain.  Gastrointestinal: Negative for abdominal pain, nausea and vomiting.  Genitourinary: Negative for dysuria and hematuria.  Musculoskeletal: Positive for back pain and neck pain.       Left shoulder pain  Neurological: Negative for weakness, numbness and headaches.  All other systems reviewed and are negative.    Physical Exam Updated Vital Signs BP 119/63 (BP Location: Left Arm)   Pulse 71   Temp 98.6 F (37 C) (Oral)   Resp 16   LMP 10/08/2018   SpO2 100%   Physical Exam Vitals signs and nursing note reviewed.  Constitutional:      Appearance: She is well-developed.  HENT:     Head: Normocephalic and atraumatic.  Eyes:     General: No scleral icterus.       Right eye: No discharge.        Left eye: No discharge.     Conjunctiva/sclera: Conjunctivae normal.  Neck:     Musculoskeletal: Full passive range of motion without pain.      Comments: Full flexion/extension and lateral movement of neck fully intact. No bony midline tenderness. No deformities or crepitus. Diffuse muscular tenderness noted to the paraspinal muscles of the cervical region that extends into the left trapezius.  Cardiovascular:     Pulses:          Radial pulses are 2+ on the right side and 2+ on the left side.  Pulmonary:     Effort: Pulmonary effort is normal.  Abdominal:     Comments: Abdomen is soft, non-distended, non-tender. No rigidity, No guarding. No peritoneal signs.  Musculoskeletal:     Thoracic back: She exhibits no tenderness.       Back:     Comments: No midline T-spine tenderness.  No deformities or crepitus noted.  Diffuse muscular tenderness overlying the entire lumbar region that extends up to midline.  No deformities or crepitus noted.  Diffuse tenderness palpation noted to the anterior aspect of the left shoulder that extends posteriorly to the left trapezius.  Positive Neer's impingement, empty can test.  Negative Hawkins, liftoff test.   Patient can flex the shoulder at about 110 degrees before to start having pain.  Additionally, she gets to about 120 degrees of abduction of the left shoulder before she starts having pain.  No bony tenderness noted to left elbow, left wrist.  Flexion/tension of elbow and wrist intact without any difficulties.  No tenderness palpation noted to right upper extremity.  Negative Hawkins, impingement, liftoff, empty can.  Skin:    General: Skin is warm and dry.  Comments: Good distal cap refill. LUE is not dusky in appearance or cool to touch.  Neurological:     Mental Status: She is alert.     Comments: Follows commands, Moves all extremities  5/5 strength to BUE and BLE  Sensation intact throughout all major nerve distributions Normal gait  Psychiatric:        Speech: Speech normal.        Behavior: Behavior normal.      ED Treatments / Results  Labs (all labs ordered are listed, but only abnormal results are displayed) Labs Reviewed  URINALYSIS, ROUTINE W REFLEX MICROSCOPIC  PREGNANCY, URINE    EKG None  Radiology No results found.  Procedures Procedures (including critical care time)  Medications Ordered in ED Medications - No data to display   Initial Impression / Assessment and Plan / ED Course  I have reviewed the triage vital signs and the nursing notes.  Pertinent labs & imaging results that were available during my care of the patient were reviewed by me and considered in my medical decision making (see chart for details).        38 y.o. F presents for evaluation of left-sided neck, shoulder as well as lower back pain that has been ongoing for last 2 to 3 months.  He has been seen by urgent care and PCP on multiple occasions for evaluation of same symptoms but no conclusive findings.  Patient came in today because she states she continues have pain.  She is taking Zanaflex.  No trauma, injury.  No numbness/weakness, fevers.  Patient also reports she was  recently treated for UTI.  No dysuria, hematuria. Patient is afebrile, non-toxic appearing, sitting comfortably on examination table. Vital signs reviewed and stable.  No neuro deficits noted on exam.  Red flag history.  Consider musculoskeletal pain versus rotator cuff pathology versus tendinitis.  History/physical exam concerning for cauda equina, spinal abscess.  No dictation for acute MRI imaging at this time. No meningismus. No rash concerning for shingles. Patient states that she is not had any x-ray imaging of the shoulder.  Will plan for x-rays, check UA.   UA negative for any infectious etiology. Urine pregnancy negative.   Patient signed out to Lorre Munroe, PA-C with XRs pending.   Portions of this note were generated with [employer]. Dictation errors may occur despite best attempts at proofreading.   Final Clinical Impressions(s) / ED Diagnoses   Final diagnoses:  Acute strain of neck muscle, initial encounter  Acute pain of left shoulder  Acute bilateral low back pain without sciatica    ED Discharge Orders    None       Desma Mcgregor 10/23/18 2204    Pattricia Boss, MD 10/29/18 1113

## 2018-10-23 NOTE — ED Notes (Signed)
Urine culture sent to the lab. 

## 2018-10-23 NOTE — ED Notes (Signed)
Pt reports L neck pain and lower back pain x 2-3 months.  She has been seen at her pcp and UC for same.  She is currently being treated for UTI with abx without relief.  Pain in her L neck is aggravated by moving her L arm up laterally.  She started to have in her side bilaterally when urinating today.  She is not tender in her flank area.  No hematuria.  Denies fever, cough, sob, cp, or dizziness.

## 2018-11-12 ENCOUNTER — Emergency Department (HOSPITAL_COMMUNITY)
Admission: EM | Admit: 2018-11-12 | Discharge: 2018-11-12 | Disposition: A | Discharge status: Home / Self Care | Payer: HRSA Program | Attending: Emergency Medicine | Admitting: Emergency Medicine

## 2018-11-12 ENCOUNTER — Encounter (HOSPITAL_COMMUNITY): Payer: Self-pay | Admitting: Emergency Medicine

## 2018-11-12 ENCOUNTER — Other Ambulatory Visit: Payer: Self-pay

## 2018-11-12 DIAGNOSIS — R0789 Other chest pain: Secondary | ICD-10-CM | POA: Diagnosis not present

## 2018-11-12 DIAGNOSIS — R519 Headache, unspecified: Secondary | ICD-10-CM

## 2018-11-12 DIAGNOSIS — M542 Cervicalgia: Secondary | ICD-10-CM | POA: Insufficient documentation

## 2018-11-12 DIAGNOSIS — Z1159 Encounter for screening for other viral diseases: Secondary | ICD-10-CM | POA: Diagnosis not present

## 2018-11-12 DIAGNOSIS — R51 Headache: Secondary | ICD-10-CM | POA: Insufficient documentation

## 2018-11-12 MED ORDER — METOCLOPRAMIDE HCL 5 MG/ML IJ SOLN
10.0000 mg | Freq: Once | INTRAMUSCULAR | Status: AC
  Administered 2018-11-12: 10 mg via INTRAVENOUS
  Filled 2018-11-12: qty 2

## 2018-11-12 MED ORDER — KETOROLAC TROMETHAMINE 15 MG/ML IJ SOLN
15.0000 mg | Freq: Once | INTRAMUSCULAR | Status: AC
  Administered 2018-11-12: 18:00:00 15 mg via INTRAVENOUS
  Filled 2018-11-12: qty 1

## 2018-11-12 MED ORDER — DIPHENHYDRAMINE HCL 25 MG PO CAPS
25.0000 mg | ORAL_CAPSULE | Freq: Once | ORAL | Status: AC
  Administered 2018-11-12: 25 mg via ORAL
  Filled 2018-11-12: qty 1

## 2018-11-12 NOTE — ED Notes (Signed)
ED Provider at bedside. 

## 2018-11-12 NOTE — ED Provider Notes (Signed)
[facility] EMERGENCY DEPARTMENT Provider Note   CSN: [id number] Arrival date & time: 11/12/18  1424    History   Chief Complaint Chief Complaint  Patient presents with  . Headache  . Neck Pain  . Nasal Congestion    HPI Brenda Cannon is a 38 y.o. female. With  de Quervain's tenosynovitis, gestational diabetes who presents with left sided neck pain and headache.  Patient states symptoms have been going on intermittently for the past 5 months.  States over the past 2 days she is unable to get relief with ibuprofen.  Also endorsing some left-sided neck pain along her SCM and trapezius.  No fevers, midline tenderness.  Headache is not described as sudden onset.  No history of IV drug abuse.     HPI  Past Medical History:  Diagnosis Date  . Abnormal Pap smear    ascus with High Risk for HPV  . De Quervain's tenosynovitis, left   . Difficulty reading   . Gestational diabetes    GDM with last pregnancy (2008)  . History of gestational diabetes   . History of hepatitis B 2007  . History of malaria 2003  . History of thrombocytopenia   . Obese     Patient Active Problem List   Diagnosis Date Noted  . Diabetes mellitus type 2 in obese ([place]) 03/24/2015  . Metatarsal stress fracture of left foot 11/10/2013  . Allergic rhinitis 05/26/2012  . 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 History of malaria 03/04/2011  . Hepatitis B infection 03/01/2011  . H/O: C-section 03/01/2011  . History of thrombocytopenia 03/01/2011    Past Surgical History:  Procedure Laterality Date  . CESAREAN SECTION  2008   last pregnacy  . CESAREAN SECTION  08/21/2011   Procedure: CESAREAN SECTION;  Surgeon: Donnamae Jude, MD;  Location: [facility];  Service: Gynecology;  Laterality: N/A;     OB History    Gravida  5   Para  3   Term  3   Preterm  0   AB  2   Living  3     SAB  1   TAB      Ectopic  0   Multiple  0   Live Births  3             Home Medications    Prior to Admission medications   Medication Sig Start Date End Date Taking? Authorizing Provider  cetirizine-pseudoephedrine (ZYRTEC-D) 5-120 MG tablet Take 1 tablet by mouth daily. 10/08/18   Wurst, [place], PA-C  fluticasone (FLONASE) 50 MCG/ACT nasal spray Place 2 sprays into both nostrils daily. 10/08/18   Lestine Box, PA-C    Family History Family History  Problem Relation Age of Onset  . Hypertension Mother   . Stroke Mother     Social History Social History   Tobacco Use  . Smoking status: Never Smoker  . Smokeless tobacco: Never Used  Substance Use Topics  . Alcohol use: No  . Drug use: No     Allergies   Cephalexin and Chloroquine   Review of Systems Review of Systems  Constitutional: Negative for activity change, appetite change, fatigue and fever.  Respiratory: Negative for chest tightness and shortness of breath.   Cardiovascular: Negative for chest pain.  Gastrointestinal: Negative for abdominal pain, nausea and vomiting.  Genitourinary: Negative for difficulty urinating and dysuria.  Neurological: Positive for headaches.  All other systems reviewed and are negative.  Physical Exam Updated Vital Signs BP 115/77   Pulse (!) 51   Temp 98.5 F (36.9 C) (Oral)   Resp 17   Ht 5\' 1"  (1.549 m)   Wt 63.5 kg   SpO2 97%   BMI 26.45 kg/m   Physical Exam Vitals signs and nursing note reviewed.  Constitutional:      General: She is not in acute distress.    Appearance: She is well-developed. She is not ill-appearing or toxic-appearing.  HENT:     Head: Normocephalic and atraumatic.  Eyes:     Extraocular Movements: Extraocular movements intact.     Conjunctiva/sclera: Conjunctivae normal.  Neck:     Musculoskeletal: Neck supple.  Cardiovascular:     Rate and Rhythm: Normal rate and regular rhythm.     Heart sounds: No murmur.  Pulmonary:     Effort: Pulmonary effort is normal. No respiratory distress.     Breath sounds:  Normal breath sounds. No wheezing or rhonchi.  Abdominal:     Palpations: Abdomen is soft.     Tenderness: There is no abdominal tenderness.  Skin:    General: Skin is warm and dry.  Neurological:     Mental Status: She is alert and oriented to person, place, and time.     GCS: GCS eye subscore is 4. GCS verbal subscore is 5. GCS motor subscore is 6.     Cranial Nerves: No cranial nerve deficit, dysarthria or facial asymmetry.     Sensory: No sensory deficit.     Motor: No weakness.     Coordination: Coordination normal.     Gait: Gait normal.  Psychiatric:        Mood and Affect: Mood normal.        Behavior: Behavior normal.      ED Treatments / Results  Labs (all labs ordered are listed, but only abnormal results are displayed) Labs Reviewed  NOVEL CORONAVIRUS, NAA (HOSPITAL ORDER, SEND-OUT TO REF LAB)    EKG None  Radiology No results found.  Procedures Procedures (including critical care time)  Medications Ordered in ED Medications  ketorolac (TORADOL) 15 MG/ML injection 15 mg (15 mg Intravenous Given 11/12/18 1749)  metoCLOPramide (REGLAN) injection 10 mg (10 mg Intravenous Given 11/12/18 1749)  diphenhydrAMINE (BENADRYL) capsule 25 mg (25 mg Oral Given 11/12/18 1748)     Initial Impression / Assessment and Plan / ED Course  I have reviewed the triage vital signs and the nursing notes.  Pertinent labs & imaging results that were available during my care of the patient were reviewed by me and considered in my medical decision making (see chart for details).        Brenda Cannon is a 38 y.o. female. With  de Quervain's tenosynovitis, gestational diabetes who presents with left sided neck pain and headache.   Meningitis is unlikely due to no fever, neck stiffness or meningeal signs Encephalitis is unlikely due to no fever or AMS SAH is unlikely due to this being similar to previous HA and no sudden onset SDH is unlikely because there was no trauma, the  patient is not elderly or an alcoholic Epidural hematoma is unlikely because there was no trauma Carotid artery dissection is unlikely because there is no unilateral neck pain or associated trauma Hypertensive encephalitis is unlikely because there are no neuro symptoms or HTN Temporal arteritis/GCA is unlikely because the temporal area is not tender to palpation, and the patient is <55y/o Acute angle glaucoma is unlikely  because there is no unilateral blurry vision or a fixed pupil    Will treat with headache cocktail and IV toradol. Patient transferred care to Domenic Moras PA. Please see his note for further management.  Final Clinical Impressions(s) / ED Diagnoses   Final diagnoses:  Bad headache    ED Discharge Orders    None       Doneta Public, MD 11/12/18 0211    Veryl Speak, MD 11/13/18 [phone]

## 2018-11-12 NOTE — ED Triage Notes (Signed)
Onset 4 months ago developed nasal congestion, neck pain, bilateral arm pain. Last night developed headache pain currently 9/10. Alert answering and following commands appropriate. States she would like to get tested for COVID19 because where she works a lot of people tested positive and she wants to make sure she doesn't have it. Does not have a cough or shortness of breath.

## 2018-11-12 NOTE — ED Provider Notes (Signed)
Pt here with headache for the past few weeks.  No red flags.  Initially evaluated by resident Doneta Public.  Plan to treat headache with migraine cocktail.  Pt also request to be tested for covid-19.  Pt works at a [employer] in which many of her co workers have been sick with covid-19. She does not have any covid-19 symptoms.  8:17 PM Will perform covid-19 testing with lab sent out.  Pt will be contacted if positive.  Pt received migraine cocktail and felt much better, stable for discharge.   Brenda Cannon was evaluated in Emergency Department on 11/12/2018 for the symptoms described in the history of present illness. She was evaluated in the context of the global COVID-19 pandemic, which necessitated consideration that the patient might be at risk for infection with the SARS-CoV-2 virus that causes COVID-19. Institutional protocols and algorithms that pertain to the evaluation of patients at risk for COVID-19 are in a state of rapid change based on information released by regulatory bodies including the CDC and federal and state organizations. These policies and algorithms were followed during the patient's care in the ED.  BP 102/70   Pulse (!) 51   Temp 98.5 F (36.9 C) (Oral)   Resp 17   Ht 5\' 1"  (1.549 m)   Wt 63.5 kg   SpO2 97%   BMI 26.45 kg/m      Domenic Moras, PA-C 11/12/18 2018    Veryl Speak, MD 11/13/18 [phone]

## 2018-11-14 LAB — NOVEL CORONAVIRUS, NAA (HOSP ORDER, SEND-OUT TO REF LAB; TAT 18-24 HRS): SARS-CoV-2, NAA: NOT DETECTED

## 2018-12-22 ENCOUNTER — Emergency Department (HOSPITAL_COMMUNITY)
Admission: EM | Admit: 2018-12-22 | Discharge: 2018-12-22 | Disposition: A | Discharge status: Home / Self Care | Payer: 59 | Attending: Emergency Medicine | Admitting: Emergency Medicine

## 2018-12-22 ENCOUNTER — Encounter (HOSPITAL_COMMUNITY): Payer: Self-pay

## 2018-12-22 ENCOUNTER — Emergency Department (HOSPITAL_COMMUNITY): Payer: 59

## 2018-12-22 ENCOUNTER — Other Ambulatory Visit: Payer: Self-pay

## 2018-12-22 DIAGNOSIS — Y9389 Activity, other specified: Secondary | ICD-10-CM | POA: Diagnosis not present

## 2018-12-22 DIAGNOSIS — M79672 Pain in left foot: Secondary | ICD-10-CM | POA: Insufficient documentation

## 2018-12-22 DIAGNOSIS — E119 Type 2 diabetes mellitus without complications: Secondary | ICD-10-CM | POA: Insufficient documentation

## 2018-12-22 DIAGNOSIS — Z79899 Other long term (current) drug therapy: Secondary | ICD-10-CM | POA: Insufficient documentation

## 2018-12-22 DIAGNOSIS — Y9289 Other specified places as the place of occurrence of the external cause: Secondary | ICD-10-CM | POA: Insufficient documentation

## 2018-12-22 DIAGNOSIS — M542 Cervicalgia: Secondary | ICD-10-CM | POA: Diagnosis not present

## 2018-12-22 DIAGNOSIS — M25512 Pain in left shoulder: Secondary | ICD-10-CM | POA: Insufficient documentation

## 2018-12-22 DIAGNOSIS — X503XXA Overexertion from repetitive movements, initial encounter: Secondary | ICD-10-CM | POA: Diagnosis not present

## 2018-12-22 MED ORDER — METHOCARBAMOL 500 MG PO TABS: 500.0000 mg | ORAL_TABLET | Freq: Three times a day (TID) | ORAL | 0 refills | Status: DC | PRN

## 2018-12-22 MED ORDER — NAPROXEN 250 MG PO TABS
500.0000 mg | ORAL_TABLET | Freq: Once | ORAL | Status: AC
  Administered 2018-12-22: 500 mg via ORAL
  Filled 2018-12-22: qty 2

## 2018-12-22 MED ORDER — NAPROXEN 500 MG PO TABS: 500.0000 mg | ORAL_TABLET | Freq: Two times a day (BID) | ORAL | 0 refills | Status: DC

## 2018-12-22 NOTE — ED Notes (Signed)
Patient verbalizes understanding of discharge instructions. Opportunity for questioning and answers were provided. Pt discharged from ED. 

## 2018-12-22 NOTE — ED Triage Notes (Signed)
Pt c/o intermittent L sided pain to neck and shoulder x 2 months; this morning pt says the pain is worse,  unable to turn neck; also c/o L heel pain; denies injury; denies fevers or cough

## 2018-12-22 NOTE — Discharge Instructions (Signed)
You were seen in the ER today for left neck/shoulder pain and left heel pain.  We suspect your left next shoulder pain is related to muscle pain, this may also be a rotator cuff.  We suspect your left heel pain is related to something called plantar fasciitis, please see the attached handout.  We are sending you with the following medicines: - Naproxen is a nonsteroidal anti-inflammatory medication that will help with pain and swelling. Be sure to take this medication as prescribed with food, 1 pill every 12 hours,  It should be taken with food, as it can cause stomach upset, and more seriously, stomach bleeding. Do not take other nonsteroidal anti-inflammatory medications with this such as Advil, Motrin, Aleve, Mobic, Goodie Powder, or Motrin.    - Robaxin is the muscle relaxer I have prescribed, this is meant to help with muscle tightness. Be aware that this medication may make you drowsy therefore the first time you take this it should be at a time you are in an environment where you can rest. Do not drive or operate heavy machinery when taking this medication. Do not drink alcohol or take other sedating medications with this medicine such as narcotics or benzodiazepines.    We have prescribed you new medication(s) today. Discuss the medications prescribed today with your pharmacist as they can have adverse effects and interactions with your other medicines including over the counter and prescribed medications. Seek medical evaluation if you start to experience new or abnormal symptoms after taking one of these medicines, seek care immediately if you start to experience difficulty breathing, feeling of your throat closing, facial swelling, or rash as these could be indications of a more serious allergic reaction  Please apply a heating pad to the left neck/shoulder area.  Please purchase heel cups from the pharmacy, asked the pharmacist where these are located, to help with possible plantar  fasciitis.  Please follow-up with your primary care provider or with orthopedics within 1 week.  Return to the ER for new or worsening symptoms or any other concerns.

## 2018-12-22 NOTE — ED Provider Notes (Signed)
[place] EMERGENCY DEPARTMENT Provider Note   CSN: [id number] Arrival date & time: 12/22/18  1253     History   Chief Complaint Chief Complaint  Patient presents with  . Neck Pain  . Foot Pain    HPI Brenda Cannon is a 38 y.o. female with a history of hepatitis B, thrombocytopenia, obesity, and T2DM who presents to the ED with complaints of L sided shoulder/neck pain x 2-3 months worse this AM & L heel pain for the past 2 weeks. Patient notes pain to L neck/shoulder is constant, worse w/ movement of LUE, no alleviating factors. No trauma to the area. Does a lot of heavy lifting/moving at work. This AM woke up thinking she might have slept wrong w/ increased pain to this area. She also notes L heel pain for the past 2 weeks that is more notable in the AM when she takes her first few steps but does persist throughout the day. No alleviating/aggravating factors. No trauma to the area. She does stand all day at her job.  Denies numbness, tingling, weakness, fever, chills, color change, or swelling.  Denies chest pain or dyspnea.     HPI  Past Medical History:  Diagnosis Date  . Abnormal Pap smear    ascus with High Risk for HPV  . De Quervain's tenosynovitis, left   . Difficulty reading   . Gestational diabetes    GDM with last pregnancy (2008)  . History of gestational diabetes   . History of hepatitis B 2007  . History of malaria 2003  . History of thrombocytopenia   . Obese     Patient Active Problem List   Diagnosis Date Noted  . Diabetes mellitus type 2 in obese ([place]) 03/24/2015  . Metatarsal stress fracture of left foot 11/10/2013  . Allergic rhinitis 05/26/2012  . 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 History of malaria 03/04/2011  . Hepatitis B infection 03/01/2011  . H/O: C-section 03/01/2011  . History of thrombocytopenia 03/01/2011    Past Surgical History:  Procedure Laterality Date  . CESAREAN SECTION  2008   last  pregnacy  . CESAREAN SECTION  08/21/2011   Procedure: CESAREAN SECTION;  Surgeon: Donnamae Jude, MD;  Location: [facility];  Service: Gynecology;  Laterality: N/A;     OB History    Gravida  5   Para  3   Term  3   Preterm  0   AB  2   Living  3     SAB  1   TAB      Ectopic  0   Multiple  0   Live Births  3            Home Medications    Prior to Admission medications   Medication Sig Start Date End Date Taking? Authorizing Provider  cetirizine-pseudoephedrine (ZYRTEC-D) 5-120 MG tablet Take 1 tablet by mouth daily. 10/08/18   Wurst, [place], PA-C  fluticasone (FLONASE) 50 MCG/ACT nasal spray Place 2 sprays into both nostrils daily. 10/08/18   Lestine Box, PA-C    Family History Family History  Problem Relation Age of Onset  . Hypertension Mother   . Stroke Mother     Social History Social History   Tobacco Use  . Smoking status: Never Smoker  . Smokeless tobacco: Never Used  Substance Use Topics  . Alcohol use: No  . Drug use: No     Allergies   Cephalexin and  Chloroquine   Review of Systems Review of Systems  Constitutional: Negative for chills and fever.  Respiratory: Negative for shortness of breath.   Cardiovascular: Negative for chest pain.  Gastrointestinal: Negative for abdominal pain and vomiting.  Musculoskeletal: Positive for arthralgias and neck pain.  Skin: Negative for color change, rash and wound.  Neurological: Negative for weakness and numbness.       Negative for incontinence     Physical Exam Updated Vital Signs BP (!) 127/91 (BP Location: Right Arm)   Pulse 88   Temp 98.8 F (37.1 C) (Oral)   Resp 16   Ht 5\' 1"  (1.549 m)   Wt 68 kg   SpO2 99%   BMI 28.34 kg/m   Physical Exam Vitals signs and nursing note reviewed.  Constitutional:      General: She is not in acute distress.    Appearance: Normal appearance. She is not ill-appearing or toxic-appearing.  HENT:     Head: Normocephalic and atraumatic.   Neck:     Musculoskeletal: Normal range of motion and neck supple. Muscular tenderness (L sided paraspinal muscles) present. No spinous process tenderness.  Cardiovascular:     Rate and Rhythm: Normal rate.     Pulses:          Radial pulses are 2+ on the right side and 2+ on the left side.       Dorsalis pedis pulses are 2+ on the right side and 2+ on the left side.       Posterior tibial pulses are 2+ on the right side and 2+ on the left side.  Pulmonary:     Effort: Pulmonary effort is normal. No respiratory distress.     Breath sounds: Normal breath sounds.  Musculoskeletal:     Comments: No obvious deformity, appreciable swelling, edema, erythema, ecchymosis, warmth, or open wounds. Upper extremities: Patient has intact AROM throughout, pain w/ L shoulder flexion/abduction. Tender to palpation to L trapezius, supraspinatus, & diffuse GH joint. Otherwise nontender. NVI distally. Some discomfort w/ LUE empty can test, but good strength with this.  Lower extremities: Patient has intact AROM throughout.  Tenderness palpation to the plantar aspect of the left calcaneus extending to the plantar aspect of the midfoot.  Otherwise nontender.  NVI distally.  Skin:    General: Skin is warm and dry.     Capillary Refill: Capillary refill takes less than 2 seconds.  Neurological:     Mental Status: She is alert.     Comments: Alert. Clear speech. Sensation grossly intact to bilateral upper/lower extremities. 5/5 symmetric grip strength. 5/5 strength w/ plantar/dorsiflexion bilaterally. Able to perform OK sign, thumbs up, and cross 2nd/3rd digits. Ambulatory.   Psychiatric:        Mood and Affect: Mood normal.        Behavior: Behavior normal.    ED Treatments / Results  Labs (all labs ordered are listed, but only abnormal results are displayed) Labs Reviewed - No data to display  EKG    Radiology Dg Shoulder Left  Result Date: 12/22/2018 CLINICAL DATA:  Pain EXAM: LEFT SHOULDER - 2+  VIEW COMPARISON:  10/23/2018 FINDINGS: There is no evidence of fracture or dislocation. There is no evidence of arthropathy or other focal bone abnormality. Soft tissues are unremarkable. IMPRESSION: Negative. Electronically Signed   By: Constance Holster M.D.   On: 12/22/2018 14:57   Dg Foot Complete Left  Result Date: 12/22/2018 CLINICAL DATA:  Pain EXAM: LEFT FOOT - COMPLETE  3+ VIEW COMPARISON:  10/25/2013 FINDINGS: There is no evidence of fracture or dislocation. There is no evidence of arthropathy or other focal bone abnormality. Soft tissues are unremarkable. There is a small plantar calcaneal spur. There is an os trigonum. IMPRESSION: Negative. Electronically Signed   By: Constance Holster M.D.   On: 12/22/2018 15:01    Procedures Procedures (including critical care time)  Medications Ordered in ED Medications - No data to display   Initial Impression / Assessment and Plan / ED Course  I have reviewed the triage vital signs and the nursing notes.  Pertinent labs & imaging results that were available during my care of the patient were reviewed by me and considered in my medical decision making (see chart for details).   Patient presents to the emergency department with complaints of left neck/shoulder pain for the past few months worse this morning as well as left heel pain for the past 2 weeks.  Patient is nontoxic-appearing, no apparent distress, vitals without significant abnormality.  No fever, erythema, or warmth to either location of discomfort, does not seem consistent with infectious processes such as osteomyelitis, septic joint, or cellulitis.  Left neck/shoulder: Intact active range of motion, discomfort with left shoulder flexion/abduction noted.  Reproducible with muscular palpation as well as diffuse glenohumeral joint palpation.  She has some discomfort w/ empty can test. Suspect muscular strain/spasm, also considering rotator cuff etiology. No trauma, no midline spinal  tenderness or neuro deficits to suggest cord compression or fx/dislocation. Given diffuse GH tenderness shoulder xray obtained & negative. No nuchal rigidity of rever to suggest meningitis.   L heel: Plantar portion of calcaneous & midfoot tender to palpation, NVI distally. Xray negative for fx/dislocation. Based on location & worse in the AM suspect plantar fasciitis, but unclear definitive etiology. Trial of heel cups.   Will prescribe naproxen (last creatinine WNL on chart review) and Robaxin to help with patient's symptoms, instructed she is not to drive or operate heavy machinery when taking Robaxin. Follow-up with PCP versus orthopedics. I discussed results, treatment plan, need for follow-up, and return precautions with the patient. Provided opportunity for questions, patient confirmed understanding and is in agreement with plan.   Final Clinical Impressions(s) / ED Diagnoses   Final diagnoses:  Left shoulder pain, unspecified chronicity  Pain of left heel    ED Discharge Orders         Ordered    naproxen (NAPROSYN) 500 MG tablet  2 times daily     12/22/18 1535    methocarbamol (ROBAXIN) 500 MG tablet  Every 8 hours PRN     12/22/18 [address] [place], PA-C 12/22/18 1544    Carmin Muskrat, MD 12/22/18 1556

## 2019-01-01 ENCOUNTER — Emergency Department (HOSPITAL_COMMUNITY): Payer: 59

## 2019-01-01 ENCOUNTER — Emergency Department (HOSPITAL_COMMUNITY)
Admission: EM | Admit: 2019-01-01 | Discharge: 2019-01-01 | Disposition: A | Discharge status: Home / Self Care | Payer: 59 | Attending: Emergency Medicine | Admitting: Emergency Medicine

## 2019-01-01 DIAGNOSIS — R059 Cough, unspecified: Secondary | ICD-10-CM

## 2019-01-01 DIAGNOSIS — R509 Fever, unspecified: Secondary | ICD-10-CM

## 2019-01-01 DIAGNOSIS — E119 Type 2 diabetes mellitus without complications: Secondary | ICD-10-CM | POA: Insufficient documentation

## 2019-01-01 DIAGNOSIS — U071 COVID-19: Secondary | ICD-10-CM | POA: Insufficient documentation

## 2019-01-01 DIAGNOSIS — R05 Cough: Secondary | ICD-10-CM

## 2019-01-01 MED ORDER — ACETAMINOPHEN 500 MG PO TABS
1000.0000 mg | ORAL_TABLET | Freq: Once | ORAL | Status: AC
  Administered 2019-01-01: 1000 mg via ORAL
  Filled 2019-01-01: qty 2

## 2019-01-01 NOTE — ED Provider Notes (Signed)
[place] [facility] EMERGENCY DEPARTMENT Provider Note   CSN: [id number] Arrival date & time: 01/01/19  1406     History   Chief Complaint Chief Complaint  Patient presents with  . Cough  . Fever    HPI Brenda Cannon is a 38 y.o. female who presents with fever and body aches.  Past medical history significant for gestational diabetes, chronic neck pain, history of hep B, history of malaria.  Patient states that yesterday she started to develop body aches.  She states that her whole body hurts and she reports associated fever and chills.  She has had a dry cough and some congestion.  She works in a [employer] and states that several people have tested positive for coronavirus there but she was tested for coronavirus about a month ago and was negative.  She has been taking ibuprofen for fevers.  She denies headache, shortness of breath, abdominal pain, nausea, vomiting, diarrhea, urinary symptoms.  She reports chest pain and throat pain when she coughs.  No constant or exertional chest pain.  She has not traveled recently.  She also states that the left side of her neck hurts more.  She was seen for this problem several weeks ago and prescribed muscle relaxers.  She states that this is a chronic issue that is been coming and going.  She has had an x-ray of her neck which was negative but has not followed up with her doctor about this.     HPI  Past Medical History:  Diagnosis Date  . Abnormal Pap smear    ascus with High Risk for HPV  . De Quervain's tenosynovitis, left   . Difficulty reading   . Gestational diabetes    GDM with last pregnancy (2008)  . History of gestational diabetes   . History of hepatitis B 2007  . History of malaria 2003  . History of thrombocytopenia   . Obese     Patient Active Problem List   Diagnosis Date Noted  . Diabetes mellitus type 2 in obese ([place]) 03/24/2015  . Metatarsal stress fracture of left foot 11/10/2013  . Allergic  rhinitis 05/26/2012  . 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 History of malaria 03/04/2011  . Hepatitis B infection 03/01/2011  . H/O: C-section 03/01/2011  . History of thrombocytopenia 03/01/2011    Past Surgical History:  Procedure Laterality Date  . CESAREAN SECTION  2008   last pregnacy  . CESAREAN SECTION  08/21/2011   Procedure: CESAREAN SECTION;  Surgeon: Donnamae Jude, MD;  Location: [facility];  Service: Gynecology;  Laterality: N/A;     OB History    Gravida  5   Para  3   Term  3   Preterm  0   AB  2   Living  3     SAB  1   TAB      Ectopic  0   Multiple  0   Live Births  3            Home Medications    Prior to Admission medications   Medication Sig Start Date End Date Taking? Authorizing Provider  methocarbamol (ROBAXIN) 500 MG tablet Take 1 tablet (500 mg total) by mouth every 8 (eight) hours as needed for muscle spasms. 12/22/18   Petrucelli, Samantha R, PA-C  naproxen (NAPROSYN) 500 MG tablet Take 1 tablet (500 mg total) by mouth 2 (two) times daily. 12/22/18   Petrucelli, Aldona Bar  R, PA-C  fluticasone (FLONASE) 50 MCG/ACT nasal spray Place 2 sprays into both nostrils daily. Patient not taking: Reported on 12/22/2018 10/08/18 12/22/18  Lestine Box, PA-C    Family History Family History  Problem Relation Age of Onset  . Hypertension Mother   . Stroke Mother     Social History Social History   Tobacco Use  . Smoking status: Never Smoker  . Smokeless tobacco: Never Used  Substance Use Topics  . Alcohol use: No  . Drug use: No     Allergies   Cephalexin and Chloroquine   Review of Systems Review of Systems  Constitutional: Positive for chills and fever.  HENT: Positive for sore throat.   Respiratory: Positive for cough. Negative for shortness of breath.   Cardiovascular: Positive for chest pain.  Gastrointestinal: Negative for abdominal pain, diarrhea, nausea and vomiting.  Genitourinary: Negative for dysuria  and flank pain.  Musculoskeletal: Positive for myalgias.     Physical Exam Updated Vital Signs BP 125/80   Pulse 77   Temp (!) 101 F (38.3 C) (Oral)   Resp 18   LMP 12/15/2018   SpO2 97%   Physical Exam Vitals signs and nursing note reviewed.  Constitutional:      General: She is not in acute distress.    Appearance: Normal appearance. She is well-developed. She is not ill-appearing.     Comments: Calm, cooperative.  Fatigued appearing  HENT:     Head: Normocephalic and atraumatic.     Right Ear: Tympanic membrane normal.     Left Ear: Tympanic membrane normal.     Nose: Nose normal.     Mouth/Throat:     Lips: Pink.     Mouth: Mucous membranes are moist.     Pharynx: Oropharynx is clear.  Eyes:     General: No scleral icterus.       Right eye: No discharge.        Left eye: No discharge.     Conjunctiva/sclera: Conjunctivae normal.     Pupils: Pupils are equal, round, and reactive to light.  Neck:     Musculoskeletal: Normal range of motion.  Cardiovascular:     Rate and Rhythm: Normal rate and regular rhythm.  Pulmonary:     Effort: Pulmonary effort is normal. No respiratory distress.     Breath sounds: Normal breath sounds.  Abdominal:     General: There is no distension.     Palpations: Abdomen is soft.     Tenderness: There is no abdominal tenderness.  Skin:    General: Skin is warm and dry.  Neurological:     Mental Status: She is alert and oriented to person, place, and time.  Psychiatric:        Behavior: Behavior normal.      ED Treatments / Results  Labs (all labs ordered are listed, but only abnormal results are displayed) Labs Reviewed  NOVEL CORONAVIRUS, NAA (HOSPITAL ORDER, SEND-OUT TO REF LAB)    EKG    Radiology Dg Chest Port 1 View  Result Date: 01/01/2019 CLINICAL DATA:  Fever, cough EXAM: PORTABLE CHEST 1 VIEW COMPARISON:  Portable exam 1610 hours compared to 07/22/2016 FINDINGS: Minimal enlargement of cardiac silhouette.  Mediastinal contours and pulmonary vascularity normal. Lungs clear. No pulmonary infiltrate, pleural effusion or pneumothorax. Bones unremarkable. IMPRESSION: No acute abnormalities. Electronically Signed   By: Lavonia Dana M.D.   On: 01/01/2019 16:30    Procedures Procedures (including critical care time)  Medications Ordered  in ED Medications  acetaminophen (TYLENOL) tablet 1,000 mg (1,000 mg Oral Given 01/01/19 1658)     Initial Impression / Assessment and Plan / ED Course  I have reviewed the triage vital signs and the nursing notes.  Pertinent labs & imaging results that were available during my care of the patient were reviewed by me and considered in my medical decision making (see chart for details).  38 year old female presents with fever and a cough.  She has a temp of 101 here.  Otherwise vital signs are normal.  Exam is consistent with respiratory illness.  Chest x-ray was obtained and is negative.  There is suspicion for COVID in this patient and she has had several people at work who have had tested positive.  She was actually tested in May but was negative at that time.  We will perform send out test and she was advised to self isolate.  Return precautions were discussed  Final Clinical Impressions(s) / ED Diagnoses   Final diagnoses:  Febrile illness  Cough    ED Discharge Orders    None       Recardo Evangelist, PA-C 01/01/19 1719    Sherwood Gambler, MD 01/05/19 [phone]

## 2019-01-01 NOTE — ED Triage Notes (Signed)
Pt c/o cough and fever x2 days. States she has chest and throat pain due to cough. Pt also states she has neck pain. Took motrin yesterday afternoon with minimal reduction in symptoms. Pt is alert and oriented x4. Unsure if she has been around anyone who has tested positive for COVID19.

## 2019-01-01 NOTE — ED Notes (Signed)
Pt given a water and tolerating well at this time.

## 2019-01-01 NOTE — Discharge Instructions (Addendum)
Take Tylenol or Ibuprofen for fever Take over the counter medicine for cough Please return if you are worsening

## 2019-01-02 LAB — NOVEL CORONAVIRUS, NAA (HOSP ORDER, SEND-OUT TO REF LAB; TAT 18-24 HRS): SARS-CoV-2, NAA: DETECTED — AB

## 2019-04-09 ENCOUNTER — Ambulatory Visit: Payer: 59 | Admitting: Podiatry

## 2019-04-19 ENCOUNTER — Ambulatory Visit (INDEPENDENT_AMBULATORY_CARE_PROVIDER_SITE_OTHER): Payer: 59

## 2019-04-19 ENCOUNTER — Encounter (HOSPITAL_COMMUNITY): Payer: Self-pay

## 2019-04-19 ENCOUNTER — Ambulatory Visit (HOSPITAL_COMMUNITY)
Admission: EM | Admit: 2019-04-19 | Discharge: 2019-04-19 | Disposition: A | Discharge status: Home / Self Care | Payer: 59 | Attending: Family Medicine | Admitting: Family Medicine

## 2019-04-19 ENCOUNTER — Other Ambulatory Visit: Payer: Self-pay

## 2019-04-19 DIAGNOSIS — Z3202 Encounter for pregnancy test, result negative: Secondary | ICD-10-CM

## 2019-04-19 DIAGNOSIS — R1084 Generalized abdominal pain: Secondary | ICD-10-CM

## 2019-04-19 LAB — COMPREHENSIVE METABOLIC PANEL
ALT: 70 U/L — ABNORMAL HIGH (ref 0–44)
AST: 65 U/L — ABNORMAL HIGH (ref 15–41)
Albumin: 3.8 g/dL (ref 3.5–5.0)
Alkaline Phosphatase: 46 U/L (ref 38–126)
Anion gap: 9 (ref 5–15)
BUN: 14 mg/dL (ref 6–20)
CO2: 25 mmol/L (ref 22–32)
Calcium: 9.3 mg/dL (ref 8.9–10.3)
Chloride: 106 mmol/L (ref 98–111)
Creatinine, Ser: 0.72 mg/dL (ref 0.44–1.00)
GFR calc Af Amer: >60 mL/min (ref >60)
GFR calc non Af Amer: >60 mL/min (ref >60)
Glucose, Bld: 106 mg/dL — ABNORMAL HIGH (ref 70–99)
Potassium: 4 mmol/L (ref 3.5–5.1)
Sodium: 140 mmol/L (ref 135–145)
Total Bilirubin: 0.4 mg/dL (ref 0.3–1.2)
Total Protein: 7 g/dL (ref 6.5–8.1)

## 2019-04-19 LAB — CBC WITH DIFFERENTIAL/PLATELET
Abs Immature Granulocytes: 0.01 10*3/uL (ref 0.00–0.07)
Basophils Absolute: 0 10*3/uL (ref 0.0–0.1)
Basophils Relative: 0 %
Eosinophils Absolute: 0 10*3/uL (ref 0.0–0.5)
Eosinophils Relative: 1 %
HCT: 38.5 % (ref 36.0–46.0)
Hemoglobin: 13.1 g/dL (ref 12.0–15.0)
Immature Granulocytes: 0 %
Lymphocytes Relative: 40 %
Lymphs Abs: 1.1 10*3/uL (ref 0.7–4.0)
MCH: 30.6 pg (ref 26.0–34.0)
MCHC: 34 g/dL (ref 30.0–36.0)
MCV: 90 fL (ref 80.0–100.0)
Monocytes Absolute: 0.2 10*3/uL (ref 0.1–1.0)
Monocytes Relative: 9 %
Neutro Abs: 1.3 10*3/uL — ABNORMAL LOW (ref 1.7–7.7)
Neutrophils Relative %: 50 %
Platelets: 120 10*3/uL — ABNORMAL LOW (ref 150–400)
RBC: 4.28 MIL/uL (ref 3.87–5.11)
RDW: 13.1 % (ref 11.5–15.5)
WBC: 2.7 10*3/uL — ABNORMAL LOW (ref 4.0–10.5)
nRBC: 0 % (ref 0.0–0.2)

## 2019-04-19 LAB — POCT URINALYSIS DIP (DEVICE)
Bilirubin Urine: NEGATIVE
Glucose, UA: NEGATIVE mg/dL
Ketones, ur: NEGATIVE mg/dL
Leukocytes,Ua: NEGATIVE
Nitrite: NEGATIVE
Protein, ur: NEGATIVE mg/dL
Specific Gravity, Urine: >=1.03 (ref 1.005–1.030)
Urobilinogen, UA: 0.2 mg/dL (ref 0.0–1.0)
pH: 5.5 (ref 5.0–8.0)

## 2019-04-19 LAB — LIPASE, BLOOD: Lipase: 42 U/L (ref 11–51)

## 2019-04-19 LAB — POCT PREGNANCY, URINE: Preg Test, Ur: NEGATIVE

## 2019-04-19 LAB — AMYLASE: Amylase: 81 U/L (ref 28–100)

## 2019-04-19 MED ORDER — ACETAMINOPHEN 325 MG PO TABS
ORAL_TABLET | ORAL | Status: AC
  Filled 2019-04-19: qty 3

## 2019-04-19 MED ORDER — ACETAMINOPHEN 325 MG PO TABS
975.0000 mg | ORAL_TABLET | Freq: Once | ORAL | Status: AC
  Administered 2019-04-19: 14:00:00 975 mg via ORAL

## 2019-04-19 NOTE — Discharge Instructions (Addendum)

## 2019-04-19 NOTE — ED Provider Notes (Signed)
[facility]   [id number] 04/19/19 Arrival Time: [id number]  ASSESSMENT & PLAN:  1. Generalized abdominal pain     I have personally viewed the imaging studies ordered this visit. No acute findings. No sign of SBO.  Discharge to home with close observation. Overall benign abdominal exam. Awaiting blood work. UPT negative. U/A with small hematuria. She does reports a h/o of kidney stone but reports current symptoms different.  We will contact her with any significant lab abnormalities and direct further management if needed.   Follow-up Information    [facility].   Specialty: Emergency Medicine Why: If symptoms worsen in any way. Contact information: [address] [id number] [place] [phone]          Meds ordered this encounter  Medications  . acetaminophen (TYLENOL) tablet 975 mg     Discharge Instructions     You have been seen today for abdominal pain. Your evaluation was not suggestive of any emergent condition requiring medical intervention at this time. However, some abdominal problems make take more time to appear. Therefore, it is very important for you to pay attention to any new symptoms or worsening of your current condition.  Please return here or to the Emergency Department immediately should you begin to feel worse in any way or have any of the following symptoms: increasing or different abdominal pain, persistent vomiting, inability to drink fluids, fevers, or shaking chills.          Reviewed expectations re: course of current medical issues. Questions answered. Outlined signs and symptoms indicating need for more acute intervention. Patient verbalized understanding. After Visit Summary given.   SUBJECTIVE: History from: patient. Brenda Cannon is a 38 y.o. female who presents with complaint of intermittent generalized (but seems to describe more L lower)  abdominal discomfort. Onset gradual, 3-4 d ago. Discomfort described as aching and burning; without radiation; does not wake her at night. Symptoms are gradually worsening since beginning. Fever: absent. Aggravating factors: have not been identified. Alleviating factors: have not been identified. Associated symptoms: mild fatigue. She denies anorexia, arthralgias, belching, chills, constipation, diarrhea, headache, hematuria, myalgias and sweats. Appetite: normal. PO intake: normal without n/v. Reports pain is neither exacerbated nor improved after eating. Ambulatory without assistance. Urinary symptoms: questions mild dysuria. Without vaginal discharge. Reports that she is not sexually active. Bowel movements: have not significantly changed; last bowel movement within the past 24 hours and without blood. H/O occasional GERD symptoms "but this feels different". History of similar: distant h/o similar abdominal pain; saw medical provider; doesn't remember what was done. OTC treatment: none reported. Reports no frequent/heavy alcohol use. No illicit drug use.  No LMP recorded.   Past Surgical History:  Procedure Laterality Date  . CESAREAN SECTION  2008   last pregnacy  . CESAREAN SECTION  08/21/2011   Procedure: CESAREAN SECTION;  Surgeon: Donnamae Jude, MD;  Location: [facility];  Service: Gynecology;  Laterality: N/A;    ROS: As per HPI. All other systems negative.  OBJECTIVE:  Vitals:   04/19/19 1203  BP: (!) 129/98  Pulse: 68  Resp: 16  Temp: 98.3 F (36.8 C)  TempSrc: Oral  SpO2: 100%    General appearance: alert, oriented, no acute distress HEENT: [place]; AT; oropharynx moist Lungs: clear to auscultation bilaterally; unlabored respirations Heart: regular rate and rhythm Abdomen: soft; without distention; mild  and poorly localized tenderness to palpation over her L abdomen "but it kind  of hurts all over"; normal bowel sounds; without masses or organomegaly; without guarding or rebound  tenderness Back: without CVA tenderness; FROM at waist Extremities: without LE edema; symmetrical; without gross deformities Skin: warm and dry Neurologic: normal gait Psychological: alert and cooperative; normal mood and affect  Labs: Results for orders placed or performed during the hospital encounter of 04/19/19  POCT urinalysis dip (device)  Result Value Ref Range   Glucose, UA NEGATIVE NEGATIVE mg/dL   Bilirubin Urine NEGATIVE NEGATIVE   Ketones, ur NEGATIVE NEGATIVE mg/dL   Specific Gravity, Urine >=1.030 1.005 - 1.030   Hgb urine dipstick SMALL (A) NEGATIVE   pH 5.5 5.0 - 8.0   Protein, ur NEGATIVE NEGATIVE mg/dL   Urobilinogen, UA 0.2 0.0 - 1.0 mg/dL   Nitrite NEGATIVE NEGATIVE   Leukocytes,Ua NEGATIVE NEGATIVE  Pregnancy, urine POC  Result Value Ref Range   Preg Test, Ur NEGATIVE NEGATIVE   Labs Reviewed  POCT URINALYSIS DIP (DEVICE) - Abnormal; Notable for the following components:      Result Value   Hgb urine dipstick SMALL (*)    All other components within normal limits  CBC WITH DIFFERENTIAL/PLATELET  COMPREHENSIVE METABOLIC PANEL  AMYLASE  LIPASE, BLOOD  POC URINE PREG, ED  POCT PREGNANCY, URINE    Imaging: Dg Abd 2 Views  Result Date: 04/19/2019 CLINICAL DATA:  Acute left lower quadrant abdominal pain. EXAM: ABDOMEN - 2 VIEW COMPARISON:  None. FINDINGS: The bowel gas pattern is normal. There is no evidence of free air. No radio-opaque calculi or other significant radiographic abnormality is seen. IMPRESSION: No evidence of bowel obstruction or ileus. Electronically Signed   By: Marijo Conception M.D.   On: 04/19/2019 13:15     Allergies  Allergen Reactions  . Cephalexin Itching and Rash  . Chloroquine Hives                                               Past Medical History:  Diagnosis Date  . Abnormal Pap smear    ascus with High Risk for HPV  . De Quervain's tenosynovitis, left   . Difficulty reading   . Gestational diabetes    GDM with last  pregnancy (2008)  . History of gestational diabetes   . History of hepatitis B 2007  . History of malaria 2003  . History of thrombocytopenia   . Obese    Social History   Socioeconomic History  . Marital status: Single    Spouse name: Not on file  . Number of children: Not on file  . Years of education: Not on file  . Highest education level: Not on file  Occupational History  . Not on file  Social Needs  . Financial resource strain: Not on file  . Food insecurity    Worry: Not on file    Inability: Not on file  . Transportation needs    Medical: Not on file    Non-medical: Not on file  Tobacco Use  . Smoking status: Never Smoker  . Smokeless tobacco: Never Used  Substance and Sexual Activity  . Alcohol use: No  . Drug use: No  . Sexual activity: Not on file  Lifestyle  . Physical activity    Days per week: Not on file    Minutes per session: Not on file  . Stress: Not on file  Relationships  . Social Herbalist on phone: Not on file    Gets together: Not on file    Attends religious service: Not on file    Active member of club or organization: Not on file    Attends meetings of clubs or organizations: Not on file    Relationship status: Not on file  . Intimate partner violence    Fear of current or ex partner: Not on file    Emotionally abused: Not on file    Physically abused: Not on file    Forced sexual activity: Not on file  Other Topics Concern  . Not on file  Social History Narrative   ** Merged History Encounter **       Family History  Problem Relation Age of Onset  . Hypertension Mother   . Stroke Mother      Vanessa Kick, MD 04/19/19 1400

## 2019-04-19 NOTE — ED Triage Notes (Signed)
Pt present abdominal pain and burning sensation while urinating. Symptoms started 3 days ago.

## 2019-04-21 ENCOUNTER — Ambulatory Visit (INDEPENDENT_AMBULATORY_CARE_PROVIDER_SITE_OTHER): Payer: 59

## 2019-04-21 ENCOUNTER — Other Ambulatory Visit: Payer: Self-pay

## 2019-04-21 ENCOUNTER — Ambulatory Visit (INDEPENDENT_AMBULATORY_CARE_PROVIDER_SITE_OTHER): Payer: 59 | Admitting: Podiatry

## 2019-04-21 ENCOUNTER — Other Ambulatory Visit: Payer: Self-pay | Admitting: Podiatry

## 2019-04-21 DIAGNOSIS — M722 Plantar fascial fibromatosis: Secondary | ICD-10-CM

## 2019-04-21 DIAGNOSIS — M79671 Pain in right foot: Secondary | ICD-10-CM

## 2019-04-21 DIAGNOSIS — M79672 Pain in left foot: Secondary | ICD-10-CM

## 2019-04-21 MED ORDER — MELOXICAM 15 MG PO TABS: 15.0000 mg | ORAL_TABLET | Freq: Every day | ORAL | 1 refills | Status: DC

## 2019-04-21 MED ORDER — METHYLPREDNISOLONE 4 MG PO TBPK: ORAL_TABLET | ORAL | 0 refills | Status: DC

## 2019-04-22 ENCOUNTER — Telehealth (HOSPITAL_COMMUNITY): Payer: Self-pay | Admitting: Emergency Medicine

## 2019-04-22 NOTE — Telephone Encounter (Signed)
Per Dr. Mannie Stabile, pt needs follow up due to white blood cell count dropping, and decrease in neutrophils. She is at higher risk of infections. Needs follow up with PCP asap. Attempted to reach patient. No answer at this time. Voicemail left.

## 2019-04-25 NOTE — Progress Notes (Signed)
   Subjective: 38 y.o. female presenting today as a new patient with a chief complaint of left plantar heel pain that began about 8 months ago. She describes the pain as a pulling sensation. She has not had any treatment. Walking and being on the foot increases the pain. Patient is here for further evaluation and treatment.    Past Medical History:  Diagnosis Date  . Abnormal Pap smear    ascus with High Risk for HPV  . De Quervain's tenosynovitis, left   . Difficulty reading   . Gestational diabetes    GDM with last pregnancy (2008)  . History of gestational diabetes   . History of hepatitis B 2007  . History of malaria 2003  . History of thrombocytopenia   . Obese      Objective: Physical Exam General: The patient is alert and oriented x3 in no acute distress.  Dermatology: Skin is warm, dry and supple bilateral lower extremities. Negative for open lesions or macerations bilateral.   Vascular: Dorsalis Pedis and Posterior Tibial pulses palpable bilateral.  Capillary fill time is immediate to all digits.  Neurological: Epicritic and protective threshold intact bilateral.   Musculoskeletal: Tenderness to palpation to the plantar aspect of the left heel along the plantar fascia. All other joints range of motion within normal limits bilateral. Strength 5/5 in all groups bilateral.   Radiographic exam: Normal osseous mineralization. Joint spaces preserved. No fracture/dislocation/boney destruction. No other soft tissue abnormalities or radiopaque foreign bodies.   Assessment: 1. Plantar fasciitis left foot medial and lateral   Plan of Care:  1. Patient evaluated. Xrays reviewed.   2. Injection of 0.5cc Celestone soluspan injected into the left plantar fascia medial and lateral.  3. Rx for Medrol Dose Pak placed 4. Rx for Meloxicam ordered for patient. 5. Plantar fascial band(s) dispensed  6. Instructed patient regarding therapies and modalities at home to alleviate symptoms.   7. Return to clinic in 4 weeks.    Works at [employer] in [place]. From [place].    Edrick Kins, DPM [facility]  Dr. Edrick Kins, DPM    [address], [place] [address]                Office [phone]  Fax [phone]

## 2019-04-26 ENCOUNTER — Telehealth (HOSPITAL_COMMUNITY): Payer: Self-pay | Admitting: Emergency Medicine

## 2019-04-26 NOTE — Telephone Encounter (Signed)
Attempted to reach patient x2. No answer at this time. Voicemail left.    

## 2019-04-26 NOTE — Telephone Encounter (Signed)
Pt called given results and instruction about lab work. All questions answered.

## 2019-05-18 ENCOUNTER — Other Ambulatory Visit: Payer: Self-pay

## 2019-05-18 ENCOUNTER — Encounter (HOSPITAL_COMMUNITY): Payer: Self-pay | Admitting: Emergency Medicine

## 2019-05-18 ENCOUNTER — Emergency Department (HOSPITAL_COMMUNITY)
Admission: EM | Admit: 2019-05-18 | Discharge: 2019-05-18 | Disposition: A | Discharge status: Home / Self Care | Payer: 59 | Attending: Emergency Medicine | Admitting: Emergency Medicine

## 2019-05-18 DIAGNOSIS — R1030 Lower abdominal pain, unspecified: Secondary | ICD-10-CM | POA: Diagnosis not present

## 2019-05-18 DIAGNOSIS — E119 Type 2 diabetes mellitus without complications: Secondary | ICD-10-CM | POA: Insufficient documentation

## 2019-05-18 DIAGNOSIS — R10815 Periumbilic abdominal tenderness: Secondary | ICD-10-CM | POA: Insufficient documentation

## 2019-05-18 DIAGNOSIS — R319 Hematuria, unspecified: Secondary | ICD-10-CM | POA: Diagnosis present

## 2019-05-18 DIAGNOSIS — R310 Gross hematuria: Secondary | ICD-10-CM | POA: Diagnosis not present

## 2019-05-18 LAB — COMPREHENSIVE METABOLIC PANEL
ALT: 87 U/L — ABNORMAL HIGH (ref 0–44)
AST: 78 U/L — ABNORMAL HIGH (ref 15–41)
Albumin: 3.5 g/dL (ref 3.5–5.0)
Alkaline Phosphatase: 56 U/L (ref 38–126)
Anion gap: 7 (ref 5–15)
BUN: 10 mg/dL (ref 6–20)
CO2: 25 mmol/L (ref 22–32)
Calcium: 8.8 mg/dL — ABNORMAL LOW (ref 8.9–10.3)
Chloride: 109 mmol/L (ref 98–111)
Creatinine, Ser: 0.68 mg/dL (ref 0.44–1.00)
GFR calc Af Amer: >60 mL/min (ref >60)
GFR calc non Af Amer: >60 mL/min (ref >60)
Glucose, Bld: 110 mg/dL — ABNORMAL HIGH (ref 70–99)
Potassium: 3.5 mmol/L (ref 3.5–5.1)
Sodium: 141 mmol/L (ref 135–145)
Total Bilirubin: 0.6 mg/dL (ref 0.3–1.2)
Total Protein: 6.6 g/dL (ref 6.5–8.1)

## 2019-05-18 LAB — CBC WITH DIFFERENTIAL/PLATELET
Abs Immature Granulocytes: 0.01 10*3/uL (ref 0.00–0.07)
Basophils Absolute: 0 10*3/uL (ref 0.0–0.1)
Basophils Relative: 0 %
Eosinophils Absolute: 0.1 10*3/uL (ref 0.0–0.5)
Eosinophils Relative: 3 %
HCT: 39.1 % (ref 36.0–46.0)
Hemoglobin: 12.4 g/dL (ref 12.0–15.0)
Immature Granulocytes: 0 %
Lymphocytes Relative: 38 %
Lymphs Abs: 1.2 10*3/uL (ref 0.7–4.0)
MCH: 29.2 pg (ref 26.0–34.0)
MCHC: 31.7 g/dL (ref 30.0–36.0)
MCV: 92.2 fL (ref 80.0–100.0)
Monocytes Absolute: 0.3 10*3/uL (ref 0.1–1.0)
Monocytes Relative: 11 %
Neutro Abs: 1.6 10*3/uL — ABNORMAL LOW (ref 1.7–7.7)
Neutrophils Relative %: 48 %
Platelets: 120 10*3/uL — ABNORMAL LOW (ref 150–400)
RBC: 4.24 MIL/uL (ref 3.87–5.11)
RDW: 13.2 % (ref 11.5–15.5)
WBC: 3.2 10*3/uL — ABNORMAL LOW (ref 4.0–10.5)
nRBC: 0 % (ref 0.0–0.2)

## 2019-05-18 LAB — URINALYSIS, ROUTINE W REFLEX MICROSCOPIC
Bacteria, UA: NONE SEEN
Bilirubin Urine: NEGATIVE
Glucose, UA: NEGATIVE mg/dL
Ketones, ur: NEGATIVE mg/dL
Leukocytes,Ua: NEGATIVE
Nitrite: NEGATIVE
Protein, ur: NEGATIVE mg/dL
RBC / HPF: >50 RBC/hpf — ABNORMAL HIGH (ref 0–5)
Specific Gravity, Urine: 1.021 (ref 1.005–1.030)
pH: 6 (ref 5.0–8.0)

## 2019-05-18 LAB — I-STAT BETA HCG BLOOD, ED (MC, WL, AP ONLY): I-stat hCG, quantitative: <5 m[IU]/mL (ref <5)

## 2019-05-18 NOTE — ED Provider Notes (Signed)
[facility] EMERGENCY DEPARTMENT Provider Note   CSN: [id number] Arrival date & time: 05/18/19  0359     History   Chief Complaint Chief Complaint  Patient presents with  . Hematuria  . Abdominal Pain    HPI Brenda Cannon is a 38 y.o. female w PMHx hep B, T2DM, presenting to the ED with complaint of hematuria that began yesterday. Pt reports she woke up yesterday morning with hematuria and diffuse lower abd pain. This is not the first occurrence of her symptoms, she was evaluated last month at [facility] for the same. She states sx resolved on their own, though returned yesterday.  She has not been evaluated by her PCP for this. LMP 05/14/2019. States she is not currently sexually active for at least 1 year. Denies assoc fever, N/V, diarrhea, malodorous urine, dysuria.  She has not treated her symptoms with any medications.     The history is provided by the patient and medical records.    Past Medical History:  Diagnosis Date  . Abnormal Pap smear    ascus with High Risk for HPV  . De Quervain's tenosynovitis, left   . Difficulty reading   . Gestational diabetes    GDM with last pregnancy (2008)  . History of gestational diabetes   . History of hepatitis B 2007  . History of malaria 2003  . History of thrombocytopenia   . Obese     Patient Active Problem List   Diagnosis Date Noted  . Diabetes mellitus type 2 in obese ([place]) 03/24/2015  . Metatarsal stress fracture of left foot 11/10/2013  . Allergic rhinitis 05/26/2012  . 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 History of malaria 03/04/2011  . Hepatitis B infection 03/01/2011  . H/O: C-section 03/01/2011  . History of thrombocytopenia 03/01/2011    Past Surgical History:  Procedure Laterality Date  . CESAREAN SECTION  2008   last pregnacy  . CESAREAN SECTION  08/21/2011   Procedure: CESAREAN SECTION;  Surgeon: Donnamae Jude, MD;  Location: [facility];  Service: Gynecology;  Laterality: N/A;      OB History    Gravida  5   Para  3   Term  3   Preterm  0   AB  2   Living  3     SAB  1   TAB      Ectopic  0   Multiple  0   Live Births  3            Home Medications    Prior to Admission medications   Medication Sig Start Date End Date Taking? Authorizing Provider  meloxicam (MOBIC) 15 MG tablet Take 1 tablet (15 mg total) by mouth daily. 04/21/19   Edrick Kins, DPM  methocarbamol (ROBAXIN) 500 MG tablet Take 1 tablet (500 mg total) by mouth every 8 (eight) hours as needed for muscle spasms. 12/22/18   Petrucelli, Glynda Jaeger, PA-C  methylPREDNISolone (MEDROL DOSEPAK) 4 MG TBPK tablet 6 day dose pack - take as directed 04/21/19   Edrick Kins, DPM  naproxen (NAPROSYN) 500 MG tablet Take 1 tablet (500 mg total) by mouth 2 (two) times daily. 12/22/18   Petrucelli, Samantha R, PA-C  fluticasone (FLONASE) 50 MCG/ACT nasal spray Place 2 sprays into both nostrils daily. Patient not taking: Reported on 12/22/2018 10/08/18 12/22/18  Lestine Box, PA-C    Family History Family History  Problem Relation Age of Onset  .  Hypertension Mother   . Stroke Mother     Social History Social History   Tobacco Use  . Smoking status: Never Smoker  . Smokeless tobacco: Never Used  Substance Use Topics  . Alcohol use: No  . Drug use: No     Allergies   Cephalexin and Chloroquine   Review of Systems Review of Systems  All other systems reviewed and are negative.    Physical Exam Updated Vital Signs BP (!) 130/97   Pulse 66   Temp 98.4 F (36.9 C) (Oral)   Resp 16   LMP 05/14/2019 (Exact Date)   SpO2 96%   Physical Exam Vitals signs and nursing note reviewed.  Constitutional:      General: She is not in acute distress.    Appearance: She is well-developed. She is obese. She is not ill-appearing.  HENT:     Head: Normocephalic and atraumatic.  Eyes:     Conjunctiva/sclera: Conjunctivae normal.  Cardiovascular:     Rate and Rhythm: Normal  rate and regular rhythm.  Pulmonary:     Effort: Pulmonary effort is normal. No respiratory distress.     Breath sounds: Normal breath sounds.  Abdominal:     General: Bowel sounds are normal.     Palpations: Abdomen is soft.     Tenderness: There is abdominal tenderness in the suprapubic area. There is no guarding or rebound.  Skin:    General: Skin is warm.  Neurological:     Mental Status: She is alert.  Psychiatric:        Behavior: Behavior normal.      ED Treatments / Results  Labs (all labs ordered are listed, but only abnormal results are displayed) Labs Reviewed  URINALYSIS, ROUTINE W REFLEX MICROSCOPIC - Abnormal; Notable for the following components:      Result Value   APPearance HAZY (*)    Hgb urine dipstick LARGE (*)    RBC / HPF >50 (*)    All other components within normal limits  COMPREHENSIVE METABOLIC PANEL - Abnormal; Notable for the following components:   Glucose, Bld 110 (*)    Calcium 8.8 (*)    AST 78 (*)    ALT 87 (*)    All other components within normal limits  CBC WITH DIFFERENTIAL/PLATELET - Abnormal; Notable for the following components:   WBC 3.2 (*)    Platelets 120 (*)    Neutro Abs 1.6 (*)    All other components within normal limits  URINE CULTURE  I-STAT BETA HCG BLOOD, ED ([facility], [facility], [facility] ONLY)    EKG None  Radiology No results found.  Procedures Procedures (including critical care time)  Medications Ordered in ED Medications - No data to display   Initial Impression / Assessment and Plan / ED Course  I have reviewed the triage vital signs and the nursing notes.  Pertinent labs & imaging results that were available during my care of the patient were reviewed by me and considered in my medical decision making (see chart for details).        Patient presenting with waxing and waning lower abdominal pain with gross hematuria for at least 1 month.  She is evaluated by urgent care last month with reassuring work-up, however  she is not followed up with PCP.  Patient is presenting today well-appearing and in no distress.  Abdomen soft though does have suprapubic abdominal tenderness, known peritoneal signs.  There is no suspicion for pelvic etiology of complaints  as she is not sexually active and having normal menstruation.  UA with large blood, no signs of infection.  Discussed with Dr. Johnney Killian who believes CT imaging will be beneficial today for further evaluation.  Patient's lab work appears to be at patient's baseline.  Negative hCG.  Pending CT scan, patient states she needed to leave to bring her daughter to work and can no longer stay.  Discussed importance of close outpatient follow-up and strict return precautions should symptoms worsen.  She verbalized understanding.  Discussed results, findings, treatment and follow up. Patient advised of return precautions. Patient verbalized understanding and agreed with plan.   Final Clinical Impressions(s) / ED Diagnoses   Final diagnoses:  Gross hematuria  Lower abdominal pain    ED Discharge Orders    None       Evelynne Spiers, [place], [place] 05/18/19 1247    Charlesetta Shanks, MD 05/21/19 1248

## 2019-05-18 NOTE — Discharge Instructions (Signed)
We were planning for CT scan today, however you needed to leave prior to your imaging.  Your lab work looks reassuring.  It is commended that you follow closely with your primary care provider regarding your visit today. Return to the emergency department you develop fever, severely worsening pain or new or concerning symptoms.

## 2019-05-18 NOTE — ED Triage Notes (Signed)
Patient states that she started with pelvic pain and blood in her urine yesterday.  She states that the pelvic pain has gotten worse since yesterday.

## 2019-05-19 LAB — URINE CULTURE

## 2019-05-24 ENCOUNTER — Ambulatory Visit: Payer: 59 | Admitting: Podiatry

## 2019-06-14 ENCOUNTER — Ambulatory Visit: Payer: 59 | Admitting: Podiatry

## 2019-08-06 ENCOUNTER — Other Ambulatory Visit: Payer: Self-pay | Admitting: Gastroenterology

## 2019-08-06 DIAGNOSIS — R945 Abnormal results of liver function studies: Secondary | ICD-10-CM

## 2019-08-06 DIAGNOSIS — R7989 Other specified abnormal findings of blood chemistry: Secondary | ICD-10-CM

## 2019-08-10 ENCOUNTER — Ambulatory Visit
Admission: RE | Admit: 2019-08-10 | Discharge: 2019-08-10 | Disposition: A | Discharge status: Home / Self Care | Payer: 59 | Source: Ambulatory Visit | Attending: Gastroenterology | Admitting: Gastroenterology

## 2019-08-10 DIAGNOSIS — R7989 Other specified abnormal findings of blood chemistry: Secondary | ICD-10-CM

## 2019-08-10 DIAGNOSIS — R945 Abnormal results of liver function studies: Secondary | ICD-10-CM

## 2019-08-17 ENCOUNTER — Other Ambulatory Visit: Payer: Self-pay | Admitting: Gastroenterology

## 2019-08-17 DIAGNOSIS — R7989 Other specified abnormal findings of blood chemistry: Secondary | ICD-10-CM

## 2019-08-17 DIAGNOSIS — R945 Abnormal results of liver function studies: Secondary | ICD-10-CM

## 2019-08-17 DIAGNOSIS — B181 Chronic viral hepatitis B without delta-agent: Secondary | ICD-10-CM

## 2019-08-17 DIAGNOSIS — R935 Abnormal findings on diagnostic imaging of other abdominal regions, including retroperitoneum: Secondary | ICD-10-CM

## 2019-10-09 ENCOUNTER — Other Ambulatory Visit: Payer: 59

## 2019-11-07 ENCOUNTER — Emergency Department (HOSPITAL_COMMUNITY)
Admission: EM | Admit: 2019-11-07 | Discharge: 2019-11-07 | Disposition: A | Discharge status: Home / Self Care | Payer: 59 | Attending: Emergency Medicine | Admitting: Emergency Medicine

## 2019-11-07 ENCOUNTER — Emergency Department (HOSPITAL_COMMUNITY): Payer: 59

## 2019-11-07 ENCOUNTER — Encounter (HOSPITAL_COMMUNITY): Payer: Self-pay | Admitting: Emergency Medicine

## 2019-11-07 ENCOUNTER — Other Ambulatory Visit: Payer: Self-pay

## 2019-11-07 DIAGNOSIS — R300 Dysuria: Secondary | ICD-10-CM | POA: Diagnosis present

## 2019-11-07 DIAGNOSIS — Z79899 Other long term (current) drug therapy: Secondary | ICD-10-CM | POA: Insufficient documentation

## 2019-11-07 DIAGNOSIS — E119 Type 2 diabetes mellitus without complications: Secondary | ICD-10-CM | POA: Diagnosis not present

## 2019-11-07 DIAGNOSIS — N200 Calculus of kidney: Secondary | ICD-10-CM | POA: Diagnosis not present

## 2019-11-07 LAB — I-STAT CHEM 8, ED
BUN: 15 mg/dL (ref 6–20)
Calcium, Ion: 1.21 mmol/L (ref 1.15–1.40)
Chloride: 106 mmol/L (ref 98–111)
Creatinine, Ser: 0.7 mg/dL (ref 0.44–1.00)
Glucose, Bld: 118 mg/dL — ABNORMAL HIGH (ref 70–99)
HCT: 35 % — ABNORMAL LOW (ref 36.0–46.0)
Hemoglobin: 11.9 g/dL — ABNORMAL LOW (ref 12.0–15.0)
Potassium: 3.8 mmol/L (ref 3.5–5.1)
Sodium: 144 mmol/L (ref 135–145)
TCO2: 28 mmol/L (ref 22–32)

## 2019-11-07 LAB — URINALYSIS, ROUTINE W REFLEX MICROSCOPIC
Bilirubin Urine: NEGATIVE
Glucose, UA: NEGATIVE mg/dL
Ketones, ur: 5 mg/dL — AB
Leukocytes,Ua: NEGATIVE
Nitrite: NEGATIVE
Protein, ur: 30 mg/dL — AB
RBC / HPF: >50 RBC/hpf — ABNORMAL HIGH (ref 0–5)
Specific Gravity, Urine: 1.028 (ref 1.005–1.030)
pH: 6 (ref 5.0–8.0)

## 2019-11-07 LAB — PREGNANCY, URINE: Preg Test, Ur: NEGATIVE

## 2019-11-07 MED ORDER — ACETAMINOPHEN 500 MG PO TABS
1000.0000 mg | ORAL_TABLET | Freq: Once | ORAL | Status: AC
  Administered 2019-11-07: 1000 mg via ORAL
  Filled 2019-11-07: qty 2

## 2019-11-07 MED ORDER — HYDROCODONE-ACETAMINOPHEN 5-325 MG PO TABS: 2.0000 | ORAL_TABLET | ORAL | 0 refills | Status: DC | PRN

## 2019-11-07 MED ORDER — TAMSULOSIN HCL 0.4 MG PO CAPS: 0.4000 mg | ORAL_CAPSULE | Freq: Every day | ORAL | 0 refills | Status: AC

## 2019-11-07 NOTE — ED Triage Notes (Signed)
Pt reports that her cycle was the end of April and after it stopped started having burning with urination (states around 3-4 days).

## 2019-11-07 NOTE — Discharge Instructions (Signed)
You can take Tylenol or Ibuprofen as directed for pain. You can alternate Tylenol and Ibuprofen every 4 hours. If you take Tylenol at 1pm, then you can take Ibuprofen at 5pm. Then you can take Tylenol again at 9pm.   Take pain medications as directed for break through pain. Do not drive or operate machinery while taking this medication.  Take Flomax as directed.  Follow-up with urology.  Additionally, your CT scan showed a small discolored lesion of about 1.1 cm on the liver.  This needs to be followed up by primary care doctor.  It was most likely not causing her symptoms today and is just an incidental finding.  Return to the emergency department for any worsening pain, fever, vomiting or any other worsening or concerning symptoms.

## 2019-11-07 NOTE — ED Provider Notes (Signed)
[facility] DEPT Provider Note   CSN: [id number] Arrival date & time: 11/07/19  1555     History Chief Complaint  Patient presents with  . Dysuria    Brenda Cannon is a 39 y.o. female who presents for evaluation of dysuria x3 to 4 days.  Patient reports that when she urinates, she has a slight pressure in the suprapubic area of her abdomen.  Otherwise does not have any other abdominal pain.  She states initially when symptoms began she noticed some blood but since then has not had any more hematuria.  She states that she has not had any fevers, nausea/vomiting.  Denies any chest pain, difficulty breathing.  The history is provided by the patient.       Past Medical History:  Diagnosis Date  . Abnormal Pap smear    ascus with High Risk for HPV  . De Quervain's tenosynovitis, left   . Difficulty reading   . Gestational diabetes    GDM with last pregnancy (2008)  . History of gestational diabetes   . History of hepatitis B 2007  . History of malaria 2003  . History of thrombocytopenia   . Obese     Patient Active Problem List   Diagnosis Date Noted  . Diabetes mellitus type 2 in obese ([place]) 03/24/2015  . Metatarsal stress fracture of left foot 11/10/2013  . Allergic rhinitis 05/26/2012  . 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 History of malaria 03/04/2011  . Hepatitis B infection 03/01/2011  . H/O: C-section 03/01/2011  . History of thrombocytopenia 03/01/2011    Past Surgical History:  Procedure Laterality Date  . CESAREAN SECTION  2008   last pregnacy  . CESAREAN SECTION  08/21/2011   Procedure: CESAREAN SECTION;  Surgeon: Donnamae Jude, MD;  Location: [facility];  Service: Gynecology;  Laterality: N/A;     OB History    Gravida  5   Para  3   Term  3   Preterm  0   AB  2   Living  3     SAB  1   TAB      Ectopic  0   Multiple  0   Live Births  3           Family History  Problem Relation Age  of Onset  . Hypertension Mother   . Stroke Mother     Social History   Tobacco Use  . Smoking status: Never Smoker  . Smokeless tobacco: Never Used  Substance Use Topics  . Alcohol use: No  . Drug use: No    Home Medications Prior to Admission medications   Medication Sig Start Date End Date Taking? Authorizing Provider  HYDROcodone-acetaminophen (NORCO/VICODIN) 5-325 MG tablet Take 2 tablets by mouth every 4 (four) hours as needed. 11/07/19   Volanda Napoleon, PA-C  meloxicam (MOBIC) 15 MG tablet Take 1 tablet (15 mg total) by mouth daily. 04/21/19   Edrick Kins, DPM  methocarbamol (ROBAXIN) 500 MG tablet Take 1 tablet (500 mg total) by mouth every 8 (eight) hours as needed for muscle spasms. 12/22/18   Petrucelli, Glynda Jaeger, PA-C  methylPREDNISolone (MEDROL DOSEPAK) 4 MG TBPK tablet 6 day dose pack - take as directed 04/21/19   Edrick Kins, DPM  naproxen (NAPROSYN) 500 MG tablet Take 1 tablet (500 mg total) by mouth 2 (two) times daily. 12/22/18   Petrucelli, Samantha R, PA-C  tamsulosin (FLOMAX) 0.4  MG CAPS capsule Take 1 capsule (0.4 mg total) by mouth daily for 5 days. 11/07/19 11/12/19  Volanda Napoleon, PA-C  fluticasone (FLONASE) 50 MCG/ACT nasal spray Place 2 sprays into both nostrils daily. Patient not taking: Reported on 12/22/2018 10/08/18 12/22/18  Lestine Box, PA-C    Allergies    Cephalexin and Chloroquine  Review of Systems   Review of Systems  Constitutional: Negative for fever.  Respiratory: Negative for shortness of breath.   Cardiovascular: Negative for chest pain.  Gastrointestinal: Negative for abdominal pain, nausea and vomiting.  Genitourinary: Positive for dysuria and hematuria. Negative for flank pain.  Neurological: Negative for headaches.  All other systems reviewed and are negative.   Physical Exam Updated Vital Signs BP 120/64   Pulse 64   Temp 98.6 F (37 C) (Oral)   Resp 15   LMP 10/31/2019   SpO2 98%   Physical Exam Vitals and  nursing note reviewed.  Constitutional:      Appearance: Normal appearance. She is well-developed.     Comments: Sitting comfortably on examination table  HENT:     Head: Normocephalic and atraumatic.  Eyes:     General: Lids are normal.     Conjunctiva/sclera: Conjunctivae normal.     Pupils: Pupils are equal, round, and reactive to light.  Cardiovascular:     Rate and Rhythm: Normal rate and regular rhythm.     Pulses: Normal pulses.     Heart sounds: Normal heart sounds. No murmur. No friction rub. No gallop.   Pulmonary:     Effort: Pulmonary effort is normal.     Breath sounds: Normal breath sounds.  Abdominal:     Palpations: Abdomen is soft. Abdomen is not rigid.     Tenderness: There is no abdominal tenderness. There is no guarding.     Comments: Abdomen is soft, non-distended, non-tender. No rigidity, No guarding. No peritoneal signs. No CVA tenderness.   Musculoskeletal:        General: Normal range of motion.     Cervical back: Full passive range of motion without pain.  Skin:    General: Skin is warm and dry.     Capillary Refill: Capillary refill takes less than 2 seconds.  Neurological:     Mental Status: She is alert and oriented to person, place, and time.  Psychiatric:        Speech: Speech normal.     ED Results / Procedures / Treatments   Labs (all labs ordered are listed, but only abnormal results are displayed) Labs Reviewed  URINALYSIS, ROUTINE W REFLEX MICROSCOPIC - Abnormal; Notable for the following components:      Result Value   APPearance HAZY (*)    Hgb urine dipstick SMALL (*)    Ketones, ur 5 (*)    Protein, ur 30 (*)    RBC / HPF >50 (*)    Bacteria, UA RARE (*)    All other components within normal limits  I-STAT CHEM 8, ED - Abnormal; Notable for the following components:   Glucose, Bld 118 (*)    Hemoglobin 11.9 (*)    HCT 35.0 (*)    All other components within normal limits  URINE CULTURE  PREGNANCY, URINE     EKG None  Radiology CT Renal Stone Study  Result Date: 11/07/2019 CLINICAL DATA:  Burning with urination. Hematuria. EXAM: CT ABDOMEN AND PELVIS WITHOUT CONTRAST TECHNIQUE: Multidetector CT imaging of the abdomen and pelvis was performed following the standard protocol  without IV contrast. COMPARISON:  CT abdomen pelvis 03/10/2015 FINDINGS: Lower chest: No acute abnormality. Somewhat limited evaluation of the abdominal viscera given the lack of IV contrast. Hepatobiliary: In the anterior left hepatic lobe (series 2, image 14) there is a nonspecific hypodense lesion measuring 1.1 cm. Normal appearance of the gallbladder. Pancreas: Unremarkable. No surrounding inflammatory changes. Spleen: Normal in size without focal abnormality. Adrenals/Urinary Tract: Adrenal glands are unremarkable. The kidneys are symmetric in size. There are two renal calculi measuring up to 3 mm. No hydronephrosis. There is also a calculus at or just beyond the right UVJ measuring 4 mm. The urinary bladder is unremarkable. Stomach/Bowel: The stomach is distended by ingested material, no specific anatomic abnormality identified. There is no evidence of bowel distention or bowel wall thickening. Normal appearance of the appendix. Vascular/Lymphatic: No lymphadenopathy. Reproductive: Uterus and bilateral adnexa are unremarkable. Other: No abdominal wall hernia or abnormality. No abdominopelvic ascites. Musculoskeletal: No acute or significant osseous findings. IMPRESSION: 1. Nonobstructing right nephrolithiasis. There is a 4 mm calculus at or just beyond the right UVJ. No hydronephrosis. 2. Nonspecific 1.1 cm hypodense lesion in the anterior left hepatic lobe, incompletely characterized in the absence of IV contrast. Consider outpatient liver MRI. Electronically Signed   By: Audie Pinto M.D.   On: 11/07/2019 18:52    Procedures Procedures (including critical care time)  Medications Ordered in ED Medications  acetaminophen  (TYLENOL) tablet 1,000 mg (1,000 mg Oral Given 11/07/19 2010)    ED Course  I have reviewed the triage vital signs and the nursing notes.  Pertinent labs & imaging results that were available during my care of the patient were reviewed by me and considered in my medical decision making (see chart for details).    MDM Rules/Calculators/A&P                      39 year old female who presents for evaluation of dysuria x3 days.  She reports some hematuria initially.  No fevers, nausea/vomiting, abdominal pain.  On initially arrival, she is afebrile, nontoxic-appearing.  Vital signs are stable.  Benign abdominal exam with no tenderness.  Consider UTI.  Urine ordered at triage.  History/physical exam not concerning for appendicitis, PID, ovarian torsion.  Urine shows hemoglobin.  No evidence of leukocytosis or anemia.  Urine culture sent.  Given that she has hemoglobin as well as discomfort, will plan for CT scan for evaluation of kidney stone.  BUN and creatinine within normal limits.  CT scan shows a 4 mm kidney stone noted at the right UVJ there is also nonobstructing right kidney stone.  Additionally, she has a 1.1 cm hypodense lesion in the liver.  Recommend outpatient liver MRI.  Patient has no abdominal tenderness on exam.  She is resting comfortably.  She only has pain whenever she urinates.  Her kidney stone is on the other side so question if this is significantly causing her pain but it could definitely be contributing given that it is obstructive.  Patient states she has had kidney stones before.  We will plan to follow-up with her urologist.  Discussed results with patient.  We will plan to send her home with some pain medication for pain control.  Patient instructed to follow-up with urology.  Additionally, I discussed with patient regarding the findings of her liver today.  Instructed follow-up with primary care doctor. At this time, patient exhibits no emergent life-threatening condition  that require further evaluation in ED or admission. Patient had  ample opportunity for questions and discussion. All patient's questions were answered with full understanding. Strict return precautions discussed. Patient expresses understanding and agreement to plan.    Portions of this note were generated with [employer]. Dictation errors may occur despite best attempts at proofreading.   Final Clinical Impression(s) / ED Diagnoses Final diagnoses:  Kidney stone    Rx / DC Orders ED Discharge Orders         Ordered    tamsulosin (FLOMAX) 0.4 MG CAPS capsule  Daily     11/07/19 1942    HYDROcodone-acetaminophen (NORCO/VICODIN) 5-325 MG tablet  Every 4 hours PRN     11/07/19 1942           Desma Mcgregor 11/07/19 2035    Sherwood Gambler, MD 11/08/19 1733

## 2019-11-08 LAB — URINE CULTURE

## 2020-04-10 ENCOUNTER — Encounter (HOSPITAL_COMMUNITY): Payer: Self-pay

## 2020-04-10 ENCOUNTER — Emergency Department (HOSPITAL_COMMUNITY): Payer: 59

## 2020-04-10 ENCOUNTER — Other Ambulatory Visit: Payer: Self-pay

## 2020-04-10 ENCOUNTER — Emergency Department (HOSPITAL_COMMUNITY)
Admission: EM | Admit: 2020-04-10 | Discharge: 2020-04-10 | Disposition: A | Discharge status: Home / Self Care | Payer: 59 | Attending: Emergency Medicine | Admitting: Emergency Medicine

## 2020-04-10 DIAGNOSIS — R519 Headache, unspecified: Secondary | ICD-10-CM

## 2020-04-10 DIAGNOSIS — R200 Anesthesia of skin: Secondary | ICD-10-CM

## 2020-04-10 DIAGNOSIS — E119 Type 2 diabetes mellitus without complications: Secondary | ICD-10-CM | POA: Insufficient documentation

## 2020-04-10 LAB — CBC
HCT: 38.8 % (ref 36.0–46.0)
Hemoglobin: 12.6 g/dL (ref 12.0–15.0)
MCH: 29.4 pg (ref 26.0–34.0)
MCHC: 32.5 g/dL (ref 30.0–36.0)
MCV: 90.7 fL (ref 80.0–100.0)
Platelets: 104 10*3/uL — ABNORMAL LOW (ref 150–400)
RBC: 4.28 MIL/uL (ref 3.87–5.11)
RDW: 13.1 % (ref 11.5–15.5)
WBC: 2.6 10*3/uL — ABNORMAL LOW (ref 4.0–10.5)
nRBC: 0 % (ref 0.0–0.2)

## 2020-04-10 LAB — BASIC METABOLIC PANEL
Anion gap: 9 (ref 5–15)
BUN: 12 mg/dL (ref 6–20)
CO2: 25 mmol/L (ref 22–32)
Calcium: 8.8 mg/dL — ABNORMAL LOW (ref 8.9–10.3)
Chloride: 106 mmol/L (ref 98–111)
Creatinine, Ser: 0.71 mg/dL (ref 0.44–1.00)
GFR calc Af Amer: >60 mL/min (ref >60)
GFR calc non Af Amer: >60 mL/min (ref >60)
Glucose, Bld: 108 mg/dL — ABNORMAL HIGH (ref 70–99)
Potassium: 3.6 mmol/L (ref 3.5–5.1)
Sodium: 140 mmol/L (ref 135–145)

## 2020-04-10 LAB — MAGNESIUM: Magnesium: 2 mg/dL (ref 1.7–2.4)

## 2020-04-10 MED ORDER — METHYLPREDNISOLONE 4 MG PO TBPK: ORAL_TABLET | ORAL | 0 refills | Status: DC

## 2020-04-10 MED ORDER — KETOROLAC TROMETHAMINE 30 MG/ML IJ SOLN
15.0000 mg | Freq: Once | INTRAMUSCULAR | Status: AC
  Administered 2020-04-10: 15 mg via INTRAVENOUS
  Filled 2020-04-10: qty 1

## 2020-04-10 NOTE — ED Provider Notes (Signed)
[facility] DEPT Provider Note   CSN: [id number] Arrival date & time: 04/10/20  1761     History Chief Complaint  Patient presents with   arm numbness   Headache   Foot Pain    [place] EDINA WINNINGHAM is a 39 y.o. female.  HPI    Patient presents with concern of arm numbness and headache.  The exact temperature is unclear, but it seems as though she has over the past weeks developed a headache, posterior, sore, diffuse.  More recently she has developed numbness in her left hand, ascending.  This is more evident in the mornings, and after working, improves with rest, and after a weekend of not going to work. Minimal relief with OTC medication. No speech difficulty, no vision loss, no vomiting, no other complaints.    Past Medical History:  Diagnosis Date   Abnormal Pap smear    ascus with High Risk for HPV   De Quervain's tenosynovitis, left    Difficulty reading    Gestational diabetes    GDM with last pregnancy (2008)   History of gestational diabetes    History of hepatitis B 2007   History of malaria 2003   History of thrombocytopenia    Obese     Patient Active Problem List   Diagnosis Date Noted   Diabetes mellitus type 2 in obese ([place]) 03/24/2015   Metatarsal stress fracture of left foot 11/10/2013   Allergic rhinitis 05/26/2012   Carpal tunnel syndrome of left wrist 04/15/2011   History of malaria 03/04/2011   Hepatitis B infection 03/01/2011   H/O: C-section 03/01/2011   History of thrombocytopenia 03/01/2011    Past Surgical History:  Procedure Laterality Date   CESAREAN SECTION  2008   last pregnacy   CESAREAN SECTION  08/21/2011   Procedure: CESAREAN SECTION;  Surgeon: Donnamae Jude, MD;  Location: [facility];  Service: Gynecology;  Laterality: N/A;     OB History    Gravida  5   Para  3   Term  3   Preterm  0   AB  2   Living  3     SAB  1   TAB      Ectopic  0   Multiple  0    Live Births  3           Family History  Problem Relation Age of Onset   Hypertension Mother    Stroke Mother     Social History   Tobacco Use   Smoking status: Never Smoker   Smokeless tobacco: Never Used  Vaping Use   Vaping Use: Never used  Substance Use Topics   Alcohol use: No   Drug use: No    Home Medications Prior to Admission medications   Medication Sig Start Date End Date Taking? Authorizing Provider  HYDROcodone-acetaminophen (NORCO/VICODIN) 5-325 MG tablet Take 2 tablets by mouth every 4 (four) hours as needed. 11/07/19   Volanda Napoleon, PA-C  meloxicam (MOBIC) 15 MG tablet Take 1 tablet (15 mg total) by mouth daily. 04/21/19   Edrick Kins, DPM  methocarbamol (ROBAXIN) 500 MG tablet Take 1 tablet (500 mg total) by mouth every 8 (eight) hours as needed for muscle spasms. 12/22/18   Petrucelli, Glynda Jaeger, PA-C  methylPREDNISolone (MEDROL DOSEPAK) 4 MG TBPK tablet 6 day dose pack - take as directed 04/21/19   Edrick Kins, DPM  naproxen (NAPROSYN) 500 MG tablet Take 1 tablet (500 mg  total) by mouth 2 (two) times daily. 12/22/18   Petrucelli, Samantha R, PA-C  fluticasone (FLONASE) 50 MCG/ACT nasal spray Place 2 sprays into both nostrils daily. Patient not taking: Reported on 12/22/2018 10/08/18 12/22/18  Lestine Box, PA-C    Allergies    Cephalexin and Chloroquine  Review of Systems   Review of Systems  Constitutional:       Per HPI, otherwise negative  HENT:       Per HPI, otherwise negative  Respiratory:       Per HPI, otherwise negative  Cardiovascular:       Per HPI, otherwise negative  Gastrointestinal: Negative for vomiting.  Endocrine:       Negative aside from HPI  Genitourinary:       Neg aside from HPI   Musculoskeletal:       Per HPI, otherwise negative  Skin: Negative.   Neurological: Positive for numbness. Negative for syncope.    Physical Exam Updated Vital Signs BP 125/90 (BP Location: Right Arm)    Pulse (!) 50     Temp 97.9 F (36.6 C) (Oral)    Resp 16    Ht 5\' 1"  (1.549 m)    Wt 90.7 kg    LMP 04/10/2020    SpO2 97%    BMI 37.79 kg/m   Physical Exam Vitals and nursing note reviewed.  Constitutional:      General: She is not in acute distress.    Appearance: She is well-developed.  HENT:     Head: Normocephalic and atraumatic.  Eyes:     Conjunctiva/sclera: Conjunctivae normal.  Cardiovascular:     Rate and Rhythm: Normal rate and regular rhythm.  Pulmonary:     Effort: Pulmonary effort is normal. No respiratory distress.     Breath sounds: Normal breath sounds. No stridor.  Abdominal:     General: There is no distension.  Skin:    General: Skin is warm and dry.  Neurological:     Mental Status: She is alert and oriented to person, place, and time.     Cranial Nerves: Cranial nerves are intact. No cranial nerve deficit, dysarthria or facial asymmetry.     Motor: No weakness, tremor, atrophy or abnormal muscle tone.     Coordination: Coordination normal.     ED Results / Procedures / Treatments   Labs (all labs ordered are listed, but only abnormal results are displayed) Labs Reviewed  BASIC METABOLIC PANEL - Abnormal; Notable for the following components:      Result Value   Glucose, Bld 108 (*)    Calcium 8.8 (*)    All other components within normal limits  CBC - Abnormal; Notable for the following components:   WBC 2.6 (*)    Platelets 104 (*)    All other components within normal limits  MAGNESIUM    Radiology CT Head Wo Contrast  Result Date: 04/10/2020 CLINICAL DATA:  Intermittent left arm numbness.  Headache. EXAM: CT HEAD WITHOUT CONTRAST TECHNIQUE: Contiguous axial images were obtained from the base of the skull through the vertex without intravenous contrast. COMPARISON:  None. FINDINGS: Brain: No evidence of acute large vascular territory infarction, hemorrhage, hydrocephalus, extra-axial collection or mass lesion/mass effect. Vascular: No hyperdense vessel or  unexpected calcification. Skull: Normal. Negative for fracture or focal lesion. Sinuses/Orbits: The sinuses are clear. No acute orbital abnormality. Other: No mastoid effusions. IMPRESSION: No evidence of acute intracranial abnormality. Electronically Signed   By: Margaretha Sheffield MD  On: 04/10/2020 10:58    Procedures Procedures (including critical care time)  Medications Ordered in ED Medications  ketorolac (TORADOL) 30 MG/ML injection 15 mg (has no administration in time range)    ED Course  I have reviewed the triage vital signs and the nursing notes.  Pertinent labs & imaging results that were available during my care of the patient were reviewed by me and considered in my medical decision making (see chart for details).  Adult female presents with headache, left arm numbness. Though the patient's initial physical exam is generally reassuring, and the time course similarly so, with consideration of intracranial pathology, CNS disruption, the patient had CT scan performed, labs These were reassuring, no evidence for mass, hemorrhage, lower suspicion for stroke.  On there are some suspicion for this being an occupational related phenomenon as the patient does describe some improvement with rest. Given her reassuring findings, patient was discharged in stable condition with ongoing anti-inflammatories, instructions for reducing symptoms at work, consideration of alternative responsibilities.  Final Clinical Impression(s) / ED Diagnoses Final diagnoses:  Numbness  Bad headache    Rx / DC Orders ED Discharge Orders         Ordered    methylPREDNISolone (MEDROL DOSEPAK) 4 MG TBPK tablet        04/10/20 1157           Carmin Muskrat, MD 04/10/20 1159

## 2020-04-10 NOTE — Discharge Instructions (Signed)
As discussed, your evaluation today has been largely reassuring.  But, it is important that you monitor your condition carefully, and do not hesitate to return to the ED if you develop new, or concerning changes in your condition. ? ?Otherwise, please follow-up with your physician for appropriate ongoing care. ? ?

## 2020-04-10 NOTE — ED Triage Notes (Signed)
Patient has multiple complaints.  Patient c/o intermittent left arm numbness from shoulder to hand x 2 weeks, headache, and left heel pain.

## 2020-04-17 ENCOUNTER — Other Ambulatory Visit: Payer: Self-pay

## 2020-04-17 ENCOUNTER — Ambulatory Visit (INDEPENDENT_AMBULATORY_CARE_PROVIDER_SITE_OTHER): Payer: 59

## 2020-04-17 ENCOUNTER — Encounter (HOSPITAL_COMMUNITY): Payer: Self-pay | Admitting: Emergency Medicine

## 2020-04-17 ENCOUNTER — Ambulatory Visit (HOSPITAL_COMMUNITY)
Admission: EM | Admit: 2020-04-17 | Discharge: 2020-04-17 | Disposition: A | Discharge status: Home / Self Care | Payer: 59 | Attending: Internal Medicine | Admitting: Internal Medicine

## 2020-04-17 DIAGNOSIS — R079 Chest pain, unspecified: Secondary | ICD-10-CM | POA: Insufficient documentation

## 2020-04-17 DIAGNOSIS — R6883 Chills (without fever): Secondary | ICD-10-CM

## 2020-04-17 DIAGNOSIS — Z8249 Family history of ischemic heart disease and other diseases of the circulatory system: Secondary | ICD-10-CM | POA: Insufficient documentation

## 2020-04-17 DIAGNOSIS — R52 Pain, unspecified: Secondary | ICD-10-CM

## 2020-04-17 DIAGNOSIS — E119 Type 2 diabetes mellitus without complications: Secondary | ICD-10-CM | POA: Diagnosis not present

## 2020-04-17 DIAGNOSIS — Z20822 Contact with and (suspected) exposure to covid-19: Secondary | ICD-10-CM | POA: Insufficient documentation

## 2020-04-17 DIAGNOSIS — Z3202 Encounter for pregnancy test, result negative: Secondary | ICD-10-CM | POA: Diagnosis not present

## 2020-04-17 DIAGNOSIS — R109 Unspecified abdominal pain: Secondary | ICD-10-CM | POA: Diagnosis not present

## 2020-04-17 DIAGNOSIS — M7918 Myalgia, other site: Secondary | ICD-10-CM | POA: Diagnosis not present

## 2020-04-17 DIAGNOSIS — K5900 Constipation, unspecified: Secondary | ICD-10-CM | POA: Diagnosis not present

## 2020-04-17 DIAGNOSIS — Z7952 Long term (current) use of systemic steroids: Secondary | ICD-10-CM | POA: Insufficient documentation

## 2020-04-17 DIAGNOSIS — Z881 Allergy status to other antibiotic agents status: Secondary | ICD-10-CM | POA: Insufficient documentation

## 2020-04-17 DIAGNOSIS — K219 Gastro-esophageal reflux disease without esophagitis: Secondary | ICD-10-CM | POA: Diagnosis not present

## 2020-04-17 LAB — POCT URINALYSIS DIPSTICK, ED / UC
Bilirubin Urine: NEGATIVE
Glucose, UA: NEGATIVE mg/dL
Hgb urine dipstick: NEGATIVE
Ketones, ur: NEGATIVE mg/dL
Leukocytes,Ua: NEGATIVE
Nitrite: NEGATIVE
Protein, ur: NEGATIVE mg/dL
Specific Gravity, Urine: >=1.03 (ref 1.005–1.030)
Urobilinogen, UA: 0.2 mg/dL (ref 0.0–1.0)
pH: 6 (ref 5.0–8.0)

## 2020-04-17 LAB — POC URINE PREG, ED: Preg Test, Ur: NEGATIVE

## 2020-04-17 MED ORDER — LIDOCAINE VISCOUS HCL 2 % MT SOLN
15.0000 mL | Freq: Once | OROMUCOSAL | Status: AC
  Administered 2020-04-17: 15 mL via ORAL

## 2020-04-17 MED ORDER — FAMOTIDINE 20 MG PO TABS: 20.0000 mg | ORAL_TABLET | Freq: Two times a day (BID) | ORAL | 0 refills | Status: DC

## 2020-04-17 MED ORDER — ALUM & MAG HYDROXIDE-SIMETH 200-200-20 MG/5ML PO SUSP
ORAL | Status: AC
  Filled 2020-04-17: qty 30

## 2020-04-17 MED ORDER — ALUM & MAG HYDROXIDE-SIMETH 200-200-20 MG/5ML PO SUSP
30.0000 mL | Freq: Once | ORAL | Status: AC
  Administered 2020-04-17: 30 mL via ORAL

## 2020-04-17 MED ORDER — LIDOCAINE VISCOUS HCL 2 % MT SOLN
OROMUCOSAL | Status: AC
  Filled 2020-04-17: qty 15

## 2020-04-17 MED ORDER — POLYETHYLENE GLYCOL 3350 17 G PO PACK: 17.0000 g | PACK | Freq: Every day | ORAL | 0 refills | Status: DC

## 2020-04-17 NOTE — Discharge Instructions (Addendum)
You x ray showed a moderate amount of stool. I believe this is causing some of your symptoms. Miralax daily until good BM. Pepcid 2 times a day as needed.  Drink plenty of water.  You covid swab [place] pending  You can check my chart for results.

## 2020-04-17 NOTE — ED Triage Notes (Signed)
Pt presents with body aches, chills, and chest pain xs 1 week. States was given pain medication at [facility] ED on 04/10/20 but feels like the pain is worse since taking the medication.   States started with constipation, abdominal pain, and feeling like food was stuck in her chest.

## 2020-04-17 NOTE — ED Provider Notes (Signed)
[facility]    CSN: [id number] Arrival date & time: 04/17/20  0803      History   Chief Complaint Chief Complaint  Patient presents with  . Chest Pain  . Generalized Body Aches  . Chills    HPI Brenda Cannon is a 39 y.o. female.   Pt is a 39 year old female that presents with multiple complaints she has been weak, fatigue, dizzy, chest pain, headaches. She has also been constipated. Has been taking stool softener and reports soft stools. Previously constipated. No vomiting or diarrhea.  Chest pain is worse after she eats and can feel it in the mid sternal area.  Describes as something stuck in chest or burning at times.  No cough or fevers.  Symptoms it hurts when she takes a deep breath.  Was seen in the ER approximately 1 week ago for headache, left arm numbness.  CT scan was unremarkable.  Blood work showed thrombocytopenia at that time which this is chronic for her.     Past Medical History:  Diagnosis Date  . Abnormal Pap smear    ascus with High Risk for HPV  . De Quervain's tenosynovitis, left   . Difficulty reading   . Gestational diabetes    GDM with last pregnancy (2008)  . History of gestational diabetes   . History of hepatitis B 2007  . History of malaria 2003  . History of thrombocytopenia   . Obese     Patient Active Problem List   Diagnosis Date Noted  . Diabetes mellitus type 2 in obese ([place]) 03/24/2015  . Metatarsal stress fracture of left foot 11/10/2013  . Allergic rhinitis 05/26/2012  . 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 History of malaria 03/04/2011  . Hepatitis B infection 03/01/2011  . H/O: C-section 03/01/2011  . History of thrombocytopenia 03/01/2011    Past Surgical History:  Procedure Laterality Date  . CESAREAN SECTION  2008   last pregnacy  . CESAREAN SECTION  08/21/2011   Procedure: CESAREAN SECTION;  Surgeon: Donnamae Jude, MD;  Location: [facility];  Service: Gynecology;  Laterality: N/A;    OB  History    Gravida  5   Para  3   Term  3   Preterm  0   AB  2   Living  3     SAB  1   TAB      Ectopic  0   Multiple  0   Live Births  3            Home Medications    Prior to Admission medications   Medication Sig Start Date End Date Taking? Authorizing Provider  famotidine (PEPCID) 20 MG tablet Take 1 tablet (20 mg total) by mouth 2 (two) times daily. 04/17/20   Loura Halt A, NP  methylPREDNISolone (MEDROL DOSEPAK) 4 MG TBPK tablet 6 day dose pack - take as directed 04/10/20   Carmin Muskrat, MD  polyethylene glycol (MIRALAX / GLYCOLAX) 17 g packet Take 17 g by mouth daily. 04/17/20   Osa Fogarty, Tressia Miners A, NP  fluticasone (FLONASE) 50 MCG/ACT nasal spray Place 2 sprays into both nostrils daily. Patient not taking: Reported on 12/22/2018 10/08/18 12/22/18  Lestine Box, PA-C    Family History Family History  Problem Relation Age of Onset  . Hypertension Mother   . Stroke Mother     Social History Social History   Tobacco Use  . Smoking status: Never Smoker  .  Smokeless tobacco: Never Used  Vaping Use  . Vaping Use: Never used  Substance Use Topics  . Alcohol use: No  . Drug use: No     Allergies   Cephalexin and Chloroquine   Review of Systems Review of Systems   Physical Exam Triage Vital Signs ED Triage Vitals  Enc Vitals Group     BP 04/17/20 0822 (!) 117/53     Pulse Rate 04/17/20 0822 61     Resp 04/17/20 0822 16     Temp 04/17/20 0822 98.4 F (36.9 C)     Temp Source 04/17/20 0822 Oral     SpO2 04/17/20 0822 100 %     Weight --      Height --      Head Circumference --      Peak Flow --      Pain Score 04/17/20 0819 6     Pain Loc --      Pain Edu? --      Excl. in [place]? --    No data found.  Updated Vital Signs BP (!) 117/53 (BP Location: Left Arm)   Pulse 61   Temp 98.4 F (36.9 C) (Oral)   Resp 16   LMP 04/10/2020   SpO2 100%   Visual Acuity Right Eye Distance:   Left Eye Distance:   Bilateral Distance:     Right Eye Near:   Left Eye Near:    Bilateral Near:     Physical Exam Vitals and nursing note reviewed.  Constitutional:      General: She is not in acute distress.    Appearance: Normal appearance. She is not ill-appearing, toxic-appearing or diaphoretic.  HENT:     Head: Normocephalic.     Nose: Nose normal.  Eyes:     Conjunctiva/sclera: Conjunctivae normal.  Cardiovascular:     Rate and Rhythm: Normal rate and regular rhythm.  Pulmonary:     Effort: Pulmonary effort is normal.     Breath sounds: Normal breath sounds.  Abdominal:     Palpations: Abdomen is soft.  Musculoskeletal:        General: Normal range of motion.     Cervical back: Normal range of motion.  Skin:    General: Skin is warm and dry.     Findings: No rash.  Neurological:     Mental Status: She is alert.  Psychiatric:        Mood and Affect: Mood normal.      UC Treatments / Results  Labs (all labs ordered are listed, but only abnormal results are displayed) Labs Reviewed  SARS CORONAVIRUS 2 (TAT 6-24 HRS)  POCT URINALYSIS DIPSTICK, ED / UC  POC URINE PREG, ED    EKG   Radiology DG Abd Acute W/Chest  Result Date: 04/17/2020 CLINICAL DATA:  Chest and abdominal pain for 1 week with myalgias and chills. EXAM: DG ABDOMEN ACUTE W/ 1V CHEST COMPARISON:  07/22/2016 chest radiograph. 04/19/2019 abdominal radiograph. FINDINGS: Stable cardiomediastinal silhouette with top-normal heart size. No pneumothorax. No pleural effusion. Lungs appear clear, with no acute consolidative airspace disease and no pulmonary edema. No dilated small bowel loops. Mild-to-moderate colonic stool volume. No evidence of pneumatosis or pneumoperitoneum. Possible tiny 2 mm stone overlying the mid right kidney. IMPRESSION: 1. No active cardiopulmonary disease. 2. Nonobstructive bowel gas pattern. 3. Mild-to-moderate colonic stool volume. 4. Possible tiny 2 mm stone overlying the mid right kidney. Electronically Signed   By:  Janina Mayo.D.  On: 04/17/2020 09:13    Procedures Procedures (including critical care time)  Medications Ordered in UC Medications  alum & mag hydroxide-simeth (MAALOX/MYLANTA) 200-200-20 MG/5ML suspension 30 mL (30 mLs Oral Given 04/17/20 0905)    And  lidocaine (XYLOCAINE) 2 % viscous mouth solution 15 mL (15 mLs Oral Given 04/17/20 0905)    Initial Impression / Assessment and Plan / UC Course  I have reviewed the triage vital signs and the nursing notes.  Pertinent labs & imaging results that were available during my care of the patient were reviewed by me and considered in my medical decision making (see chart for details).     Constipation X-ray with mild to moderate colonic stool volume Treating with MiraLAX. Recommend increase water intake.  GERD Pepcid twice a day as needed. GI cocktail given here with relief.  Chills Covid swab pending.  Urine without infection or pregnancy. Follow up as needed for continued or worsening symptoms  Final Clinical Impressions(s) / UC Diagnoses   Final diagnoses:  Constipation, unspecified constipation type  Gastroesophageal reflux disease without esophagitis  Chills     Discharge Instructions     You x ray showed a moderate amount of stool. I believe this is causing some of your symptoms. Miralax daily until good BM. Pepcid 2 times a day as needed.  Drink plenty of water.  You covid swab [place] pending  You can check my chart for results.     ED Prescriptions    Medication Sig Dispense Auth. Provider   polyethylene glycol (MIRALAX / GLYCOLAX) 17 g packet Take 17 g by mouth daily. 14 each Bryn Perkin A, NP   famotidine (PEPCID) 20 MG tablet Take 1 tablet (20 mg total) by mouth 2 (two) times daily. 30 tablet Loura Halt A, NP     PDMP not reviewed this encounter.   Orvan July, NP 04/17/20 [phone]

## 2020-04-18 LAB — SARS CORONAVIRUS 2 (TAT 6-24 HRS): SARS Coronavirus 2: NEGATIVE

## 2020-04-29 ENCOUNTER — Emergency Department (HOSPITAL_COMMUNITY)
Admission: EM | Admit: 2020-04-29 | Discharge: 2020-04-29 | Disposition: A | Discharge status: Home / Self Care | Payer: 59 | Attending: Emergency Medicine | Admitting: Emergency Medicine

## 2020-04-29 DIAGNOSIS — H538 Other visual disturbances: Secondary | ICD-10-CM | POA: Insufficient documentation

## 2020-04-29 DIAGNOSIS — R400 Somnolence: Secondary | ICD-10-CM | POA: Insufficient documentation

## 2020-04-29 DIAGNOSIS — R42 Dizziness and giddiness: Secondary | ICD-10-CM | POA: Insufficient documentation

## 2020-04-29 DIAGNOSIS — R5383 Other fatigue: Secondary | ICD-10-CM | POA: Diagnosis not present

## 2020-04-29 DIAGNOSIS — R531 Weakness: Secondary | ICD-10-CM | POA: Insufficient documentation

## 2020-04-29 LAB — COMPREHENSIVE METABOLIC PANEL
ALT: 64 U/L — ABNORMAL HIGH (ref 0–44)
AST: 49 U/L — ABNORMAL HIGH (ref 15–41)
Albumin: 3.6 g/dL (ref 3.5–5.0)
Alkaline Phosphatase: 50 U/L (ref 38–126)
Anion gap: 9 (ref 5–15)
BUN: 14 mg/dL (ref 6–20)
CO2: 22 mmol/L (ref 22–32)
Calcium: 8.8 mg/dL — ABNORMAL LOW (ref 8.9–10.3)
Chloride: 105 mmol/L (ref 98–111)
Creatinine, Ser: 0.74 mg/dL (ref 0.44–1.00)
GFR, Estimated: >60 mL/min (ref >60)
Glucose, Bld: 222 mg/dL — ABNORMAL HIGH (ref 70–99)
Potassium: 4 mmol/L (ref 3.5–5.1)
Sodium: 136 mmol/L (ref 135–145)
Total Bilirubin: 0.8 mg/dL (ref 0.3–1.2)
Total Protein: 6.7 g/dL (ref 6.5–8.1)

## 2020-04-29 LAB — URINALYSIS, ROUTINE W REFLEX MICROSCOPIC
Bilirubin Urine: NEGATIVE
Glucose, UA: 150 mg/dL — AB
Hgb urine dipstick: NEGATIVE
Ketones, ur: NEGATIVE mg/dL
Leukocytes,Ua: NEGATIVE
Nitrite: NEGATIVE
Protein, ur: NEGATIVE mg/dL
Specific Gravity, Urine: 1.027 (ref 1.005–1.030)
pH: 5 (ref 5.0–8.0)

## 2020-04-29 LAB — CBC
HCT: 41.4 % (ref 36.0–46.0)
Hemoglobin: 13.1 g/dL (ref 12.0–15.0)
MCH: 28.9 pg (ref 26.0–34.0)
MCHC: 31.6 g/dL (ref 30.0–36.0)
MCV: 91.2 fL (ref 80.0–100.0)
Platelets: 121 10*3/uL — ABNORMAL LOW (ref 150–400)
RBC: 4.54 MIL/uL (ref 3.87–5.11)
RDW: 13.1 % (ref 11.5–15.5)
WBC: 3.3 10*3/uL — ABNORMAL LOW (ref 4.0–10.5)
nRBC: 0 % (ref 0.0–0.2)

## 2020-04-29 LAB — TSH: TSH: 1.102 u[IU]/mL (ref 0.350–4.500)

## 2020-04-29 LAB — LIPASE, BLOOD: Lipase: 55 U/L — ABNORMAL HIGH (ref 11–51)

## 2020-04-29 NOTE — ED Provider Notes (Signed)
[place] EMERGENCY DEPARTMENT Provider Note   CSN: [id number] Arrival date & time: 04/29/20  1517     History Chief Complaint  Patient presents with  . multiple complaints    Brenda Cannon is a 39 y.o. female.  HPI 39 year old female with a history of thrombocytopenia, hepatitis B, malaria, presents to the ER with complaints of fatigue, intermittent blurry vision and dizziness, weakness, sleepiness and constipation x2 weeks.  Per chart review patient has been evaluated at urgent care and [facility] in the last two weeks.  CT of the head was without any abnormalities, lab work with baseline thrombocytopenia but no other abnormalities.  She was told to take MiraLAX by urgent care.  She states that she has been taking this, but cannot tell me how many prescription she is taking.  She states that she ran out the [place] and has not bought more and has not taken it in the last several days.  She states she had a small hard bowel movement this morning.  Eating and drinking well.  No nausea or vomiting.  She states that she just overall is feeling very tired and wants something to help her feel more energetic.  She denies any cough, fevers, chills.  She was tested for Covid week and a half ago which was negative.  She has a pending PCP follow-up on 11/25.  Denies any headache, syncope, head injury, chest pain, shortness of breath.  Past Medical History:  Diagnosis Date  . Abnormal Pap smear    ascus with High Risk for HPV  . De Quervain's tenosynovitis, left   . Difficulty reading   . Gestational diabetes    GDM with last pregnancy (2008)  . History of gestational diabetes   . History of hepatitis B 2007  . History of malaria 2003  . History of thrombocytopenia   . Obese     Patient Active Problem List   Diagnosis Date Noted  . Diabetes mellitus type 2 in obese ([place]) 03/24/2015  . Metatarsal stress fracture of left foot 11/10/2013  . Allergic rhinitis  05/26/2012  . 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 History of malaria 03/04/2011  . Hepatitis B infection 03/01/2011  . H/O: C-section 03/01/2011  . History of thrombocytopenia 03/01/2011    Past Surgical History:  Procedure Laterality Date  . CESAREAN SECTION  2008   last pregnacy  . CESAREAN SECTION  08/21/2011   Procedure: CESAREAN SECTION;  Surgeon: Donnamae Jude, MD;  Location: [facility];  Service: Gynecology;  Laterality: N/A;     OB History    Gravida  5   Para  3   Term  3   Preterm  0   AB  2   Living  3     SAB  1   TAB      Ectopic  0   Multiple  0   Live Births  3           Family History  Problem Relation Age of Onset  . Hypertension Mother   . Stroke Mother     Social History   Tobacco Use  . Smoking status: Never Smoker  . Smokeless tobacco: Never Used  Vaping Use  . Vaping Use: Never used  Substance Use Topics  . Alcohol use: No  . Drug use: No    Home Medications Prior to Admission medications   Medication Sig Start Date End Date Taking? Authorizing Provider  famotidine (PEPCID) 20 MG tablet Take 1 tablet (20 mg total) by mouth 2 (two) times daily. 04/17/20   Loura Halt A, NP  methylPREDNISolone (MEDROL DOSEPAK) 4 MG TBPK tablet 6 day dose pack - take as directed 04/10/20   Carmin Muskrat, MD  polyethylene glycol (MIRALAX / GLYCOLAX) 17 g packet Take 17 g by mouth daily. 04/17/20   Bast, Tressia Miners A, NP  fluticasone (FLONASE) 50 MCG/ACT nasal spray Place 2 sprays into both nostrils daily. Patient not taking: Reported on 12/22/2018 10/08/18 12/22/18  Lestine Box, PA-C    Allergies    Cephalexin and Chloroquine  Review of Systems   Review of Systems  Constitutional: Positive for fatigue. Negative for chills and fever.  HENT: Negative for ear pain and sore throat.   Eyes: Positive for visual disturbance. Negative for pain.  Respiratory: Negative for cough and shortness of breath.   Cardiovascular: Negative for  chest pain and palpitations.  Gastrointestinal: Positive for constipation. Negative for abdominal pain and vomiting.  Genitourinary: Negative for dysuria and hematuria.  Musculoskeletal: Negative for arthralgias and back pain.  Skin: Negative for color change and rash.  Neurological: Positive for dizziness and weakness. Negative for seizures, syncope and headaches.  All other systems reviewed and are negative.   Physical Exam Updated Vital Signs BP 122/83 (BP Location: Left Arm)   Pulse 73   Temp 98.2 F (36.8 C) (Oral)   Resp 17   LMP 04/10/2020   SpO2 100%   Physical Exam Constitutional:      General: She is not in acute distress.    Appearance: Normal appearance. She is obese. She is not ill-appearing, toxic-appearing or diaphoretic.  HENT:     Head: Normocephalic and atraumatic.     Mouth/Throat:     Mouth: Mucous membranes are moist.     Pharynx: Oropharynx is clear.  Eyes:     Extraocular Movements: Extraocular movements intact.     Conjunctiva/sclera: Conjunctivae normal.     Pupils: Pupils are equal, round, and reactive to light.  Cardiovascular:     Rate and Rhythm: Normal rate and regular rhythm.     Pulses: Normal pulses.     Heart sounds: Normal heart sounds.  Pulmonary:     Effort: Pulmonary effort is normal.     Breath sounds: Normal breath sounds. No wheezing.  Abdominal:     General: Abdomen is flat. Bowel sounds are normal. There is no distension.     Palpations: Abdomen is soft. There is no mass.     Tenderness: There is no abdominal tenderness. There is no right CVA tenderness, left CVA tenderness, guarding or rebound.  Musculoskeletal:        General: No tenderness. Normal range of motion.     Cervical back: Normal range of motion. No tenderness.     Right lower leg: No edema.     Left lower leg: No edema.  Skin:    General: Skin is warm and dry.  Neurological:     General: No focal deficit present.     Mental Status: She is alert and oriented  to person, place, and time.     Cranial Nerves: No cranial nerve deficit.     Sensory: No sensory deficit.     Motor: No weakness.  Psychiatric:        Mood and Affect: Mood normal.        Behavior: Behavior normal.     ED Results / Procedures / Treatments   Labs (  all labs ordered are listed, but only abnormal results are displayed) Labs Reviewed  LIPASE, BLOOD - Abnormal; Notable for the following components:      Result Value   Lipase 55 (*)    All other components within normal limits  COMPREHENSIVE METABOLIC PANEL - Abnormal; Notable for the following components:   Glucose, Bld 222 (*)    Calcium 8.8 (*)    AST 49 (*)    ALT 64 (*)    All other components within normal limits  CBC - Abnormal; Notable for the following components:   WBC 3.3 (*)    Platelets 121 (*)    All other components within normal limits  URINALYSIS, ROUTINE W REFLEX MICROSCOPIC - Abnormal; Notable for the following components:   APPearance HAZY (*)    Glucose, UA 150 (*)    All other components within normal limits  TSH  HEMOGLOBIN A1C  I-STAT BETA HCG BLOOD, ED ([facility], [facility], [facility] ONLY)    EKG None  Radiology No results found.  Procedures Procedures (including critical care time)  Medications Ordered in ED Medications - No data to display  ED Course  I have reviewed the triage vital signs and the nursing notes.  Pertinent labs & imaging results that were available during my care of the patient were reviewed by me and considered in my medical decision making (see chart for details).    MDM Rules/Calculators/A&P                         39 year old female with complaints of 2 weeks of fatigue, intermittent blurry vision, constipation On presentation, she is alert, oriented, nontoxic-appearing, no acute distress.  No visible focal neuro deficits on exam, abdomen is soft and nontender.  Vitals overall reassuring.  I reviewed her chart which showed a CT scan on 10/5, and lab work.  Her lab work  today shows a glucose of 222, mild hypocalcemia of 8.8, transaminitis of 49 and 64 respectively, per previous lab work review of her LFTs were elevated proximally 11 months ago, these values have actually been improved.  Lipase of 55, elevated for an unknown reason.  She has a WBC count of 3.3 and a known thrombocytopenia of 121 which actually peers improved.  Her UA shows glucose of 150, no evidence of UTI.  Low concern for intracranial abnormality, stroke, sepsis.  She could potentially be showing early signs of diabetes given elevated glucose and glucosuria.  She was previously diagnosed with gestational diabetes.  We will add on hemoglobin A1c and TSH.  However there are no signs of a surgical abdomen, sepsis, stroke, Guillain-Barr, myesthenia gravis, pneumonia, COVID etc. I do not think she needs any additional CT imaging of her head or abdomen at this time.  She denies any cough, fevers, chills, tested for Covid approximately week and half ago which was negative.  Encouraged the patient to keep her PCP follow-up in a month.  Instructed to continue to take MiraLAX, directed her to magnesium citrate in case this does not work.  Return precautions discussed.  She voiced understanding and is agreeable.  At this stage in the ED course, the patient is medically screened and stable for discharge  Case discussed with Dr. Melina Copa who is agreeable to the above plan and disposition.   Final Clinical Impression(s) / ED Diagnoses Final diagnoses:  Fatigue, unspecified type    Rx / DC Orders ED Discharge Orders    None  Garald Balding, PA-C 04/29/20 1736    Hayden Rasmussen, MD 04/30/20 1121

## 2020-04-29 NOTE — Discharge Instructions (Addendum)
Your work-up today showed possible signs of early diabetes.  Please make sure to keep your primary care doctor appointment.  You may try to call their office and see if you can get an earlier appointment. Continue to take MiraLAX, and you may buy magnesium citrate which can be found at your local pharmacy in case the MiraLAX does not work.  Return to the ER for any new or worsening symptoms.

## 2020-04-29 NOTE — ED Triage Notes (Signed)
Pt here with fatigue, double vision and constipation x2 weeks. Pt has been assessed at urgent care and [facility]. Pt has had both covid vaccines last shot was sept first. Pt had a neg test two weeks ago. Pt states that she has been eating and drinking and taking miralax as prescribed. Last BM was today.

## 2020-05-01 LAB — HEMOGLOBIN A1C
Hgb A1c MFr Bld: 7.2 % — ABNORMAL HIGH (ref 4.8–5.6)
Mean Plasma Glucose: 160 mg/dL

## 2020-05-29 ENCOUNTER — Ambulatory Visit (HOSPITAL_COMMUNITY)
Admission: EM | Admit: 2020-05-29 | Discharge: 2020-05-29 | Disposition: A | Discharge status: Home / Self Care | Payer: 59 | Attending: Urgent Care | Admitting: Urgent Care

## 2020-05-29 ENCOUNTER — Encounter (HOSPITAL_COMMUNITY): Payer: Self-pay | Admitting: *Deleted

## 2020-05-29 ENCOUNTER — Other Ambulatory Visit: Payer: Self-pay

## 2020-05-29 DIAGNOSIS — G5603 Carpal tunnel syndrome, bilateral upper limbs: Secondary | ICD-10-CM

## 2020-05-29 DIAGNOSIS — R200 Anesthesia of skin: Secondary | ICD-10-CM

## 2020-05-29 DIAGNOSIS — M79641 Pain in right hand: Secondary | ICD-10-CM

## 2020-05-29 DIAGNOSIS — M79642 Pain in left hand: Secondary | ICD-10-CM

## 2020-05-29 MED ORDER — TIZANIDINE HCL 4 MG PO TABS: 4.0000 mg | ORAL_TABLET | Freq: Every day | ORAL | 0 refills | Status: DC

## 2020-05-29 MED ORDER — PREDNISONE 20 MG PO TABS: ORAL_TABLET | ORAL | 0 refills | Status: DC

## 2020-05-29 NOTE — ED Provider Notes (Signed)
[facility]   MRN: [id number] DOB: December 07, 1980  Subjective:   Brenda Cannon is a 39 y.o. female presenting for 42-month history of persistent bilateral wrist pain with numbness of her fingers.  Patient was first seen for this about 2 months ago at the emergency room and was prescribed a Medrol dose pack.  Patient states that she started it and did get some relief.  Ultimately she had to stop it because she felt like it was making her sick.  Denies any falls, trauma.  Patient does a lot of work with her hands and her wrists.  Works at a [employer].  She has previously told her supervisor that she is having a difficult time but has not had her work position, work tasks changed.  Last A1c on 04/29/2020 was 7.2%.  No current facility-administered medications for this encounter.  Current Outpatient Medications:    famotidine (PEPCID) 20 MG tablet, Take 1 tablet (20 mg total) by mouth 2 (two) times daily., Disp: 30 tablet, Rfl: 0   methylPREDNISolone (MEDROL DOSEPAK) 4 MG TBPK tablet, 6 day dose pack - take as directed, Disp: 21 tablet, Rfl: 0   polyethylene glycol (MIRALAX / GLYCOLAX) 17 g packet, Take 17 g by mouth daily., Disp: 14 each, Rfl: 0   Allergies  Allergen Reactions   Cephalexin Itching and Rash   Chloroquine Hives    Past Medical History:  Diagnosis Date   Abnormal Pap smear    ascus with High Risk for HPV   De Quervain's tenosynovitis, left    Difficulty reading    Gestational diabetes    GDM with last pregnancy (2008)   History of gestational diabetes    History of hepatitis B 2007   History of malaria 2003   History of thrombocytopenia    Obese      Past Surgical History:  Procedure Laterality Date   CESAREAN SECTION  2008   last pregnacy   CESAREAN SECTION  08/21/2011   Procedure: CESAREAN SECTION;  Surgeon: Donnamae Jude, MD;  Location: [facility];  Service: Gynecology;  Laterality: N/A;     Family History  Problem Relation Age of Onset   Hypertension Mother    Stroke Mother     Social History   Tobacco Use   Smoking status: Never Smoker   Smokeless tobacco: Never Used  Vaping Use   Vaping Use: Never used  Substance Use Topics   Alcohol use: No   Drug use: No    ROS   Objective:   Vitals: BP 120/78 (BP Location: Right Arm)    Pulse (!) 59    Temp 98.2 F (36.8 C) (Oral)    Resp 18    Wt 200 lb (90.7 kg)    LMP 05/08/2020    SpO2 97%    BMI 37.79 kg/m   Physical Exam Constitutional:      General: She is not in acute distress.    Appearance: Normal appearance. She is well-developed. She is not ill-appearing, toxic-appearing or diaphoretic.  HENT:     Head: Normocephalic and atraumatic.     Nose: Nose normal.     Mouth/Throat:     Mouth: Mucous membranes are moist.     Pharynx: Oropharynx is clear.  Eyes:     General: No scleral icterus.       Right eye: No discharge.        Left eye: No discharge.  Extraocular Movements: Extraocular movements intact.     Conjunctiva/sclera: Conjunctivae normal.     Pupils: Pupils are equal, round, and reactive to light.  Cardiovascular:     Rate and Rhythm: Normal rate.  Pulmonary:     Effort: Pulmonary effort is normal.  Musculoskeletal:     Right wrist: No swelling, deformity, effusion, lacerations, tenderness, bony tenderness, snuff box tenderness or crepitus. Normal range of motion.     Left wrist: No swelling, deformity, effusion, lacerations, tenderness, bony tenderness, snuff box tenderness or crepitus. Normal range of motion.     Right hand: Tenderness present. No swelling, deformity, lacerations or bony tenderness. Decreased range of motion. Normal strength. Normal sensation. Normal capillary refill.     Left hand: Tenderness present. No swelling, deformity, lacerations or bony tenderness. Decreased range of motion. Normal strength. Normal sensation. Normal capillary refill.     Comments:  Positive Phalen test for both wrists.  Patient has movement pain and cannot fully flex at the MCP.  Skin:    General: Skin is warm and dry.  Neurological:     General: No focal deficit present.     Mental Status: She is alert and oriented to person, place, and time.     Motor: No weakness.     Coordination: Coordination normal.     Gait: Gait normal.     Deep Tendon Reflexes: Reflexes normal.  Psychiatric:        Mood and Affect: Mood normal.        Behavior: Behavior normal.        Thought Content: Thought content normal.        Judgment: Judgment normal.       Assessment and Plan :   PDMP not reviewed this encounter.  1. Bilateral carpal tunnel syndrome   2. Bilateral hand pain   3. Bilateral hand numbness     Recommended oral prednisone course for bilateral carpal tunnel syndrome.  Emphasized need to consider different line of work where she does not have to use her wrists and hands excessively.  Patient states she will consider this.  Follow-up with [facility] for further intervention.  Recommended avoiding frequent use of steroids.  Patient has well-controlled diabetic and is stable to use this course, stable for outpatient management. Counseled patient on potential for adverse effects with medications prescribed/recommended today, ER and return-to-clinic precautions discussed, patient verbalized understanding.    Jaynee Eagles, [place] 05/29/20 [phone]

## 2020-05-29 NOTE — ED Triage Notes (Signed)
PT reports bil hand pain for several months with numbness to fingers. Pt makes a fist with both hands with out difficulty.

## 2020-05-31 ENCOUNTER — Other Ambulatory Visit: Payer: Self-pay

## 2020-05-31 ENCOUNTER — Ambulatory Visit (INDEPENDENT_AMBULATORY_CARE_PROVIDER_SITE_OTHER): Payer: 59 | Admitting: Family Medicine

## 2020-05-31 VITALS — BP 121/68 | Ht 61.0 in | Wt 200.0 lb

## 2020-05-31 DIAGNOSIS — G5603 Carpal tunnel syndrome, bilateral upper limbs: Secondary | ICD-10-CM | POA: Diagnosis not present

## 2020-05-31 NOTE — Progress Notes (Addendum)
SUBJECTIVE:   CHIEF COMPLAINT / HPI:   Bilateral wrist pain: Patient is a pleasant 39 year old female that presents today for follow-up after urgent care visit for bilateral wrist pain where she was diagnosed with bilateral carpal tunnel.  Patient states that for the past 3 to 4 months she has noticed some numbness and tingling in her fingers particularly in her middle finger of both hands.  She states that she has noticed some burning sensation and pain in the first 3 digits of her bilateral hands as well.  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  She states that she also sleeps at night with her hand underneath her head with her wrist flexed and that her pain seems to be most prominent overnight.  She states that she visited urgent care and they gave her a prednisone Dosepak which has improved her symptoms but when she requested refill/so this would not be an ideal medication for her long-term due to her history of diabetes.  She states she has not tried any rehabilitative exercises or wrist splints.  She denies weakness.  PERTINENT  PMH / PSH: History of diabetes  OBJECTIVE:   BP 121/68    Ht 5\' 1"  (1.549 m)    Wt 200 lb (90.7 kg)    LMP 05/08/2020    BMI 37.79 kg/m    MSK: Strength 5/5 in elbow flexion and extension bilaterally, strength 5/5 in grip and in finger separation bilaterally.  Patient does have positive Phalen's and positive Tinel's sign bilaterally. Neuro: Fine touch sensation intact in upper extremities bilaterally.  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  There is some evidence in long axis of the compression of the nerve.  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  There is also evidence of some  compression of the nerve in the area of the retinaculum viewed in long axis. Impression: Ultrasound evidence of bilateral carpal tunnel  ASSESSMENT/PLAN:   Bilateral carpal tunnel: 39 year old female with several months of worsening numbness, tingling, and burning sensation in the first 3 digits of her bilateral hands.  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  Her symptoms are worse at night she states that she does sleep lying against her arm with her wrist flexed which would put stress on the carpal tunnel.  She did get some benefit from prednisone Dosepak, which she has 2 days remaining, but this would not be an ideal long-term treatment due to her history of type 2 diabetes.  Patient does have ultrasound evidence of bilateral carpal tunnel with persistent median artery and bifid median nerve present in bilateral extremities. Plan: -We will provide patient with your splints to wear every night and when able during the day -We will recommend patient use warm water on her wrist for relief -Follow-up in 3 weeks  Lurline Del, [place]   This note was prepared using Dragon voice recognition software and may include unintentional dictation errors due to the inherent limitations of voice recognition software.  I observed and examined the patient with the resident and agree with  assessment and plan.  Note reviewed and modified by me. Ila Mcgill, MD

## 2020-05-31 NOTE — Patient Instructions (Signed)
It was great to see you today as much Brenda Cannon  Our plan for today: -We are providing some a wrist splints for you to wear.  I recommend that you wear these every night and wear them as much as you can throughout the day, you can remove these to shower and for other task as needed. -Also recommend that you use warm water and help stretch out your wrist with this periodically throughout the day -Try to minimize activities at work that aggravate your symptoms -I would like for you to follow back up in about 3 weeks for reevaluation

## 2020-06-28 ENCOUNTER — Ambulatory Visit (INDEPENDENT_AMBULATORY_CARE_PROVIDER_SITE_OTHER): Payer: 59 | Admitting: Family Medicine

## 2020-06-28 ENCOUNTER — Other Ambulatory Visit: Payer: Self-pay

## 2020-06-28 VITALS — BP 131/81 | Ht 61.0 in | Wt 200.0 lb

## 2020-06-28 DIAGNOSIS — G5603 Carpal tunnel syndrome, bilateral upper limbs: Secondary | ICD-10-CM | POA: Diagnosis not present

## 2020-06-28 NOTE — Progress Notes (Signed)
    SUBJECTIVE:   CHIEF COMPLAINT / HPI:   Bilateral Carpal Tunnel Patient states she is much better. She has not had to do the work activity of pouring ice over chickens at the [employer] and she attributes her lack of symptoms to this. She has used the wrist braces at night time but only 3-4 times. She states she has no symptoms any more and feels well. She is requesting a letter that states the job of pouring ice over chickens is contributing to her symptoms.  PERTINENT  PMH / PSH: Hx of thrombocytopenia  OBJECTIVE:   BP 131/81   Ht 5\' 1"  (1.549 m)   Wt 200 lb (90.7 kg)   BMI 37.79 kg/m   No flowsheet data found.  Wrist, Right: Inspection yielded no erythema, ecchymosis, bony deformity, or swelling. ROM full with good flexion and extension and ulnar/radial deviation. No TTP. Strength 5/5 in all directions without pain. Negative tinel's and phalens.  Wrist, Left: Inspection yielded no erythema, ecchymosis, bony deformity, or swelling. ROM full with good flexion and extension and ulnar/radial deviation. No TTP. Strength 5/5 in all directions without pain. Negative tinel's and phalens.  ASSESSMENT/PLAN:   Carpal tunnel syndrome, bilateral Patient endorsing resolution of her symptoms. 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 - I advised her to restart wearing braces if symptoms return and if that does not help to return to clinic. - I did give her a letter which states it is possible that repetitive activity such as pouring ice over chickens may exacerbate her symptoms and it would be best to avoid it if possible - F/u if needed     Nuala Alpha, DO PGY-4, Sports Medicine Fellow [place]

## 2020-06-28 NOTE — Patient Instructions (Signed)
It was great to see you today! Thank you for letting me participate in your care!  Today, we discussed your bilateral wrist pain which is due to carpal tunnel. I am glad it is better. If it does return please use the braces at night time. If this does not help please return to see [place].  Be well, Harolyn Rutherford, DO PGY-4, Sports Medicine Fellow [place]

## 2020-06-28 NOTE — Assessment & Plan Note (Signed)
Patient endorsing resolution of her symptoms. 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 - I advised her to restart wearing braces if symptoms return and if that does not help to return to clinic. - I did give her a letter which states it is possible that repetitive activity such as pouring ice over chickens may exacerbate her symptoms and it would be best to avoid it if possible - F/u if needed

## 2020-07-09 ENCOUNTER — Other Ambulatory Visit: Payer: Self-pay

## 2020-07-09 ENCOUNTER — Ambulatory Visit (HOSPITAL_COMMUNITY)
Admission: EM | Admit: 2020-07-09 | Discharge: 2020-07-09 | Disposition: A | Discharge status: Home / Self Care | Payer: 59 | Attending: Emergency Medicine | Admitting: Emergency Medicine

## 2020-07-09 ENCOUNTER — Encounter (HOSPITAL_COMMUNITY): Payer: Self-pay | Admitting: Emergency Medicine

## 2020-07-09 DIAGNOSIS — J069 Acute upper respiratory infection, unspecified: Secondary | ICD-10-CM | POA: Diagnosis not present

## 2020-07-09 DIAGNOSIS — U071 COVID-19: Secondary | ICD-10-CM | POA: Insufficient documentation

## 2020-07-09 DIAGNOSIS — H6503 Acute serous otitis media, bilateral: Secondary | ICD-10-CM

## 2020-07-09 MED ORDER — DOXYCYCLINE HYCLATE 100 MG PO CAPS: 100.0000 mg | ORAL_CAPSULE | Freq: Two times a day (BID) | ORAL | 0 refills | Status: DC

## 2020-07-09 NOTE — ED Triage Notes (Signed)
PT C/O: stuffy nose, headache, sore throat onset 8 days   Would like to be tested for COVID  DENIES: f/v/n/d  TAKING MEDS: OTC acetaminophen and Ibuprofen   A&O x4... NAD... Ambulatory

## 2020-07-09 NOTE — ED Provider Notes (Signed)
[place]    CSN: [id number] Arrival date & time: 07/09/20  1230      History   Chief Complaint Chief Complaint  Patient presents with  . URI    HPI Brenda Cannon is a 40 y.o. female.   HPI   40 year old female here for evaluation of nasal congestion, headache, and sore throat that she is had for last 8 days.  Patient reports that she would like to be tested for Covid.  Additionally patient has been complaining of bilateral ear pain and some occasional itching.  Patient denies fever, runny nose, cough, or GI complaints.  Patient has been vaccinated as Covid but not received her booster shot.  Patient is not had her flu shot.  Past Medical History:  Diagnosis Date  . Abnormal Pap smear    ascus with High Risk for HPV  . De Quervain's tenosynovitis, left   . Difficulty reading   . Gestational diabetes    GDM with last pregnancy (2008)  . History of gestational diabetes   . History of hepatitis B 2007  . History of malaria 2003  . History of thrombocytopenia   . Obese     Patient Active Problem List   Diagnosis Date Noted  . Carpal tunnel syndrome, bilateral 05/31/2020  . Diabetes mellitus type 2 in obese ([place]) 03/24/2015  . Metatarsal stress fracture of left foot 11/10/2013  . Allergic rhinitis 05/26/2012  . 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 History of malaria 03/04/2011  . Hepatitis B infection 03/01/2011  . H/O: C-section 03/01/2011  . History of thrombocytopenia 03/01/2011    Past Surgical History:  Procedure Laterality Date  . CESAREAN SECTION  2008   last pregnacy  . CESAREAN SECTION  08/21/2011   Procedure: CESAREAN SECTION;  Surgeon: Donnamae Jude, MD;  Location: [facility];  Service: Gynecology;  Laterality: N/A;    OB History    Gravida  5   Para  3   Term  3   Preterm  0   AB  2   Living  3     SAB  1   IAB      Ectopic  0   Multiple  0   Live Births  3            Home Medications    Prior  to Admission medications   Medication Sig Start Date End Date Taking? Authorizing Provider  doxycycline (VIBRAMYCIN) 100 MG capsule Take 1 capsule (100 mg total) by mouth 2 (two) times daily. 07/09/20  Yes Margarette Canada, NP  famotidine (PEPCID) 20 MG tablet Take 1 tablet (20 mg total) by mouth 2 (two) times daily. 04/17/20   Loura Halt A, NP  methylPREDNISolone (MEDROL DOSEPAK) 4 MG TBPK tablet 6 day dose pack - take as directed 04/10/20   Carmin Muskrat, MD  polyethylene glycol (MIRALAX / GLYCOLAX) 17 g packet Take 17 g by mouth daily. 04/17/20   Loura Halt A, NP  predniSONE (DELTASONE) 20 MG tablet Take 2 tablets daily with breakfast. 05/29/20   Jaynee Eagles, PA-C  tiZANidine (ZANAFLEX) 4 MG tablet Take 1 tablet (4 mg total) by mouth at bedtime. 05/29/20   Jaynee Eagles, PA-C  fluticasone (FLONASE) 50 MCG/ACT nasal spray Place 2 sprays into both nostrils daily. Patient not taking: Reported on 12/22/2018 10/08/18 12/22/18  Lestine Box, PA-C    Family History Family History  Problem Relation Age of Onset  . Hypertension Mother   .  Stroke Mother     Social History Social History   Tobacco Use  . Smoking status: Never Smoker  . Smokeless tobacco: Never Used  Vaping Use  . Vaping Use: Never used  Substance Use Topics  . Alcohol use: No  . Drug use: No     Allergies   Cephalexin and Chloroquine   Review of Systems Review of Systems  Constitutional: Negative for activity change, appetite change and fever.  HENT: Positive for congestion and ear pain. Negative for rhinorrhea and sore throat.   Respiratory: Negative for cough, shortness of breath and wheezing.   Gastrointestinal: Negative for diarrhea, nausea and vomiting.  Musculoskeletal: Negative for arthralgias and myalgias.  Skin: Negative for rash.  Neurological: Positive for headaches.  Hematological: Negative.   Psychiatric/Behavioral: Negative.      Physical Exam Triage Vital Signs ED Triage Vitals  Enc Vitals  Group     BP 07/09/20 1422 126/84     Pulse Rate 07/09/20 1422 64     Resp 07/09/20 1422 18     Temp 07/09/20 1422 98.4 F (36.9 C)     Temp Source 07/09/20 1422 Oral     SpO2 07/09/20 1422 98 %     Weight --      Height --      Head Circumference --      Peak Flow --      Pain Score 07/09/20 1420 6     Pain Loc --      Pain Edu? --      Excl. in [place]? --    No data found.  Updated Vital Signs BP 126/84 (BP Location: Right Arm)   Pulse 64   Temp 98.4 F (36.9 C) (Oral)   Resp 18   LMP 07/02/2020   SpO2 98%   Visual Acuity Right Eye Distance:   Left Eye Distance:   Bilateral Distance:    Right Eye Near:   Left Eye Near:    Bilateral Near:     Physical Exam Vitals and nursing note reviewed.  Constitutional:      General: She is not in acute distress.    Appearance: Normal appearance. She is not toxic-appearing.  HENT:     Head: Normocephalic and atraumatic.     Right Ear: Ear canal and external ear normal.     Left Ear: Ear canal and external ear normal.     Ears:     Comments: Bilateral TMs are erythematous with a serous effusion present.    Nose: Congestion and rhinorrhea present.     Comments: Nasal mucosa is erythematous and edematous with clear nasal discharge.    Mouth/Throat:     Mouth: Mucous membranes are moist.     Pharynx: Oropharynx is clear. No posterior oropharyngeal erythema.     Comments: Tear oropharynx is erythematous with clear postnasal drip.  Tonsillar pillars are unremarkable. Cardiovascular:     Rate and Rhythm: Normal rate and regular rhythm.     Pulses: Normal pulses.     Heart sounds: Normal heart sounds. No murmur heard. No gallop.   Pulmonary:     Effort: Pulmonary effort is normal.     Breath sounds: Normal breath sounds. No wheezing, rhonchi or rales.  Musculoskeletal:     Cervical back: Normal range of motion and neck supple.  Lymphadenopathy:     Cervical: No cervical adenopathy.  Skin:    General: Skin is warm and dry.      Capillary Refill: Capillary  refill takes less than 2 seconds.     Findings: No erythema or rash.  Neurological:     General: No focal deficit present.     Mental Status: She is alert and oriented to person, place, and time.  Psychiatric:        Mood and Affect: Mood normal.        Behavior: Behavior normal.        Thought Content: Thought content normal.        Judgment: Judgment normal.      UC Treatments / Results  Labs (all labs ordered are listed, but only abnormal results are displayed) Labs Reviewed  SARS CORONAVIRUS 2 (TAT 6-24 HRS)    EKG   Radiology No results found.  Procedures Procedures (including critical care time)  Medications Ordered in UC Medications - No data to display  Initial Impression / Assessment and Plan / UC Course  I have reviewed the triage vital signs and the nursing notes.  Pertinent labs & imaging results that were available during my care of the patient were reviewed by me and considered in my medical decision making (see chart for details).   Is here for evaluation of a sore throat, headache, stuffy nose to been going on the last 8 days.  Patient is in no acute distress and nontoxic in appearance.  Patient has bilateral serous effusions and erythema to her tympanic membrane's.  These mucosa is erythematous and edematous with clear nasal discharge.  Patient has some posterior oropharyngeal erythema.  Her tonsillar pillars are unremarkable.  There is no exudate.  No cervical lymphadenopathy present.  Lungs are clear to auscultation all fields.  We will discharge patient home with a diagnosis of URI and have her isolate pending the results of her Covid test.  Will give patient doxycycline 100 mg twice daily x10 days for otitis.   Final Clinical Impressions(s) / UC Diagnoses   Final diagnoses:  Upper respiratory tract infection, unspecified type  Non-recurrent acute serous otitis media of both ears     Discharge Instructions      Isolate at home until the results of your Covid test are back.  If your Covid test is positive then you will need to quarantine for 10 days for your symptoms started.  Use Tylenol and ibuprofen as needed for pain and body aches.  Perform sinus irrigation 2-3 times a day with a [employer] sinus rinse kit and distilled water to help relieve your nasal congestion.  Take the doxycycline twice daily for 10 days for your ear infections.  If you develop new or worsening symptoms follow-up with your primary care provider.    ED Prescriptions    Medication Sig Dispense Auth. Provider   doxycycline (VIBRAMYCIN) 100 MG capsule Take 1 capsule (100 mg total) by mouth 2 (two) times daily. 20 capsule Margarette Canada, NP     PDMP not reviewed this encounter.   Margarette Canada, NP 07/09/20 1553

## 2020-07-09 NOTE — Discharge Instructions (Addendum)
Isolate at home until the results of your Covid test are back.  If your Covid test is positive then you will need to quarantine for 10 days for your symptoms started.  Use Tylenol and ibuprofen as needed for pain and body aches.  Perform sinus irrigation 2-3 times a day with a [employer] sinus rinse kit and distilled water to help relieve your nasal congestion.  Take the doxycycline twice daily for 10 days for your ear infections.  If you develop new or worsening symptoms follow-up with your primary care provider.

## 2020-07-10 LAB — SARS CORONAVIRUS 2 (TAT 6-24 HRS): SARS Coronavirus 2: POSITIVE — AB

## 2020-07-12 ENCOUNTER — Ambulatory Visit (HOSPITAL_COMMUNITY)
Admission: EM | Admit: 2020-07-12 | Discharge: 2020-07-12 | Disposition: A | Discharge status: Home / Self Care | Payer: 59

## 2020-07-20 ENCOUNTER — Encounter (HOSPITAL_COMMUNITY): Payer: Self-pay

## 2020-07-20 ENCOUNTER — Ambulatory Visit (HOSPITAL_COMMUNITY)
Admission: EM | Admit: 2020-07-20 | Discharge: 2020-07-20 | Disposition: A | Discharge status: Home / Self Care | Payer: 59 | Attending: Family Medicine | Admitting: Family Medicine

## 2020-07-20 ENCOUNTER — Other Ambulatory Visit: Payer: Self-pay

## 2020-07-20 DIAGNOSIS — K5900 Constipation, unspecified: Secondary | ICD-10-CM | POA: Diagnosis not present

## 2020-07-20 DIAGNOSIS — K2900 Acute gastritis without bleeding: Secondary | ICD-10-CM | POA: Diagnosis not present

## 2020-07-20 MED ORDER — OMEPRAZOLE 40 MG PO CPDR: 40.0000 mg | DELAYED_RELEASE_CAPSULE | Freq: Every day | ORAL | 0 refills | Status: DC

## 2020-07-20 MED ORDER — POLYETHYLENE GLYCOL 3350 17 G PO PACK: 17.0000 g | PACK | Freq: Every day | ORAL | 0 refills | Status: DC

## 2020-07-20 MED ORDER — SIMETHICONE 80 MG PO CHEW: 80.0000 mg | CHEWABLE_TABLET | Freq: Four times a day (QID) | ORAL | 0 refills | Status: DC | PRN

## 2020-07-20 NOTE — ED Triage Notes (Signed)
Pt presents with generalized chest pain after eating or drinking certain foods; pt states sometimes if she drinks warm water and burps it gets better.

## 2020-07-20 NOTE — Discharge Instructions (Signed)
Take the simethicone as needed for gas pain Take miralax as needeed constipation Take omeprazole daily until the heartburn and pain go away , then may take as needed All of these medicines are available without prescription

## 2020-07-20 NOTE — ED Provider Notes (Signed)
[facility]    CSN: [id number] Arrival date & time: 07/20/20  0807      History   Chief Complaint Chief Complaint  Patient presents with  . Gastroesophageal Reflux    HPI Brenda Cannon is a 40 y.o. female.   HPI Patient is here with upper GI symptoms.  Epigastric pain.  Pain goes into her chest.  Belching.  She states that she has had this before.  Also has a history of constipation and has not had a bowel movement for a couple of days.  She has been treated previously with Pepcid, Gas-X, MiraLAX.  She does not have any of these medications.  I reminded her that all of these medicines are available over-the-counter without prescription. She recently took 2 weeks of doxycycline.  She states the antibiotic was "too strong".  She states that it "burns" in her stomach. No vomiting.  No blood in emesis. Past Medical History:  Diagnosis Date  . Abnormal Pap smear    ascus with High Risk for HPV  . De Quervain's tenosynovitis, left   . Difficulty reading   . Gestational diabetes    GDM with last pregnancy (2008)  . History of gestational diabetes   . History of hepatitis B 2007  . History of malaria 2003  . History of thrombocytopenia   . Obese     Patient Active Problem List   Diagnosis Date Noted  . Carpal tunnel syndrome, bilateral 05/31/2020  . Diabetes mellitus type 2 in obese ([place]) 03/24/2015  . Metatarsal stress fracture of left foot 11/10/2013  . Allergic rhinitis 05/26/2012  . 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 History of malaria 03/04/2011  . Hepatitis B infection 03/01/2011  . H/O: C-section 03/01/2011  . History of thrombocytopenia 03/01/2011    Past Surgical History:  Procedure Laterality Date  . CESAREAN SECTION  2008   last pregnacy  . CESAREAN SECTION  08/21/2011   Procedure: CESAREAN SECTION;  Surgeon: Donnamae Jude, MD;  Location: [facility];  Service: Gynecology;  Laterality: N/A;    OB History    Gravida  5   Para   3   Term  3   Preterm  0   AB  2   Living  3     SAB  1   IAB      Ectopic  0   Multiple  0   Live Births  3            Home Medications    Prior to Admission medications   Medication Sig Start Date End Date Taking? Authorizing Provider  omeprazole (PRILOSEC) 40 MG capsule Take 1 capsule (40 mg total) by mouth daily. 07/20/20  Yes Raylene Everts, MD  simethicone (GAS-X) 80 MG chewable tablet Chew 1 tablet (80 mg total) by mouth every 6 (six) hours as needed for flatulence. 07/20/20  Yes Raylene Everts, MD  polyethylene glycol (MIRALAX / GLYCOLAX) 17 g packet Take 17 g by mouth daily. 07/20/20   Raylene Everts, MD  famotidine (PEPCID) 20 MG tablet Take 1 tablet (20 mg total) by mouth 2 (two) times daily. 04/17/20 07/20/20  Loura Halt A, NP  fluticasone (FLONASE) 50 MCG/ACT nasal spray Place 2 sprays into both nostrils daily. Patient not taking: Reported on 12/22/2018 10/08/18 12/22/18  Lestine Box, PA-C    Family History Family History  Problem Relation Age of Onset  . Hypertension Mother   . Stroke Mother  Social History Social History   Tobacco Use  . Smoking status: Never Smoker  . Smokeless tobacco: Never Used  Vaping Use  . Vaping Use: Never used  Substance Use Topics  . Alcohol use: No  . Drug use: No     Allergies   Cephalexin and Chloroquine   Review of Systems Review of Systems See HPI  Physical Exam Triage Vital Signs ED Triage Vitals [07/20/20 0837]  Enc Vitals Group     BP 122/63     Pulse Rate 64     Resp 20     Temp 98.2 F (36.8 C)     Temp Source Oral     SpO2 98 %     Weight      Height      Head Circumference      Peak Flow      Pain Score 4     Pain Loc      Pain Edu?      Excl. in [place]?    No data found.  Updated Vital Signs BP 122/63 (BP Location: Right Arm)   Pulse 64   Temp 98.2 F (36.8 C) (Oral)   Resp 20   LMP 07/02/2020   SpO2 98%      Physical Exam Constitutional:      General:  She is not in acute distress.    Appearance: She is well-developed and well-nourished. She is obese.  HENT:     Head: Normocephalic and atraumatic.     Mouth/Throat:     Mouth: Oropharynx is clear and moist.     Comments: Mask is in place Eyes:     Conjunctiva/sclera: Conjunctivae normal.     Pupils: Pupils are equal, round, and reactive to light.  Cardiovascular:     Rate and Rhythm: Normal rate and regular rhythm.  Pulmonary:     Effort: Pulmonary effort is normal. No respiratory distress.     Breath sounds: Normal breath sounds.  Chest:     Chest wall: No tenderness.  Abdominal:     General: There is no distension.     Palpations: Abdomen is soft.     Tenderness: There is abdominal tenderness.     Comments: Tenderness in the midepigastrium.  No mass or organomegaly.  No chest tenderness  Musculoskeletal:        General: No edema. Normal range of motion.     Cervical back: Normal range of motion and neck supple.  Skin:    General: Skin is warm and dry.  Neurological:     Mental Status: She is alert.  Psychiatric:        Behavior: Behavior normal.      UC Treatments / Results  Labs (all labs ordered are listed, but only abnormal results are displayed) Labs Reviewed - No data to display  EKG   Radiology No results found.  Procedures Procedures (including critical care time)  Medications Ordered in UC Medications - No data to display  Initial Impression / Assessment and Plan / UC Course  I have reviewed the triage vital signs and the nursing notes.  Pertinent labs & imaging results that were available during my care of the patient were reviewed by me and considered in my medical decision making (see chart for details).     Likely gastritis, flare of GERD due to the doxycycline antibiotic.  She states she did not take it with food.  We will treat with omeprazole.  Patient requests refill of  simethicone.  Patient requests refill of MiraLAX for her constipation.   Refill is given, recommend follow-up with PCP Final Clinical Impressions(s) / UC Diagnoses   Final diagnoses:  Other acute gastritis, presence of bleeding unspecified  Constipation, unspecified constipation type     Discharge Instructions     Take the simethicone as needed for gas pain Take miralax as needeed constipation Take omeprazole daily until the heartburn and pain go away , then may take as needed All of these medicines are available without prescription   ED Prescriptions    Medication Sig Dispense Auth. Provider   polyethylene glycol (MIRALAX / GLYCOLAX) 17 g packet Take 17 g by mouth daily. 14 each Raylene Everts, MD   simethicone (GAS-X) 80 MG chewable tablet Chew 1 tablet (80 mg total) by mouth every 6 (six) hours as needed for flatulence. 30 tablet Raylene Everts, MD   omeprazole (PRILOSEC) 40 MG capsule Take 1 capsule (40 mg total) by mouth daily. 30 capsule Raylene Everts, MD     PDMP not reviewed this encounter.   Raylene Everts, MD 07/20/20 [id number]

## 2020-08-04 ENCOUNTER — Emergency Department (HOSPITAL_COMMUNITY)
Admission: EM | Admit: 2020-08-04 | Discharge: 2020-08-04 | Disposition: A | Discharge status: Home / Self Care | Payer: 59 | Attending: Emergency Medicine | Admitting: Emergency Medicine

## 2020-08-04 ENCOUNTER — Emergency Department (HOSPITAL_COMMUNITY): Payer: 59

## 2020-08-04 ENCOUNTER — Encounter (HOSPITAL_COMMUNITY): Payer: Self-pay | Admitting: Emergency Medicine

## 2020-08-04 DIAGNOSIS — E119 Type 2 diabetes mellitus without complications: Secondary | ICD-10-CM | POA: Insufficient documentation

## 2020-08-04 DIAGNOSIS — R12 Heartburn: Secondary | ICD-10-CM | POA: Diagnosis not present

## 2020-08-04 DIAGNOSIS — R072 Precordial pain: Secondary | ICD-10-CM | POA: Insufficient documentation

## 2020-08-04 DIAGNOSIS — R002 Palpitations: Secondary | ICD-10-CM | POA: Insufficient documentation

## 2020-08-04 LAB — I-STAT CHEM 8, ED
BUN: 14 mg/dL (ref 6–20)
Calcium, Ion: 1.23 mmol/L (ref 1.15–1.40)
Chloride: 106 mmol/L (ref 98–111)
Creatinine, Ser: 0.9 mg/dL (ref 0.44–1.00)
Glucose, Bld: 159 mg/dL — ABNORMAL HIGH (ref 70–99)
HCT: 36 % (ref 36.0–46.0)
Hemoglobin: 12.2 g/dL (ref 12.0–15.0)
Potassium: 3.8 mmol/L (ref 3.5–5.1)
Sodium: 140 mmol/L (ref 135–145)
TCO2: 28 mmol/L (ref 22–32)

## 2020-08-04 LAB — CBC WITH DIFFERENTIAL/PLATELET
Abs Immature Granulocytes: 0.01 10*3/uL (ref 0.00–0.07)
Basophils Absolute: 0 10*3/uL (ref 0.0–0.1)
Basophils Relative: 0 %
Eosinophils Absolute: 0.1 10*3/uL (ref 0.0–0.5)
Eosinophils Relative: 2 %
HCT: 37.8 % (ref 36.0–46.0)
Hemoglobin: 12.2 g/dL (ref 12.0–15.0)
Immature Granulocytes: 0 %
Lymphocytes Relative: 50 %
Lymphs Abs: 1.8 10*3/uL (ref 0.7–4.0)
MCH: 29.1 pg (ref 26.0–34.0)
MCHC: 32.3 g/dL (ref 30.0–36.0)
MCV: 90.2 fL (ref 80.0–100.0)
Monocytes Absolute: 0.3 10*3/uL (ref 0.1–1.0)
Monocytes Relative: 8 %
Neutro Abs: 1.4 10*3/uL — ABNORMAL LOW (ref 1.7–7.7)
Neutrophils Relative %: 40 %
Platelets: 97 10*3/uL — ABNORMAL LOW (ref 150–400)
RBC: 4.19 MIL/uL (ref 3.87–5.11)
RDW: 13.2 % (ref 11.5–15.5)
WBC: 3.7 10*3/uL — ABNORMAL LOW (ref 4.0–10.5)
nRBC: 0 % (ref 0.0–0.2)

## 2020-08-04 LAB — I-STAT BETA HCG BLOOD, ED (MC, WL, AP ONLY): I-stat hCG, quantitative: <5 m[IU]/mL (ref <5)

## 2020-08-04 LAB — TROPONIN I (HIGH SENSITIVITY): Troponin I (High Sensitivity): 5 ng/L (ref <18)

## 2020-08-04 MED ORDER — SUCRALFATE 1 GM/10ML PO SUSP: 1.0000 g | Freq: Three times a day (TID) | ORAL | 0 refills | Status: DC

## 2020-08-04 MED ORDER — IOHEXOL 350 MG/ML SOLN
100.0000 mL | Freq: Once | INTRAVENOUS | Status: AC | PRN
  Administered 2020-08-04: 100 mL via INTRAVENOUS

## 2020-08-04 MED ORDER — LIDOCAINE VISCOUS HCL 2 % MT SOLN
15.0000 mL | Freq: Once | OROMUCOSAL | Status: AC
  Administered 2020-08-04: 15 mL via ORAL
  Filled 2020-08-04: qty 15

## 2020-08-04 MED ORDER — ALUM & MAG HYDROXIDE-SIMETH 200-200-20 MG/5ML PO SUSP
30.0000 mL | Freq: Once | ORAL | Status: AC
  Administered 2020-08-04: 30 mL via ORAL
  Filled 2020-08-04: qty 30

## 2020-08-04 NOTE — ED Provider Notes (Signed)
[facility] DEPT Provider Note   CSN: [id number] Arrival date & time: 08/04/20  0320     History Chief Complaint  Patient presents with  . Chest Pain    Brenda Cannon is a 40 y.o. female.  The history is provided by the patient.  Chest Pain Pain location:  Substernal area Pain quality: dull   Pain radiates to:  Does not radiate Pain severity:  Moderate Onset quality:  Gradual Duration:  4 days Timing:  Constant Progression:  Waxing and waning Chronicity:  New Context: movement   Relieved by:  Nothing Worsened by:  Nothing Ineffective treatments:  None tried Associated symptoms: heartburn and palpitations   Associated symptoms: no abdominal pain, no AICD problem, no back pain, no claudication, no cough, no diaphoresis, no dysphagia, no fever, no lower extremity edema, no nausea, no near-syncope, no numbness, no orthopnea, no shortness of breath, no syncope, no vomiting and no weakness   Associated symptoms comment:  Is supposed to be on Prilosec for GERD but stopped after 4 days Risk factors: not female and no smoking   Patient with GERD who presents with palpitations and CP for 4 days. No DOE, no exertional symptoms.  No n/v/d.  No leg pain or swelling.       Past Medical History:  Diagnosis Date  . Abnormal Pap smear    ascus with High Risk for HPV  . De Quervain's tenosynovitis, left   . Difficulty reading   . Gestational diabetes    GDM with last pregnancy (2008)  . History of gestational diabetes   . History of hepatitis B 2007  . History of malaria 2003  . History of thrombocytopenia   . Obese     Patient Active Problem List   Diagnosis Date Noted  . Carpal tunnel syndrome, bilateral 05/31/2020  . Diabetes mellitus type 2 in obese ([place]) 03/24/2015  . Metatarsal stress fracture of left foot 11/10/2013  . Allergic rhinitis 05/26/2012  . 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 History of malaria 03/04/2011  .  Hepatitis B infection 03/01/2011  . H/O: C-section 03/01/2011  . History of thrombocytopenia 03/01/2011    Past Surgical History:  Procedure Laterality Date  . CESAREAN SECTION  2008   last pregnacy  . CESAREAN SECTION  08/21/2011   Procedure: CESAREAN SECTION;  Surgeon: Donnamae Jude, MD;  Location: [facility];  Service: Gynecology;  Laterality: N/A;     OB History    Gravida  5   Para  3   Term  3   Preterm  0   AB  2   Living  3     SAB  1   IAB      Ectopic  0   Multiple  0   Live Births  3           Family History  Problem Relation Age of Onset  . Hypertension Mother   . Stroke Mother     Social History   Tobacco Use  . Smoking status: Never Smoker  . Smokeless tobacco: Never Used  Vaping Use  . Vaping Use: Never used  Substance Use Topics  . Alcohol use: No  . Drug use: No    Home Medications Prior to Admission medications   Medication Sig Start Date End Date Taking? Authorizing Provider  omeprazole (PRILOSEC) 40 MG capsule Take 1 capsule (40 mg total) by mouth daily. 07/20/20  Yes Raylene Everts, MD  polyethylene glycol (MIRALAX / GLYCOLAX) 17 g packet Take 17 g by mouth daily. 07/20/20  Yes Raylene Everts, MD  simethicone (GAS-X) 80 MG chewable tablet Chew 1 tablet (80 mg total) by mouth every 6 (six) hours as needed for flatulence. 07/20/20  Yes Raylene Everts, MD  famotidine (PEPCID) 20 MG tablet Take 1 tablet (20 mg total) by mouth 2 (two) times daily. 04/17/20 07/20/20  Loura Halt A, NP  fluticasone (FLONASE) 50 MCG/ACT nasal spray Place 2 sprays into both nostrils daily. Patient not taking: Reported on 12/22/2018 10/08/18 12/22/18  Lestine Box, PA-C    Allergies    Cephalexin and Chloroquine  Review of Systems   Review of Systems  Constitutional: Negative for diaphoresis and fever.  HENT: Negative for trouble swallowing.   Eyes: Negative for visual disturbance.  Respiratory: Negative for cough and shortness of breath.    Cardiovascular: Positive for chest pain and palpitations. Negative for orthopnea, claudication, syncope and near-syncope.  Gastrointestinal: Positive for heartburn. Negative for abdominal pain, nausea and vomiting.  Genitourinary: Negative for difficulty urinating.  Musculoskeletal: Negative for back pain.  Skin: Negative for rash.  Neurological: Negative for weakness and numbness.  Psychiatric/Behavioral: Negative for agitation.  All other systems reviewed and are negative.   Physical Exam Updated Vital Signs BP 109/80   Pulse 61   Temp 98 F (36.7 C)   Resp 19   Ht 5\' 1"  (1.549 m)   Wt 90.7 kg   LMP 07/31/2020   SpO2 97%   BMI 37.79 kg/m   Physical Exam Vitals and nursing note reviewed.  Constitutional:      General: She is not in acute distress.    Appearance: Normal appearance.  HENT:     Head: Normocephalic and atraumatic.     Nose: Nose normal.  Eyes:     Conjunctiva/sclera: Conjunctivae normal.     Pupils: Pupils are equal, round, and reactive to light.  Cardiovascular:     Rate and Rhythm: Normal rate and regular rhythm.     Pulses: Normal pulses.     Heart sounds: Normal heart sounds.  Pulmonary:     Effort: Pulmonary effort is normal.     Breath sounds: Normal breath sounds.  Abdominal:     General: Abdomen is flat. Bowel sounds are normal.     Palpations: Abdomen is soft.     Tenderness: There is no abdominal tenderness. There is no guarding.  Musculoskeletal:        General: Normal range of motion.     Cervical back: Normal range of motion and neck supple.  Skin:    General: Skin is warm and dry.     Capillary Refill: Capillary refill takes less than 2 seconds.  Neurological:     General: No focal deficit present.     Mental Status: She is alert and oriented to person, place, and time.     Deep Tendon Reflexes: Reflexes normal.  Psychiatric:        Thought Content: Thought content normal.     ED Results / Procedures / Treatments   Labs (all  labs ordered are listed, but only abnormal results are displayed) Results for orders placed or performed during the hospital encounter of 08/04/20  CBC with Differential/Platelet  Result Value Ref Range   WBC 3.7 (L) 4.0 - 10.5 K/uL   RBC 4.19 3.87 - 5.11 MIL/uL   Hemoglobin 12.2 12.0 - 15.0 g/dL   HCT 37.8 36.0 - 46.0 %  MCV 90.2 80.0 - 100.0 fL   MCH 29.1 26.0 - 34.0 pg   MCHC 32.3 30.0 - 36.0 g/dL   RDW 13.2 11.5 - 15.5 %   Platelets 97 (L) 150 - 400 K/uL   nRBC 0.0 0.0 - 0.2 %   Neutrophils Relative % 40 %   Neutro Abs 1.4 (L) 1.7 - 7.7 K/uL   Lymphocytes Relative 50 %   Lymphs Abs 1.8 0.7 - 4.0 K/uL   Monocytes Relative 8 %   Monocytes Absolute 0.3 0.1 - 1.0 K/uL   Eosinophils Relative 2 %   Eosinophils Absolute 0.1 0.0 - 0.5 K/uL   Basophils Relative 0 %   Basophils Absolute 0.0 0.0 - 0.1 K/uL   Immature Granulocytes 0 %   Abs Immature Granulocytes 0.01 0.00 - 0.07 K/uL   Reactive, Benign Lymphocytes PRESENT   I-stat chem 8, ED (not at [facility] or [facility])  Result Value Ref Range   Sodium 140 135 - 145 mmol/L   Potassium 3.8 3.5 - 5.1 mmol/L   Chloride 106 98 - 111 mmol/L   BUN 14 6 - 20 mg/dL   Creatinine, Ser 0.90 0.44 - 1.00 mg/dL   Glucose, Bld 159 (H) 70 - 99 mg/dL   Calcium, Ion 1.23 1.15 - 1.40 mmol/L   TCO2 28 22 - 32 mmol/L   Hemoglobin 12.2 12.0 - 15.0 g/dL   HCT 36.0 36.0 - 46.0 %  I-Stat Beta hCG blood, ED ([facility], [facility], [facility] only)  Result Value Ref Range   I-stat hCG, quantitative <5.0 <5 mIU/mL   Comment 3          Troponin I (High Sensitivity)  Result Value Ref Range   Troponin I (High Sensitivity) 5 <18 ng/L   No results found.  EKG  Date: 08/04/2020  Rate: 60  Rhythm: normal sinus rhythm  QRS Axis: normal  Intervals: normal  ST/T Wave abnormalities: normal  Conduction Disutrbances: none  Narrative Interpretation: unremarkable     Radiology No results found.  Procedures Procedures   Medications Ordered in ED Medications  alum & mag  hydroxide-simeth (MAALOX/MYLANTA) 200-200-20 MG/5ML suspension 30 mL (30 mLs Oral Given 08/04/20 0340)    And  lidocaine (XYLOCAINE) 2 % viscous mouth solution 15 mL (15 mLs Oral Given 08/04/20 0340)    ED Course  I have reviewed the triage vital signs and the nursing notes.  Pertinent labs & imaging results that were available during my care of the patient were reviewed by me and considered in my medical decision making (see chart for details).   Ruled out for MI and PE in the ED given ongoing symptoms for > 24 hours straight.  HEART score is 1 very low risk for MACE.  I suspect there is a component of GERD and a component of MSK pain.  I will start carafate and have advised tylenol for pain as it will not upset the stomach.  Brenda Cannon was evaluated in Emergency Department on 08/04/2020 for the symptoms described in the history of present illness. She was evaluated in the context of the global COVID-19 pandemic, which necessitated consideration that the patient might be at risk for infection with the SARS-CoV-2 virus that causes COVID-19. Institutional protocols and algorithms that pertain to the evaluation of patients at risk for COVID-19 are in a state of rapid change based on information released by regulatory bodies including the CDC and federal and state organizations. These policies and algorithms were followed during the patient's  care in the ED.  Final Clinical Impression(s) / ED Diagnoses Return for intractable cough, coughing up blood, fevers >100.4 unrelieved by medication, shortness of breath, intractable vomiting, chest pain, shortness of breath, weakness, numbness, changes in speech, facial asymmetry, abdominal pain, passing out, Inability to tolerate liquids or food, cough, altered mental status or any concerns. No signs of systemic illness or infection. The patient is nontoxic-appearing on exam and vital signs are within normal limits.  I have reviewed the triage vital signs  and the nursing notes. Pertinent labs & imaging results that were available during my care of the patient were reviewed by me and considered in my medical decision making (see chart for details). After history, exam, and medical workup I feel the patient has been appropriately medically screened and is safe for discharge home. Pertinent diagnoses were discussed with the patient. Patient was given return precautions.    Shaqueena Mauceri, MD 08/04/20 [phone]

## 2020-08-04 NOTE — ED Triage Notes (Signed)
Pt c/o midline CP and lower back pain x 4 days, states she has been having intermittent palpitations as well. Denies cardiac hx

## 2020-09-26 ENCOUNTER — Ambulatory Visit (HOSPITAL_COMMUNITY)
Admission: EM | Admit: 2020-09-26 | Discharge: 2020-09-26 | Disposition: A | Discharge status: Home / Self Care | Payer: 59 | Attending: Student | Admitting: Student

## 2020-09-26 ENCOUNTER — Other Ambulatory Visit: Payer: Self-pay

## 2020-09-26 DIAGNOSIS — Z3202 Encounter for pregnancy test, result negative: Secondary | ICD-10-CM | POA: Diagnosis not present

## 2020-09-26 DIAGNOSIS — K2900 Acute gastritis without bleeding: Secondary | ICD-10-CM | POA: Diagnosis not present

## 2020-09-26 DIAGNOSIS — K5901 Slow transit constipation: Secondary | ICD-10-CM | POA: Insufficient documentation

## 2020-09-26 LAB — POCT URINALYSIS DIPSTICK, ED / UC
Bilirubin Urine: NEGATIVE
Glucose, UA: NEGATIVE mg/dL
Hgb urine dipstick: NEGATIVE
Ketones, ur: NEGATIVE mg/dL
Leukocytes,Ua: NEGATIVE
Nitrite: NEGATIVE
Protein, ur: NEGATIVE mg/dL
Specific Gravity, Urine: >=1.03 (ref 1.005–1.030)
Urobilinogen, UA: 0.2 mg/dL (ref 0.0–1.0)
pH: 6 (ref 5.0–8.0)

## 2020-09-26 LAB — POC URINE PREG, ED: Preg Test, Ur: NEGATIVE

## 2020-09-26 MED ORDER — OMEPRAZOLE 40 MG PO CPDR: 40.0000 mg | DELAYED_RELEASE_CAPSULE | Freq: Every day | ORAL | 0 refills | Status: DC

## 2020-09-26 MED ORDER — DOCUSATE SODIUM 100 MG PO CAPS: 100.0000 mg | ORAL_CAPSULE | Freq: Two times a day (BID) | ORAL | 0 refills | Status: DC

## 2020-09-26 NOTE — Discharge Instructions (Signed)
-  We are going to start a stool softener (Colace), 1 to 2 pills daily until you start having regular bowel movements.  Try taking 2 pills today.  Once you start having regular bowel movements, you can decrease this to 1 pill as needed. -Make sure to drink plenty of water and eat a high-fiber diet with lots of vegetables and fruits. -Restart the omeprazole-Prilosec (omeprazole), 1 pill daily for the next week.  Continue this as needed for stomach pain. [facility] ED return precautions. If you do not have a bowel movement within the next 24 hours, head to the ED. If abdominal pain gets worse, head to the ED. New symptoms like blood in urine, dizziness, nausea, vaginal discharge, fevers- head straight to the ED.  -If you continue to have these symptoms, please follow-up with your primary care for a referral to gastroenterology.

## 2020-09-26 NOTE — ED Triage Notes (Signed)
PT reports for one week she has had Bil. Side pain ,dysuria and constipation.

## 2020-09-26 NOTE — ED Provider Notes (Signed)
[facility]    CSN: [id number] Arrival date & time: 09/26/20  0805      History   Chief Complaint Chief Complaint  Patient presents with  . Bil side pain   . Dysuria  . Constipation    HPI Brenda Cannon is a 40 y.o. female presenting for abdominal pain. History gastritis, constipation, obesity, gestational diabetes. Not currently taking the miralax or omeprazole that she was prescribed in the past for these symptoms. Abdominal pain is worse after eating. States last bowel movement was 3 days ago but she is still passing gas. States she is eating and drinking normally. Occ dysuria with urinating, denies hematuria, denies history UTI in the past, denies history of kidney stones. Denies hematuria, dysuria, frequency, urgency, back pain, n/v/d, hematemesis, BRBPR, melena, fevers/chills, abdnormal vaginal discharge, denies new partners, denies pain radiating to groin. Last period was 1 month ago.    HPI  Past Medical History:  Diagnosis Date  . Abnormal Pap smear    ascus with High Risk for HPV  . De Quervain's tenosynovitis, left   . Difficulty reading   . Gestational diabetes    GDM with last pregnancy (2008)  . History of gestational diabetes   . History of hepatitis B 2007  . History of malaria 2003  . History of thrombocytopenia   . Obese     Patient Active Problem List   Diagnosis Date Noted  . Carpal tunnel syndrome, bilateral 05/31/2020  . Diabetes mellitus type 2 in obese ([place]) 03/24/2015  . Metatarsal stress fracture of left foot 11/10/2013  . Allergic rhinitis 05/26/2012  . 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 History of malaria 03/04/2011  . Hepatitis B infection 03/01/2011  . H/O: C-section 03/01/2011  . History of thrombocytopenia 03/01/2011    Past Surgical History:  Procedure Laterality Date  . CESAREAN SECTION  2008   last pregnacy  . CESAREAN SECTION  08/21/2011   Procedure: CESAREAN SECTION;  Surgeon: Donnamae Jude,  MD;  Location: [facility];  Service: Gynecology;  Laterality: N/A;    OB History    Gravida  5   Para  3   Term  3   Preterm  0   AB  2   Living  3     SAB  1   IAB      Ectopic  0   Multiple  0   Live Births  3            Home Medications    Prior to Admission medications   Medication Sig Start Date End Date Taking? Authorizing Provider  docusate sodium (COLACE) 100 MG capsule Take 1 capsule (100 mg total) by mouth every 12 (twelve) hours. 09/26/20  Yes Hazel Sams, PA-C  omeprazole (PRILOSEC) 40 MG capsule Take 1 capsule (40 mg total) by mouth daily. 09/26/20  Yes Hazel Sams, PA-C  polyethylene glycol (MIRALAX / GLYCOLAX) 17 g packet Take 17 g by mouth daily. 07/20/20   Raylene Everts, MD  simethicone (GAS-X) 80 MG chewable tablet Chew 1 tablet (80 mg total) by mouth every 6 (six) hours as needed for flatulence. 07/20/20   Raylene Everts, MD  sucralfate (CARAFATE) 1 GM/10ML suspension Take 10 mLs (1 g total) by mouth 4 (four) times daily -  with meals and at bedtime. 08/04/20   Palumbo, April, MD  famotidine (PEPCID) 20 MG tablet Take 1 tablet (20 mg total) by mouth 2 (two)  times daily. 04/17/20 07/20/20  Loura Halt A, NP  fluticasone (FLONASE) 50 MCG/ACT nasal spray Place 2 sprays into both nostrils daily. Patient not taking: Reported on 12/22/2018 10/08/18 12/22/18  Lestine Box, PA-C    Family History Family History  Problem Relation Age of Onset  . Hypertension Mother   . Stroke Mother     Social History Social History   Tobacco Use  . Smoking status: Never Smoker  . Smokeless tobacco: Never Used  Vaping Use  . Vaping Use: Never used  Substance Use Topics  . Alcohol use: No  . Drug use: No     Allergies   Cephalexin and Chloroquine   Review of Systems Review of Systems  Constitutional: Negative for appetite change, chills, diaphoresis, fever and unexpected weight change.  HENT: Negative for congestion, ear pain, sinus pressure,  sinus pain, sneezing, sore throat and trouble swallowing.   Respiratory: Negative for cough, chest tightness and shortness of breath.   Cardiovascular: Negative for chest pain.  Gastrointestinal: Positive for abdominal pain and constipation. Negative for abdominal distention, anal bleeding, blood in stool, diarrhea, nausea, rectal pain and vomiting.  Genitourinary: Negative for dysuria, flank pain, frequency and urgency.  Musculoskeletal: Negative for back pain and myalgias.  Neurological: Negative for dizziness, light-headedness and headaches.  All other systems reviewed and are negative.    Physical Exam Triage Vital Signs ED Triage Vitals  Enc Vitals Group     BP      Pulse      Resp      Temp      Temp src      SpO2      Weight      Height      Head Circumference      Peak Flow      Pain Score      Pain Loc      Pain Edu?      Excl. in [place]?    No data found.  Updated Vital Signs BP 121/83 (BP Location: Right Arm)   Pulse 64   Temp 98.3 F (36.8 C) (Oral)   Resp 16   LMP 09/04/2020   SpO2 100%   Visual Acuity Right Eye Distance:   Left Eye Distance:   Bilateral Distance:    Right Eye Near:   Left Eye Near:    Bilateral Near:     Physical Exam Vitals reviewed.  Constitutional:      General: She is not in acute distress.    Appearance: Normal appearance. She is not ill-appearing.  HENT:     Head: Normocephalic and atraumatic.     Mouth/Throat:     Mouth: Mucous membranes are moist.     Comments: Moist mucous membranes Eyes:     Extraocular Movements: Extraocular movements intact.     Pupils: Pupils are equal, round, and reactive to light.  Cardiovascular:     Rate and Rhythm: Normal rate and regular rhythm.     Heart sounds: Normal heart sounds.  Pulmonary:     Effort: Pulmonary effort is normal.     Breath sounds: Normal breath sounds. No wheezing, rhonchi or rales.  Abdominal:     General: Bowel sounds are normal. There is no distension.      Palpations: Abdomen is soft. There is no mass.     Tenderness: There is generalized abdominal tenderness and tenderness in the epigastric area. There is no right CVA tenderness, left CVA tenderness, guarding or rebound. Negative signs  include Murphy's sign, Rovsing's sign and McBurney's sign.     Comments: Mild generalized abd tenderness to palpation, worst in epigastric region. Bowel sounds positive throughout.  Skin:    General: Skin is warm.     Capillary Refill: Capillary refill takes less than 2 seconds.     Comments: Good skin turgor  Neurological:     General: No focal deficit present.     Mental Status: She is alert and oriented to person, place, and time.  Psychiatric:        Mood and Affect: Mood normal.        Behavior: Behavior normal.      UC Treatments / Results  Labs (all labs ordered are listed, but only abnormal results are displayed) Labs Reviewed  URINE CULTURE  POCT URINALYSIS DIPSTICK, ED / UC  POC URINE PREG, ED    EKG   Radiology No results found.  Procedures Procedures (including critical care time)  Medications Ordered in UC Medications - No data to display  Initial Impression / Assessment and Plan / UC Course  I have reviewed the triage vital signs and the nursing notes.  Pertinent labs & imaging results that were available during my care of the patient were reviewed by me and considered in my medical decision making (see chart for details).     This patient is a 40 year old female presenting with constipation. History of constipation and gastritis in the past, currently taking nothing for this. Today this pt is afebrile nontachycardic nontachypneic, oxygenating well on room air, no wheezes rhonchi or rales. Appears well hydrated.  UA today wnl. Culture sent. Urine pregnancy negative.  Low suspicion for kidney stone given generalized pain, no hematuria, UA wnl. Low suspicion for bowel obstruction as she is still passing gas and bowel sounds  are positive throughout.   Plan to treat conservatively with Colace and Prilosec.  Follow-up with PCP or GI if symptoms worsen or persist.  STRICT ED return precautions. Trial of colace for 1 day. If she does not have a bowel movement within the next 24 hours, head to the ED. If abdominal pain gets worse, head to the ED. New symptoms like hematuria, hematemesis, dizziness, nausea, vaginal discharge- head straight to the ED. Patient verbalizes understanding and agreement.  This chart was dictated using voice recognition software, Dragon. Despite the best efforts of this provider to proofread and correct errors, errors may still occur which can change documentation meaning.    Final Clinical Impressions(s) / UC Diagnoses   Final diagnoses:  Slow transit constipation  Acute gastritis without hemorrhage, unspecified gastritis type     Discharge Instructions     -We are going to start a stool softener (Colace), 1 to 2 pills daily until you start having regular bowel movements.  Try taking 2 pills today.  Once you start having regular bowel movements, you can decrease this to 1 pill as needed. -Make sure to drink plenty of water and eat a high-fiber diet with lots of vegetables and fruits. -Restart the omeprazole-Prilosec (omeprazole), 1 pill daily for the next week.  Continue this as needed for stomach pain. [facility] ED return precautions. If you do not have a bowel movement within the next 24 hours, head to the ED. If abdominal pain gets worse, head to the ED. New symptoms like blood in urine, dizziness, nausea, vaginal discharge, fevers- head straight to the ED.  -If you continue to have these symptoms, please follow-up with your primary care for a referral  to gastroenterology.    ED Prescriptions    Medication Sig Dispense Auth. Provider   docusate sodium (COLACE) 100 MG capsule Take 1 capsule (100 mg total) by mouth every 12 (twelve) hours. 60 capsule Hazel Sams, PA-C   omeprazole  (PRILOSEC) 40 MG capsule Take 1 capsule (40 mg total) by mouth daily. 30 capsule Hazel Sams, PA-C     PDMP not reviewed this encounter.   Hazel Sams, PA-C 09/26/20 [phone]

## 2020-09-27 LAB — URINE CULTURE

## 2020-10-02 ENCOUNTER — Other Ambulatory Visit: Payer: Self-pay | Admitting: Internal Medicine

## 2020-10-03 LAB — CBC
HCT: 39.9 % (ref 35.0–45.0)
Hemoglobin: 12.9 g/dL (ref 11.7–15.5)
MCH: 28.6 pg (ref 27.0–33.0)
MCHC: 32.3 g/dL (ref 32.0–36.0)
MCV: 88.5 fL (ref 80.0–100.0)
MPV: 12.1 fL (ref 7.5–12.5)
Platelets: 109 10*3/uL — ABNORMAL LOW (ref 140–400)
RBC: 4.51 10*6/uL (ref 3.80–5.10)
RDW: 13.1 % (ref 11.0–15.0)
WBC: 3.1 10*3/uL — ABNORMAL LOW (ref 3.8–10.8)

## 2020-10-03 LAB — LIPID PANEL
Cholesterol: 155 mg/dL (ref <200)
HDL: 65 mg/dL (ref >=50)
LDL Cholesterol (Calc): 72 mg/dL (calc)
Non-HDL Cholesterol (Calc): 90 mg/dL (calc) (ref <130)
Total CHOL/HDL Ratio: 2.4 (calc) (ref <5.0)
Triglycerides: 92 mg/dL (ref <150)

## 2020-10-03 LAB — COMPLETE METABOLIC PANEL WITH GFR
AG Ratio: 1.2 (calc) (ref 1.0–2.5)
ALT: 22 U/L (ref 6–29)
AST: 27 U/L (ref 10–30)
Albumin: 3.6 g/dL (ref 3.6–5.1)
Alkaline phosphatase (APISO): 71 U/L (ref 31–125)
BUN: 17 mg/dL (ref 7–25)
CO2: 25 mmol/L (ref 20–32)
Calcium: 8.6 mg/dL (ref 8.6–10.2)
Chloride: 109 mmol/L (ref 98–110)
Creat: 0.67 mg/dL (ref 0.50–1.10)
GFR, Est African American: 129 mL/min/{1.73_m2} (ref >=60)
GFR, Est Non African American: 112 mL/min/{1.73_m2} (ref >=60)
Globulin: 3 g/dL (calc) (ref 1.9–3.7)
Glucose, Bld: 114 mg/dL — ABNORMAL HIGH (ref 65–99)
Potassium: 4 mmol/L (ref 3.5–5.3)
Sodium: 140 mmol/L (ref 135–146)
Total Bilirubin: 0.5 mg/dL (ref 0.2–1.2)
Total Protein: 6.6 g/dL (ref 6.1–8.1)

## 2020-10-03 LAB — VITAMIN D 25 HYDROXY (VIT D DEFICIENCY, FRACTURES): Vit D, 25-Hydroxy: 16 ng/mL — ABNORMAL LOW (ref 30–100)

## 2020-10-03 LAB — TSH: TSH: 0.83 mIU/L

## 2020-10-09 ENCOUNTER — Encounter: Payer: Self-pay | Admitting: Obstetrics

## 2020-11-06 ENCOUNTER — Encounter: Payer: Self-pay | Admitting: Advanced Practice Midwife

## 2020-11-06 ENCOUNTER — Other Ambulatory Visit: Payer: Self-pay

## 2020-11-06 ENCOUNTER — Ambulatory Visit (INDEPENDENT_AMBULATORY_CARE_PROVIDER_SITE_OTHER): Payer: 59 | Admitting: Advanced Practice Midwife

## 2020-11-06 ENCOUNTER — Other Ambulatory Visit (HOSPITAL_COMMUNITY)
Admission: RE | Admit: 2020-11-06 | Discharge: 2020-11-06 | Disposition: A | Discharge status: Home / Self Care | Payer: 59 | Source: Ambulatory Visit | Attending: Advanced Practice Midwife | Admitting: Advanced Practice Midwife

## 2020-11-06 VITALS — BP 116/81 | HR 61 | Ht 61.0 in | Wt 200.0 lb

## 2020-11-06 DIAGNOSIS — Z113 Encounter for screening for infections with a predominantly sexual mode of transmission: Secondary | ICD-10-CM | POA: Insufficient documentation

## 2020-11-06 DIAGNOSIS — N898 Other specified noninflammatory disorders of vagina: Secondary | ICD-10-CM

## 2020-11-06 DIAGNOSIS — Z124 Encounter for screening for malignant neoplasm of cervix: Secondary | ICD-10-CM | POA: Diagnosis not present

## 2020-11-06 DIAGNOSIS — R87810 Cervical high risk human papillomavirus (HPV) DNA test positive: Secondary | ICD-10-CM | POA: Insufficient documentation

## 2020-11-06 DIAGNOSIS — B181 Chronic viral hepatitis B without delta-agent: Secondary | ICD-10-CM

## 2020-11-06 DIAGNOSIS — Z01419 Encounter for gynecological examination (general) (routine) without abnormal findings: Secondary | ICD-10-CM | POA: Diagnosis not present

## 2020-11-06 DIAGNOSIS — Z1151 Encounter for screening for human papillomavirus (HPV): Secondary | ICD-10-CM | POA: Insufficient documentation

## 2020-11-06 DIAGNOSIS — O98419 Viral hepatitis complicating pregnancy, unspecified trimester: Secondary | ICD-10-CM

## 2020-11-06 NOTE — Progress Notes (Signed)
Subjective:     Brenda Cannon is a 40 y.o. female here at [facility] for a routine exam.  Current complaints: vaginal pain/discharge with menses only x 2-3 months.  Personal health questionnaire reviewed: yes.  Do you have a primary care provider? yes Do you feel safe at home? yes  [place] Office Visit from 11/06/2020 in [place]  PHQ-2 Total Score 0      Risk factors for chronic health problems: Smoking: Never smoker Alchohol/how much: None Pt BMI: Body mass index is 37.79 kg/m.   Gynecologic History Patient's last menstrual period was 11/01/2020. Contraception: abstinence Last Pap: unknown.  Last mammogram: n/a.   Obstetric History OB History  Gravida Para Term Preterm AB Living  5 3 3  0 2 3  SAB IAB Ectopic Multiple Live Births  1   0 0 3    # Outcome Date GA Lbr Len/2nd Weight Sex Delivery Anes PTL Lv  5 Term 08/21/11 [redacted]w[redacted]d  8 lb 6 oz (3.799 kg) F CS-LTranv Spinal  LIV  4 Term 10/29/06 [redacted]w[redacted]d  7 lb 14 oz (3.572 kg) M CS-Unspec EPI  LIV  3 AB 2004 [redacted]w[redacted]d      N DEC     Birth Comments: D and C  2 Term 06/23/01 [redacted]w[redacted]d 24:00 10 lb (4.536 kg) F Vag-Spont None  LIV  1 SAB 1999 [redacted]w[redacted]d   U  None       The following portions of the patient's history were reviewed and updated as appropriate: allergies, current medications, past family history, past medical history, past social history, past surgical history and problem list.  Review of Systems Pertinent items noted in HPI and remainder of comprehensive ROS otherwise negative.    Objective:   BP 116/81   Pulse 61   Ht 5\' 1"  (1.549 m)   Wt 200 lb (90.7 kg)   LMP 11/01/2020   BMI 37.79 kg/m  VS reviewed, nursing note reviewed,  Constitutional: well developed, well nourished, no distress HEENT: normocephalic CV: normal rate Pulm/chest wall: normal effort Breast Exam: performed: right breast normal without mass, skin or nipple changes or axillary nodes, left breast normal without  mass, skin or nipple changes or axillary nodes Abdomen: soft Neuro: alert and oriented x 3 Skin: warm, dry Psych: affect normal Pelvic exam: Performed: Cervix pink, visually closed, without lesion, scant white creamy discharge, vaginal walls and external genitalia normal Bimanual exam: Cervix 0/long/high, firm, anterior, neg CMT, uterus nontender, nonenlarged, adnexa without tenderness, enlargement, or mass       Assessment/Plan:   1. Well woman exam with routine gynecological exam --Doing well, regular periods monthly. Abstinence for contraception currently.   --Reports recent changes to diet and exercise for improved health  2. Encounter for screening for cervical cancer - Cytology - PAP( [facility])  3. Screen for STD (sexually transmitted disease)  - RPR+HBsAg+HCVAb+... - Cervicovaginal ancillary only( [place])  4. Vaginal lesion --Pt with irritation/pain with recent periods x 2-3 months.  2-3 tiny flat lesions on left labia noted on exam today.  Contact dermatitis from menstrual pad vs HSV. Swab collected today.  Will treat with topical steroid if no HSV found. Pt to switch to all cotton disposable or to washable menstrual pads to decrease allergy. - Herpes simplex virus culture   Follow up in: 1 year or as needed.   Fatima Blank, CNM 9:46 AM

## 2020-11-06 NOTE — Progress Notes (Signed)
  Pt states she doesn't know how long since last pap, several years. Pt would like std/infection screening today.

## 2020-11-06 NOTE — Patient Instructions (Signed)
Preventive Care 21-39 Years Old, Female Preventive care refers to lifestyle choices and visits with your health care provider that can promote health and wellness. This includes:  A yearly physical exam. This is also called an annual wellness visit.  Regular dental and eye exams.  Immunizations.  Screening for certain conditions.  Healthy lifestyle choices, such as: ? Eating a healthy diet. ? Getting regular exercise. ? Not using drugs or products that contain nicotine and tobacco. ? Limiting alcohol use. What can I expect for my preventive care visit? Physical exam Your health care provider may check your:  Height and weight. These may be used to calculate your BMI (body mass index). BMI is a measurement that tells if you are at a healthy weight.  Heart rate and blood pressure.  Body temperature.  Skin for abnormal spots. Counseling Your health care provider may ask you questions about your:  Past medical problems.  Family's medical history.  Alcohol, tobacco, and drug use.  Emotional well-being.  Home life and relationship well-being.  Sexual activity.  Diet, exercise, and sleep habits.  Work and work environment.  Access to firearms.  Method of birth control.  Menstrual cycle.  Pregnancy history. What immunizations do I need? Vaccines are usually given at various ages, according to a schedule. Your health care provider will recommend vaccines for you based on your age, medical history, and lifestyle or other factors, such as travel or where you work.   What tests do I need? Blood tests  Lipid and cholesterol levels. These may be checked every 5 years starting at age 20.  Hepatitis C test.  Hepatitis B test. Screening  Diabetes screening. This is done by checking your blood sugar (glucose) after you have not eaten for a while (fasting).  STD (sexually transmitted disease) testing, if you are at risk.  BRCA-related cancer screening. This may be  done if you have a family history of breast, ovarian, tubal, or peritoneal cancers.  Pelvic exam and Pap test. This may be done every 3 years starting at age 21. Starting at age 30, this may be done every 5 years if you have a Pap test in combination with an HPV test. Talk with your health care provider about your test results, treatment options, and if necessary, the need for more tests.   Follow these instructions at home: Eating and drinking  Eat a healthy diet that includes fresh fruits and vegetables, whole grains, lean protein, and low-fat dairy products.  Take vitamin and mineral supplements as recommended by your health care provider.  Do not drink alcohol if: ? Your health care provider tells you not to drink. ? You are pregnant, may be pregnant, or are planning to become pregnant.  If you drink alcohol: ? Limit how much you have to 0-1 drink a day. ? Be aware of how much alcohol is in your drink. In the [place] one drink equals one 12 oz bottle of beer (355 mL), one 5 oz glass of wine (148 mL), or one 1 oz glass of hard liquor (44 mL).   Lifestyle  Take daily care of your teeth and gums. Brush your teeth every morning and night with fluoride toothpaste. Floss one time each day.  Stay active. Exercise for at least 30 minutes 5 or more days each week.  Do not use any products that contain nicotine or tobacco, such as cigarettes, e-cigarettes, and chewing tobacco. If you need help quitting, ask your health care provider.  Do not   use drugs.  If you are sexually active, practice safe sex. Use a condom or other form of protection to prevent STIs (sexually transmitted infections).  If you do not wish to become pregnant, use a form of birth control. If you plan to become pregnant, see your health care provider for a prepregnancy visit.  Find healthy ways to cope with stress, such as: ? Meditation, yoga, or listening to music. ? Journaling. ? Talking to a trusted  person. ? Spending time with friends and family. Safety  Always wear your seat belt while driving or riding in a vehicle.  Do not drive: ? If you have been drinking alcohol. Do not ride with someone who has been drinking. ? When you are tired or distracted. ? While texting.  Wear a helmet and other protective equipment during sports activities.  If you have firearms in your house, make sure you follow all gun safety procedures.  Seek help if you have been physically or sexually abused. What's next?  Go to your health care provider once a year for an annual wellness visit.  Ask your health care provider how often you should have your eyes and teeth checked.  Stay up to date on all vaccines. This information is not intended to replace advice given to you by your health care provider. Make sure you discuss any questions you have with your health care provider. Document Revised: 02/20/2020 Document Reviewed: 03/05/2018 Elsevier Patient Education  2021 Elsevier Inc.  

## 2020-11-07 LAB — CERVICOVAGINAL ANCILLARY ONLY
Bacterial Vaginitis (gardnerella): NEGATIVE
Candida Glabrata: NEGATIVE
Candida Vaginitis: NEGATIVE
Chlamydia: NEGATIVE
Comment: NEGATIVE
Comment: NEGATIVE
Comment: NEGATIVE
Comment: NEGATIVE
Comment: NEGATIVE
Comment: NORMAL
Neisseria Gonorrhea: NEGATIVE
Trichomonas: NEGATIVE

## 2020-11-07 LAB — CYTOLOGY - PAP
Comment: NEGATIVE
Diagnosis: NEGATIVE
High risk HPV: POSITIVE — AB

## 2020-11-08 ENCOUNTER — Telehealth: Payer: Self-pay | Admitting: Advanced Practice Midwife

## 2020-11-08 ENCOUNTER — Encounter: Payer: Self-pay | Admitting: Advanced Practice Midwife

## 2020-11-08 DIAGNOSIS — R87810 Cervical high risk human papillomavirus (HPV) DNA test positive: Secondary | ICD-10-CM | POA: Insufficient documentation

## 2020-11-08 NOTE — Telephone Encounter (Signed)
Called pt to notify her of Pap results of positive HPV, with normal cytology. Pap is needed in 1 year according to ASCCP guidelines. Pt STD testing all normal as well.  HSV swab collected due to some vaginal irritation and this test is still pending. Pt given opportunity to ask questions and states understanding.

## 2020-11-09 LAB — HERPES SIMPLEX VIRUS CULTURE

## 2020-11-10 LAB — RPR+HBSAG+HCVAB+...
HIV Screen 4th Generation wRfx: NONREACTIVE
Hep C Virus Ab: <0.1 s/co ratio (ref 0.0–0.9)
RPR Ser Ql: NONREACTIVE

## 2020-11-10 LAB — HEPATITIS B SURFACE AG, CONFIRM: HBsAG Confirmation: POSITIVE — AB

## 2020-11-17 NOTE — Addendum Note (Signed)
Addended by: Fatima Blank A on: 11/17/2020 08:08 AM   Modules accepted: Orders

## 2020-11-20 ENCOUNTER — Other Ambulatory Visit: Payer: 59

## 2020-11-20 ENCOUNTER — Other Ambulatory Visit: Payer: Self-pay

## 2020-11-20 DIAGNOSIS — R768 Other specified abnormal immunological findings in serum: Secondary | ICD-10-CM

## 2020-11-22 ENCOUNTER — Encounter: Payer: Self-pay | Admitting: Advanced Practice Midwife

## 2020-11-22 LAB — HEPATITIS B DNA, ULTRAQUANTITATIVE, PCR
HBV DNA SERPL PCR-ACNC: 11400 IU/mL
HBV DNA SERPL PCR-LOG IU: 4.057 log10 IU/mL

## 2020-11-22 LAB — HEPATITIS B SURFACE ANTIBODY, QUANTITATIVE: Hepatitis B Surf Ab Quant: <3.1 m[IU]/mL — ABNORMAL LOW (ref >9.9)

## 2020-11-22 LAB — HEPATITIS B CORE ANTIBODY, IGM: Hep B C IgM: POSITIVE — AB

## 2020-11-22 LAB — HEPATITIS B CORE ANTIBODY, TOTAL: Hep B Core Total Ab: POSITIVE — AB

## 2020-12-23 ENCOUNTER — Encounter (HOSPITAL_COMMUNITY): Payer: Self-pay | Admitting: Emergency Medicine

## 2020-12-23 ENCOUNTER — Emergency Department (HOSPITAL_COMMUNITY): Payer: 59

## 2020-12-23 ENCOUNTER — Emergency Department (HOSPITAL_COMMUNITY)
Admission: EM | Admit: 2020-12-23 | Discharge: 2020-12-23 | Disposition: A | Discharge status: Home / Self Care | Payer: 59 | Attending: Emergency Medicine | Admitting: Emergency Medicine

## 2020-12-23 ENCOUNTER — Other Ambulatory Visit: Payer: Self-pay

## 2020-12-23 DIAGNOSIS — R079 Chest pain, unspecified: Secondary | ICD-10-CM

## 2020-12-23 DIAGNOSIS — E119 Type 2 diabetes mellitus without complications: Secondary | ICD-10-CM | POA: Diagnosis not present

## 2020-12-23 DIAGNOSIS — R072 Precordial pain: Secondary | ICD-10-CM | POA: Insufficient documentation

## 2020-12-23 LAB — BASIC METABOLIC PANEL
Anion gap: 5 (ref 5–15)
BUN: 14 mg/dL (ref 6–20)
CO2: 25 mmol/L (ref 22–32)
Calcium: 8.7 mg/dL — ABNORMAL LOW (ref 8.9–10.3)
Chloride: 106 mmol/L (ref 98–111)
Creatinine, Ser: 0.71 mg/dL (ref 0.44–1.00)
GFR, Estimated: >60 mL/min (ref >60)
Glucose, Bld: 195 mg/dL — ABNORMAL HIGH (ref 70–99)
Potassium: 3.6 mmol/L (ref 3.5–5.1)
Sodium: 136 mmol/L (ref 135–145)

## 2020-12-23 LAB — CBC
HCT: 39.2 % (ref 36.0–46.0)
Hemoglobin: 12.8 g/dL (ref 12.0–15.0)
MCH: 29.9 pg (ref 26.0–34.0)
MCHC: 32.7 g/dL (ref 30.0–36.0)
MCV: 91.6 fL (ref 80.0–100.0)
Platelets: 104 10*3/uL — ABNORMAL LOW (ref 150–400)
RBC: 4.28 MIL/uL (ref 3.87–5.11)
RDW: 13.5 % (ref 11.5–15.5)
WBC: 3.7 10*3/uL — ABNORMAL LOW (ref 4.0–10.5)
nRBC: 0 % (ref 0.0–0.2)

## 2020-12-23 LAB — TROPONIN I (HIGH SENSITIVITY)
Troponin I (High Sensitivity): 4 ng/L (ref <18)
Troponin I (High Sensitivity): 3 ng/L (ref <18)

## 2020-12-23 LAB — I-STAT BETA HCG BLOOD, ED (MC, WL, AP ONLY): I-stat hCG, quantitative: <5 m[IU]/mL (ref <5)

## 2020-12-23 MED ORDER — OMEPRAZOLE 20 MG PO CPDR: 20.0000 mg | DELAYED_RELEASE_CAPSULE | Freq: Two times a day (BID) | ORAL | 0 refills | Status: DC

## 2020-12-23 MED ORDER — LIDOCAINE VISCOUS HCL 2 % MT SOLN
15.0000 mL | Freq: Once | OROMUCOSAL | Status: AC
  Administered 2020-12-23: 15 mL via ORAL
  Filled 2020-12-23: qty 15

## 2020-12-23 MED ORDER — ALUM & MAG HYDROXIDE-SIMETH 200-200-20 MG/5ML PO SUSP
30.0000 mL | Freq: Once | ORAL | Status: AC
  Administered 2020-12-23: 30 mL via ORAL
  Filled 2020-12-23: qty 30

## 2020-12-23 NOTE — Discharge Instructions (Addendum)
Your chest pain work-up here was overall unremarkable.  Your symptoms are likely caused by your history of reflux.  -Prescription sent to the pharmacy for Prilosec.  This is the same medicine you are taking in the past.  When you need refills in the future you should get this from her primary care doctor.  Follow-up with a primary care doctor for recheck

## 2020-12-23 NOTE — ED Notes (Signed)
Pt states relief of pain from medication.

## 2020-12-23 NOTE — ED Provider Notes (Signed)
[facility] DEPT Provider Note   CSN: [id number] Arrival date & time: 12/23/20  0352     History Chief Complaint  Patient presents with   Chest Pain    Brenda Cannon is a 40 y.o. female.  40 year old female with a history of gestational diabetes, obesity, esophageal reflux/gastritis presents to the emergency department for evaluation of midsternal chest pain.  Reports that chest pain began on Monday and has been fairly constant, waxing and waning in severity.  When pain is present she feels short of breath.  She has had improvement to her pain with use of a liquid antiacid.  She has history of similar symptoms which resolved after prolonged use of Prilosec, but she ran out of this medication and did not restart it until her pain returned 5 days ago.  No associated fever, syncope, vomiting, leg swelling, nausea.  No PMH or FHx of ACS or CAD.  No hx of tobacco use/abuse.  The history is provided by the patient. No language interpreter was used.  Chest Pain     Past Medical History:  Diagnosis Date   Abnormal Pap smear    ascus with High Risk for HPV   De Quervain's tenosynovitis, left    Difficulty reading    Gestational diabetes    GDM with last pregnancy (2008)   History of gestational diabetes    History of hepatitis B 2007   History of malaria 2003   History of thrombocytopenia    Obese     Patient Active Problem List   Diagnosis Date Noted   Papanicolaou smear of cervix with positive high risk human papilloma virus (HPV) test 11/08/2020   Carpal tunnel syndrome, bilateral 05/31/2020   Diabetes mellitus type 2 in obese ([place]) 03/24/2015   Metatarsal stress fracture of left foot 11/10/2013   Allergic rhinitis 05/26/2012   Carpal tunnel syndrome of left wrist 04/15/2011   History of malaria 03/04/2011   Chronic hepatitis B ([place]) 03/01/2011   H/O: C-section 03/01/2011   History of thrombocytopenia 03/01/2011    Past Surgical History:   Procedure Laterality Date   CESAREAN SECTION  2008   last pregnacy   CESAREAN SECTION  08/21/2011   Procedure: CESAREAN SECTION;  Surgeon: Donnamae Jude, MD;  Location: [facility];  Service: Gynecology;  Laterality: N/A;     OB History     Gravida  5   Para  3   Term  3   Preterm  0   AB  2   Living  3      SAB  1   IAB      Ectopic  0   Multiple  0   Live Births  3           Family History  Problem Relation Age of Onset   Hypertension Mother    Stroke Mother     Social History   Tobacco Use   Smoking status: Never   Smokeless tobacco: Never  Vaping Use   Vaping Use: Never used  Substance Use Topics   Alcohol use: No   Drug use: No    Home Medications Prior to Admission medications   Medication Sig Start Date End Date Taking? Authorizing Provider  Alum & Mag Hydroxide-Simeth (ANTACID LIQUID PO) Take 30 mLs by mouth daily as needed.   Yes [provider]  omeprazole (PRILOSEC) 20 MG capsule Take 20 mg by mouth 2 (two) times daily before a meal.  Yes [provider]  cholecalciferol (VITAMIN D3) 25 MCG (1000 UNIT) tablet Take 1,000 Units by mouth daily.    [provider]  famotidine (PEPCID) 20 MG tablet Take 1 tablet (20 mg total) by mouth 2 (two) times daily. 04/17/20 07/20/20  Loura Halt A, NP  fluticasone (FLONASE) 50 MCG/ACT nasal spray Place 2 sprays into both nostrils daily. Patient not taking: Reported on 12/22/2018 10/08/18 12/22/18  Lestine Box, PA-C    Allergies    Cephalexin and Chloroquine  Review of Systems   Review of Systems  Cardiovascular:  Positive for chest pain.  Ten systems reviewed and are negative for acute change, except as noted in the HPI.    Physical Exam Updated Vital Signs BP (!) 141/87   Pulse (!) 55   Temp 98 F (36.7 C) (Oral)   Resp 18   SpO2 100%   Physical Exam Vitals and nursing note reviewed.  Constitutional:      General: She is not in acute distress.    Appearance:  She is well-developed. She is not diaphoretic.     Comments: Nontoxic appearing and in NAD. Obese AA female.  HENT:     Head: Normocephalic and atraumatic.  Eyes:     General: No scleral icterus.    Conjunctiva/sclera: Conjunctivae normal.  Cardiovascular:     Rate and Rhythm: Normal rate and regular rhythm.     Pulses: Normal pulses.  Pulmonary:     Effort: Pulmonary effort is normal. No respiratory distress.     Breath sounds: No stridor. No wheezing or rales.     Comments: Respirations even and unlabored Musculoskeletal:        General: Normal range of motion.     Cervical back: Normal range of motion.  Skin:    General: Skin is warm and dry.     Coloration: Skin is not pale.     Findings: No erythema or rash.  Neurological:     Mental Status: She is alert and oriented to person, place, and time.     Coordination: Coordination normal.  Psychiatric:        Behavior: Behavior normal.    ED Results / Procedures / Treatments   Labs (all labs ordered are listed, but only abnormal results are displayed) Labs Reviewed  BASIC METABOLIC PANEL - Abnormal; Notable for the following components:      Result Value   Glucose, Bld 195 (*)    Calcium 8.7 (*)    All other components within normal limits  CBC  I-STAT BETA HCG BLOOD, ED ([facility], [facility], [facility] ONLY)  TROPONIN I (HIGH SENSITIVITY)  TROPONIN I (HIGH SENSITIVITY)    EKG EKG Interpretation  Date/Time:  Saturday December 23 2020 04:03:22 EDT Ventricular Rate:  64 PR Interval:  162 QRS Duration: 93 QT Interval:  400 QTC Calculation: 413 R Axis:   50 Text Interpretation: Sinus rhythm Nonspecific T wave abnormality No significant change was found Confirmed by Shanon Rosser [phone]) on 12/23/2020 4:07:09 AM  Radiology DG Chest 2 View  Result Date: 12/23/2020 CLINICAL DATA:  Chest and epigastric pain for several days. EXAM: CHEST - 2 VIEW COMPARISON:  01/01/2019 FINDINGS: Heart size is at the upper limits of normal and stable. Both lungs  are clear. No evidence of pneumothorax or pleural effusion. IMPRESSION: Stable exam. No active lung disease. Electronically Signed   By: Marlaine Hind M.D.   On: 12/23/2020 04:59    Procedures Procedures   Medications Ordered in ED Medications  alum & mag hydroxide-simeth (MAALOX/MYLANTA) 200-200-20 MG/5ML suspension 30 mL (30 mLs Oral Given 12/23/20 0427)    And  lidocaine (XYLOCAINE) 2 % viscous mouth solution 15 mL (15 mLs Oral Given 12/23/20 0427)    ED Course  I have reviewed the triage vital signs and the nursing notes.  Pertinent labs & imaging results that were available during my care of the patient were reviewed by me and considered in my medical decision making (see chart for details).    MDM Rules/Calculators/A&P                          40 year old female presents to the emergency department for evaluation of chest pain.  She has had similar pain in the past associated with gastritis.  Was on maintenance Prilosec until this medication ran out.  Did not restart it until her pain recurred on Monday.  Has improvement in her symptoms with generic over-the-counter Maalox liquid.  Was given a GI cocktail in the ED.  Patient's EKG today is unchanged from prior tracings.  Initial troponin negative with stable chest x-ray.  No acute cardiopulmonary abnormality.  Pending delta troponin, though anticipate discharge if negative for patient to follow-up with her primary care doctor.  Low suspicion for emergent cardiac etiology.  Likely acute on chronic episode of reflux/gastritis.  Care signed out to [facility], PA-C at change of shift.   Final Clinical Impression(s) / ED Diagnoses Final diagnoses:  Nonspecific chest pain    Rx / DC Orders ED Discharge Orders     None        Antonietta Breach, PA-C 12/23/20 0628    Shanon Rosser, MD 12/23/20 [phone]

## 2020-12-23 NOTE — ED Triage Notes (Signed)
Patient here from home reporting mid epigastric chest pain radiating up into throat that started Monday. Denies n/v.

## 2020-12-23 NOTE — ED Provider Notes (Signed)
Care assumed from K. Humes PA-C at shift change pending second troponin.  See her note for full H&P.   Briefly this is a 40 yo female presenting with epigastric pain x 5 days. She has history of GERD and ran out of Prilosec. Work up overall unremarkable here. Patient given GI cocktail and symptoms improved.   Physical Exam  BP (!) 141/87   Pulse (!) 55   Temp 98 F (36.7 C) (Oral)   Resp 18   SpO2 100%   PE: Constitutional: well-developed, well-nourished, no apparent distress HENT: normocephalic, atraumatic. no cervical adenopathy Cardiovascular: normal rate and rhythm, distal pulses intact Pulmonary/Chest: effort normal; breath sounds clear and equal bilaterally; no wheezes or rales Abdominal: soft and nontender Musculoskeletal: full ROM, no edema Neurological: alert with goal directed thinking Skin: warm and dry, no rash, no diaphoresis Psychiatric: normal mood and affect, normal behavior    ED Course/Procedures      EKG Interpretation  Date/Time:  Saturday December 23 2020 04:03:22 EDT Ventricular Rate:  64 PR Interval:  162 QRS Duration: 93 QT Interval:  400 QTC Calculation: 413 R Axis:   50 Text Interpretation: Sinus rhythm Nonspecific T wave abnormality No significant change was found Confirmed by [place], Jenny Reichmann [phone]) on 12/23/2020 4:07:09 AM         Results for orders placed or performed during the hospital encounter of 12/23/20 (from the past 24 hour(s))  Basic metabolic panel     Status: Abnormal   Collection Time: 12/23/20  4:04 AM  Result Value Ref Range   Sodium 136 135 - 145 mmol/L   Potassium 3.6 3.5 - 5.1 mmol/L   Chloride 106 98 - 111 mmol/L   CO2 25 22 - 32 mmol/L   Glucose, Bld 195 (H) 70 - 99 mg/dL   BUN 14 6 - 20 mg/dL   Creatinine, Ser 0.71 0.44 - 1.00 mg/dL   Calcium 8.7 (L) 8.9 - 10.3 mg/dL   GFR, Estimated >60 >60 mL/min   Anion gap 5 5 - 15  CBC     Status: Abnormal   Collection Time: 12/23/20  4:04 AM  Result Value Ref Range   WBC 3.7  (L) 4.0 - 10.5 K/uL   RBC 4.28 3.87 - 5.11 MIL/uL   Hemoglobin 12.8 12.0 - 15.0 g/dL   HCT 39.2 36.0 - 46.0 %   MCV 91.6 80.0 - 100.0 fL   MCH 29.9 26.0 - 34.0 pg   MCHC 32.7 30.0 - 36.0 g/dL   RDW 13.5 11.5 - 15.5 %   Platelets 104 (L) 150 - 400 K/uL   nRBC 0.0 0.0 - 0.2 %  Troponin I (High Sensitivity)     Status: None   Collection Time: 12/23/20  4:04 AM  Result Value Ref Range   Troponin I (High Sensitivity) 4 <18 ng/L  I-Stat beta hCG blood, ED     Status: None   Collection Time: 12/23/20  4:21 AM  Result Value Ref Range   I-stat hCG, quantitative <5.0 <5 mIU/mL   Comment 3          Troponin I (High Sensitivity)     Status: None   Collection Time: 12/23/20  6:10 AM  Result Value Ref Range   Troponin I (High Sensitivity) 3 <18 ng/L   CHEST - 2 VIEW     COMPARISON:  01/01/2019     FINDINGS:  Heart size is at the upper limits of normal and stable. Both lungs  are clear. No  evidence of pneumothorax or pleural effusion.     IMPRESSION:  Stable exam. No active lung disease.        Electronically Signed    By: Marlaine Hind M.D.    On: 12/23/2020 04:59       MDM  Patient received in sign out. Please see previous provider note to include MDM up to this point.   Second troponin is 3, delta troponin flat.  Given work-up was overall unremarkable and symptoms improved with GI cocktail patient is stable to be discharged home.  Prescription for Prilosec sent to her pharmacy.  Recommend close PCP follow-up.  Strict return precautions were discussed.  Patient agreeable with plan of care.  Discharged home in stable condition.   Portions of this note were generated with [employer]. Dictation errors may occur despite best attempts at proofreading.        Barrie Folk, PA-C 12/23/20 0715    Milton Ferguson, MD 12/25/20 1000

## 2020-12-23 NOTE — ED Notes (Signed)
Pt discharged from this ED in stable condition at this time. All discharge instructions and follow up care reviewed with pt with no further questions at this time. Pt ambulatory with steady gait, clear speech.  

## 2021-03-21 ENCOUNTER — Encounter: Payer: Self-pay | Admitting: Physician Assistant

## 2021-03-21 ENCOUNTER — Ambulatory Visit (INDEPENDENT_AMBULATORY_CARE_PROVIDER_SITE_OTHER): Payer: 59 | Admitting: Physician Assistant

## 2021-03-21 ENCOUNTER — Other Ambulatory Visit (INDEPENDENT_AMBULATORY_CARE_PROVIDER_SITE_OTHER): Payer: 59

## 2021-03-21 VITALS — BP 100/62 | HR 64 | Ht 59.25 in | Wt 192.0 lb

## 2021-03-21 DIAGNOSIS — B181 Chronic viral hepatitis B without delta-agent: Secondary | ICD-10-CM

## 2021-03-21 DIAGNOSIS — K7460 Unspecified cirrhosis of liver: Secondary | ICD-10-CM

## 2021-03-21 LAB — HCG, QUANTITATIVE, PREGNANCY: Quantitative HCG: 0.62 m[IU]/mL

## 2021-03-21 LAB — CBC WITH DIFFERENTIAL/PLATELET
Basophils Absolute: 0 10*3/uL (ref 0.0–0.1)
Basophils Relative: 0.7 % (ref 0.0–3.0)
Eosinophils Absolute: 0 10*3/uL (ref 0.0–0.7)
Eosinophils Relative: 1.7 % (ref 0.0–5.0)
HCT: 40.2 % (ref 36.0–46.0)
Hemoglobin: 13.3 g/dL (ref 12.0–15.0)
Lymphocytes Relative: 30.8 % (ref 12.0–46.0)
Lymphs Abs: 0.9 10*3/uL (ref 0.7–4.0)
MCHC: 32.9 g/dL (ref 30.0–36.0)
MCV: 90 fl (ref 78.0–100.0)
Monocytes Absolute: 0.3 10*3/uL (ref 0.1–1.0)
Monocytes Relative: 10.5 % (ref 3.0–12.0)
Neutro Abs: 1.6 10*3/uL (ref 1.4–7.7)
Neutrophils Relative %: 56.3 % (ref 43.0–77.0)
Platelets: 94 10*3/uL — ABNORMAL LOW (ref 150.0–400.0)
RBC: 4.47 Mil/uL (ref 3.87–5.11)
RDW: 13.3 % (ref 11.5–15.5)
WBC: 2.8 10*3/uL — ABNORMAL LOW (ref 4.0–10.5)

## 2021-03-21 LAB — COMPREHENSIVE METABOLIC PANEL
ALT: 56 U/L — ABNORMAL HIGH (ref 0–35)
AST: 58 U/L — ABNORMAL HIGH (ref 0–37)
Albumin: 3.8 g/dL (ref 3.5–5.2)
Alkaline Phosphatase: 45 U/L (ref 39–117)
BUN: 15 mg/dL (ref 6–23)
CO2: 29 mEq/L (ref 19–32)
Calcium: 9.1 mg/dL (ref 8.4–10.5)
Chloride: 106 mEq/L (ref 96–112)
Creatinine, Ser: 0.83 mg/dL (ref 0.40–1.20)
GFR: 88.93 mL/min (ref >60.00)
Glucose, Bld: 135 mg/dL — ABNORMAL HIGH (ref 70–99)
Potassium: 3.5 mEq/L (ref 3.5–5.1)
Sodium: 140 mEq/L (ref 135–145)
Total Bilirubin: 0.7 mg/dL (ref 0.2–1.2)
Total Protein: 6.9 g/dL (ref 6.0–8.3)

## 2021-03-21 LAB — PROTIME-INR
INR: 1.3 ratio — ABNORMAL HIGH (ref 0.8–1.0)
Prothrombin Time: 14.1 s — ABNORMAL HIGH (ref 9.6–13.1)

## 2021-03-21 NOTE — Patient Instructions (Signed)
Your provider has requested that you go to the basement level for lab work before leaving today. Press "B" on the elevator. The lab is located at the first door on the left as you exit the elevator.  You have been scheduled for an abdominal ultrasound at [facility] (1st floor of hospital) on Friday 03/30/21 at 10:30 am. Please arrive 15 minutes prior to your appointment for registration. Make certain not to have anything to eat or drink 6 hours prior to your appointment. Should you need to reschedule your appointment, please contact radiology at [phone]. This test typically takes about 30 minutes to perform.  If you are age 50 or older, your body mass index should be between 23-30. Your Body mass index is 38.45 kg/m. If this is out of the aforementioned range listed, please consider follow up with your Primary Care Provider.  If you are age 12 or younger, your body mass index should be between 19-25. Your Body mass index is 38.45 kg/m. If this is out of the aformentioned range listed, please consider follow up with your Primary Care Provider.   __________________________________________________________  The [facility] providers would like to encourage you to use [facility] to communicate with providers for non-urgent requests or questions.  Due to long hold times on the telephone, sending your provider a message by [facility] may be a faster and more efficient way to get a response.  Please allow 48 business hours for a response.  Please remember that this is for non-urgent requests.

## 2021-03-21 NOTE — Progress Notes (Signed)
Chief Complaint: Chronic hepatitis B  HPI:    Brenda Cannon is a 40 year old female with a past medical history as listed below, who was referred to our clinic for her chronic hepatitis B.    03/10/2015 CT renal stone study showed a nodular hepatic contour suggesting cirrhosis.    08/04/2020 CT of the angio and chest for suspicion of pulmonary embolus also showed cirrhosis.    11/20/2020 HBV DNA 11,400.    02/20/2021 hepatitis B core antibody reactive.  Hepatitis B core IgM reactive.  CMP with an AST elevated at 62 and ALT at 70.    Today, the patient presents to clinic and tells me that she was first told she was positive for hepatitis B when she had just moved to our country from [place] in 2007 and was pregnant with her first child.  At that time she was told she could be on medication for this but that she would have to take it every day and did not want to be on a medication every day.  Tells me she was not really educated on what this meant.  Now tells me that "all of my other labs are okay", but she continues to be positive for hep B and has had some further education and is wanting to proceed with treatment.  Associated symptoms include intermittent epigastric/right upper quadrant discomfort.    Denies fever, chills, abdominal distention, weight loss, change in bowel habits, weight loss, nausea or vomiting.  Past Medical History:  Diagnosis Date   Abnormal Pap smear    ascus with High Risk for HPV   De Quervain's tenosynovitis, left    Difficulty reading    Gestational diabetes    GDM with last pregnancy (2008)   History of gestational diabetes    History of hepatitis B 2007   History of malaria 2003   History of thrombocytopenia    Obese     Past Surgical History:  Procedure Laterality Date   CESAREAN SECTION  2008   last pregnacy   CESAREAN SECTION  08/21/2011   Procedure: CESAREAN SECTION;  Surgeon: Donnamae Jude, MD;  Location: [facility];  Service: Gynecology;  Laterality: N/A;     Current Outpatient Medications  Medication Sig Dispense Refill   Alum & Mag Hydroxide-Simeth (ANTACID LIQUID PO) Take 30 mLs by mouth daily as needed.     cholecalciferol (VITAMIN D3) 25 MCG (1000 UNIT) tablet Take 1,000 Units by mouth daily.     omeprazole (PRILOSEC) 20 MG capsule Take 1 capsule (20 mg total) by mouth 2 (two) times daily before a meal. 60 capsule 0   No current facility-administered medications for this visit.    Allergies as of 03/21/2021 - Review Complete 12/23/2020  Allergen Reaction Noted   Cephalexin Itching and Rash 03/25/2011   Chloroquine Hives 06/28/2012    Family History  Problem Relation Age of Onset   Hypertension Mother    Stroke Mother     Social History   Socioeconomic History   Marital status: Single    Spouse name: Not on file   Number of children: Not on file   Years of education: Not on file   Highest education level: Not on file  Occupational History   Not on file  Tobacco Use   Smoking status: Never   Smokeless tobacco: Never  Vaping Use   Vaping Use: Never used  Substance and Sexual Activity   Alcohol use: No   Drug use: No  Sexual activity: Not Currently    Birth control/protection: None  Other Topics Concern   Not on file  Social History Narrative   ** Merged History Encounter **       Social Determinants of Health   Financial Resource Strain: Not on file  Food Insecurity: Not on file  Transportation Needs: Not on file  Physical Activity: Not on file  Stress: Not on file  Social Connections: Not on file  Intimate Partner Violence: Not on file    Review of Systems:    Constitutional: No weight loss, fever or chills Skin: No rash  Cardiovascular: No chest pain Respiratory: No SOB Gastrointestinal: See HPI and otherwise negative Genitourinary: No dysuria  Neurological: No headache, dizziness or syncope Musculoskeletal: No new muscle or joint pain Hematologic: No bleeding  Psychiatric: No history of  depression or anxiety   Physical Exam:  Vital signs: BP 100/62 (BP Location: Left Arm, Patient Position: Sitting, Cuff Size: Normal)   Pulse 64   Ht 4' 11.25" (1.505 m) Comment: height measured without shoes  Wt 192 lb (87.1 kg)   LMP 02/21/2021   BMI 38.45 kg/m    Constitutional:   Pleasant obese AA female appears to be in NAD, Well developed, Well nourished, alert and cooperative Head:  Normocephalic and atraumatic. Eyes:   PEERL, EOMI. No icterus. Conjunctiva pink. Ears:  Normal auditory acuity. Neck:  Supple Throat: Oral cavity and pharynx without inflammation, swelling or lesion.  Respiratory: Respirations even and unlabored. Lungs clear to auscultation bilaterally.   No wheezes, crackles, or rhonchi.  Cardiovascular: Normal S1, S2. No MRG. Regular rate and rhythm. No peripheral edema, cyanosis or pallor.  Gastrointestinal:  Soft, nondistended, mild epigastric/RUQ ttp, No rebound or guarding. Normal bowel sounds. No appreciable masses or hepatomegaly. Rectal:  Not performed.  Msk:  Symmetrical without gross deformities. Without edema, no deformity or joint abnormality.  Neurologic:  Alert and  oriented x4;  grossly normal neurologically.  Skin:   Dry and intact without significant lesions or rashes. Psychiatric: Demonstrates good judgement and reason without abnormal affect or behaviors.  RELEVANT LABS AND IMAGING: CBC    Component Value Date/Time   WBC 3.7 (L) 12/23/2020 0404   RBC 4.28 12/23/2020 0404   HGB 12.8 12/23/2020 0404   HCT 39.2 12/23/2020 0404   PLT 104 (L) 12/23/2020 0404   MCV 91.6 12/23/2020 0404   MCH 29.9 12/23/2020 0404   MCHC 32.7 12/23/2020 0404   RDW 13.5 12/23/2020 0404   LYMPHSABS 1.8 08/04/2020 0342   MONOABS 0.3 08/04/2020 0342   EOSABS 0.1 08/04/2020 0342   BASOSABS 0.0 08/04/2020 0342    CMP     Component Value Date/Time   NA 136 12/23/2020 0404   K 3.6 12/23/2020 0404   CL 106 12/23/2020 0404   CO2 25 12/23/2020 0404   GLUCOSE 195  (H) 12/23/2020 0404   BUN 14 12/23/2020 0404   CREATININE 0.71 12/23/2020 0404   CREATININE 0.67 10/02/2020 0000   CALCIUM 8.7 (L) 12/23/2020 0404   PROT 6.6 10/02/2020 0000   ALBUMIN 3.6 04/29/2020 1559   AST 27 10/02/2020 0000   ALT 22 10/02/2020 0000   ALKPHOS 50 04/29/2020 1559   BILITOT 0.5 10/02/2020 0000   GFRNONAA >60 12/23/2020 0404   GFRNONAA 112 10/02/2020 0000   GFRAA 129 10/02/2020 0000    Assessment: 1.  Acute on chronic hepatitis B: Initially diagnosed in 2007, has never had treatment for this 2.  Compensated cirrhosis: Most likely from  above  Plan: 1.  Ordered labs including CBC, CMP, PT/INR, AFP, hCG and HepBeAg and HepBeAb 2.  Ordered a right upper quadrant ultrasound 3.  Scheduled patient for an EGD for variceal screening with Dr. Havery Moros.  She requested a Friday and was scheduled into November.  Did provide the patient a detailed list of risks for the procedure and she agrees to proceed. 4.  Pending lab results from above patient would benefit from being started on Vemlidy versus Entecavir 5.  Did discuss patient's care with Dr. Havery Moros prior to her appointment.  She will continue to follow with him per recommendations after starting her viral treatment  Ellouise Newer, PA-C [facility] 03/21/2021, 3:48 PM  Cc: [place]

## 2021-03-22 LAB — HEPATITIS B E ANTIGEN: Hep B E Ag: NONREACTIVE

## 2021-03-22 LAB — AFP TUMOR MARKER: AFP-Tumor Marker: 10.4 ng/mL — ABNORMAL HIGH

## 2021-03-22 LAB — HEPATITIS B E ANTIBODY: Hep B E Ab: REACTIVE — AB

## 2021-03-22 NOTE — Progress Notes (Signed)
Agree with the note as outlined with the following thoughts: Patient has had chronic hep B for years looking back at old labs. No prior treatment. Now unfortunately with cirrhosis. This appears to be compensated at this time. She likely has cirrhosis from hep B but needs to have some labs done to rule out other causes as well (hep C, autoimmune, etc). Given her history of cirrhosis with viral load > 2000, and her risk for [facility], she needs to be treated for hepatitis B - recommend Entecavir or Vemlidy, likely will depend on insurance coverage, and will need to continue this indefinitely. Her children / spouse should also be tested for hepatitis B.  Anderson Malta can you please add the following: - lab for hepatitis C AB, ANA, IgG, smooth muscle antibody, TIBC / ferritin, ceruloplasmin, alpha one antitrypsin level, hepatitis A total AB (to assess immunity). Can you please ask her to go back to the lab for this additional blood work and relay results to me - when we have all of this back will need to start therapy for hep B, she needs to be counseled extensively on this and that she should not stop it after starting, or she could get quite sick. Maybe best to see me in the office in the next 2-3 weeks to discuss this and treatment initiation.  - her spouse / children should be tested for hepatitis B - no alcohol  Thanks

## 2021-03-23 ENCOUNTER — Other Ambulatory Visit: Payer: Self-pay

## 2021-03-23 DIAGNOSIS — K7460 Unspecified cirrhosis of liver: Secondary | ICD-10-CM

## 2021-03-23 DIAGNOSIS — B181 Chronic viral hepatitis B without delta-agent: Secondary | ICD-10-CM

## 2021-03-27 ENCOUNTER — Telehealth: Payer: Self-pay

## 2021-03-27 NOTE — Telephone Encounter (Signed)
-----   Message from Yevette Edwards, RN sent at 03/23/2021  2:09 PM EDT ----- Regarding: Labs Additional Hepatitis labs this week - orders in epic

## 2021-03-27 NOTE — Telephone Encounter (Signed)
Spoke with patient to remind her that she is due for additional labs at this time. No appointment is necessary. Patient is aware that she can stop by the lab in the basement at her convenience between 7:30 AM - 5 PM, Monday through Friday. Patient verbalized understanding and had no concerns at the end of the call.

## 2021-03-30 ENCOUNTER — Other Ambulatory Visit: Payer: Self-pay

## 2021-03-30 ENCOUNTER — Other Ambulatory Visit: Payer: 59

## 2021-03-30 ENCOUNTER — Ambulatory Visit (HOSPITAL_COMMUNITY)
Admission: RE | Admit: 2021-03-30 | Discharge: 2021-03-30 | Disposition: A | Discharge status: Home / Self Care | Payer: 59 | Source: Ambulatory Visit | Attending: Physician Assistant | Admitting: Physician Assistant

## 2021-03-30 DIAGNOSIS — B181 Chronic viral hepatitis B without delta-agent: Secondary | ICD-10-CM | POA: Diagnosis not present

## 2021-03-30 DIAGNOSIS — K7460 Unspecified cirrhosis of liver: Secondary | ICD-10-CM

## 2021-04-02 LAB — HEPATITIS C ANTIBODY
Hepatitis C Ab: NONREACTIVE
SIGNAL TO CUT-OFF: 0.01 (ref <1.00)

## 2021-04-02 LAB — IGG: IgG (Immunoglobin G), Serum: 1351 mg/dL (ref 600–1640)

## 2021-04-02 LAB — ANA: Anti Nuclear Antibody (ANA): NEGATIVE

## 2021-04-02 LAB — CERULOPLASMIN: Ceruloplasmin: 21 mg/dL (ref 18–53)

## 2021-04-02 LAB — IRON,TIBC AND FERRITIN PANEL
%SAT: 18 % (calc) (ref 16–45)
Ferritin: 43 ng/mL (ref 16–154)
Iron: 54 ug/dL (ref 40–190)
TIBC: 292 mcg/dL (calc) (ref 250–450)

## 2021-04-02 LAB — ANTI-SMOOTH MUSCLE ANTIBODY, IGG: Actin (Smooth Muscle) Antibody (IGG): <20 U (ref <20)

## 2021-04-02 LAB — HEPATITIS A ANTIBODY, TOTAL: Hepatitis A AB,Total: REACTIVE — AB

## 2021-04-02 LAB — ALPHA-1-ANTITRYPSIN: A-1 Antitrypsin, Ser: 144 mg/dL (ref 83–199)

## 2021-04-03 ENCOUNTER — Other Ambulatory Visit: Payer: Self-pay

## 2021-04-03 DIAGNOSIS — B181 Chronic viral hepatitis B without delta-agent: Secondary | ICD-10-CM

## 2021-04-03 DIAGNOSIS — K7460 Unspecified cirrhosis of liver: Secondary | ICD-10-CM

## 2021-04-03 NOTE — Progress Notes (Signed)
Entered additional las per Dr. Havery Moros.

## 2021-04-09 ENCOUNTER — Other Ambulatory Visit: Payer: 59

## 2021-04-09 DIAGNOSIS — K7460 Unspecified cirrhosis of liver: Secondary | ICD-10-CM

## 2021-04-09 DIAGNOSIS — B181 Chronic viral hepatitis B without delta-agent: Secondary | ICD-10-CM

## 2021-04-10 LAB — HEPATITIS B DNA, ULTRAQUANTITATIVE, PCR
Hepatitis B DNA (Calc): 8.14 Log IU/mL — ABNORMAL HIGH
Hepatitis B DNA: 139000000 IU/mL — ABNORMAL HIGH

## 2021-04-10 LAB — HIV ANTIBODY (ROUTINE TESTING W REFLEX): HIV 1&2 Ab, 4th Generation: NONREACTIVE

## 2021-04-11 ENCOUNTER — Ambulatory Visit (INDEPENDENT_AMBULATORY_CARE_PROVIDER_SITE_OTHER): Payer: 59 | Admitting: Gastroenterology

## 2021-04-11 ENCOUNTER — Encounter: Payer: Self-pay | Admitting: Gastroenterology

## 2021-04-11 ENCOUNTER — Telehealth: Payer: Self-pay

## 2021-04-11 VITALS — BP 123/76 | HR 59 | Ht 59.25 in | Wt 192.0 lb

## 2021-04-11 DIAGNOSIS — K7460 Unspecified cirrhosis of liver: Secondary | ICD-10-CM

## 2021-04-11 DIAGNOSIS — R772 Abnormality of alphafetoprotein: Secondary | ICD-10-CM | POA: Diagnosis not present

## 2021-04-11 DIAGNOSIS — B181 Chronic viral hepatitis B without delta-agent: Secondary | ICD-10-CM

## 2021-04-11 NOTE — Telephone Encounter (Signed)
Notified by [facility] that patient has been scheduled for an appointment on 05-09-21 at 2:15pm.

## 2021-04-11 NOTE — Telephone Encounter (Signed)
Referral form, OV note, labs and imaging faxed to [phone].  Phone Number: [phone]

## 2021-04-11 NOTE — Progress Notes (Signed)
 HPI :  40-year-old female here for a follow-up visit with me today for chronic hepatitis B and cirrhosis.  This is my first time meeting her, her first visit with us was September 14 with Jennifer [facility] where she had her initial evaluation.  Recall the patient has known she has had hepatitis B for several years, dating back to perhaps 2007 or so when she was pregnant with her first child.  She was recommended to have therapy at that time but states she did not do so because she did not want to take medication every day.  Unfortunately over time she has developed cirrhosis of the liver.  She had a CT scan in 2016 for kidney stones which showed a nodular hepatic contour suggesting cirrhosis as well as a small liver lesion.  In January of this year she had a CT angio of the chest to rule out PE which also showed cirrhosis.  She has had thrombocytopenia on review of labs as well as mild ALT and AST elevation to the 50s 60s.  Alk phos and bilirubin have been normal, INR elevated at 1.3 most recently.  She states she has never had any jaundice.  Denies any edema or symptoms of ascites.  She essentially states she feels well without any complaints at all.  She has no history of variceal bleeding or encephalopathy.  She takes omeprazole for some reflux and Pepcid as needed for breakthrough, otherwise denies any routine NSAID use.  She emphatically denies any alcohol use or tobacco use.  She is unaware of any family history of liver disease or cirrhosis.  She has 3 children, she states one of them was tested and negative but the other 2 have not been.  She is not currently married but currently has a sexual partner and actively trying to become pregnant.  She recently had a negative pregnancy test.  Her partner has not been tested for hepatitis B.  Since we have last seen her the patient has had extensive lab work and imaging as outlined below.  Ultrasound shows cirrhosis without mass lesions.  She is scheduled  for endoscopy with me in November to screen for varices.  We discussed her course today and treatment recommendations.  She again does not want to take any medications for this.  Recent labs Hep B DNA 139 million  IU/ml ------- previously 11,400 in May 2022 Hepatitis E AB (+) Hepatitis E AG (-) HIV negative Hepatitis C AB (-) ANA negative IgG 1351 Smooth muscle antibody negative Ferritin 43, iron sat 18% Alpha one antitrypsin 144 Hepatitis A total AB (+) Ceruloplasmin 21 AFP 10.4 INR 1.3 HCG negative AST 58, ALT 56, AP 45, T bil 0.7 Hgb 13.3, WBC 2.8, plt 94  RUQ US 03/31/21 - cirrhosis, no focal liver lesions   Past Medical History:  Diagnosis Date   Abnormal Pap smear    ascus with High Risk for HPV   Carpal tunnel syndrome    Cirrhosis (HCC)    De Quervain's tenosynovitis, left    Difficulty reading    Gestational diabetes    GDM with last pregnancy (2008)   History of gestational diabetes    History of hepatitis B 07/08/2005   History of malaria 07/08/2001   History of thrombocytopenia    Kidney stones    Obese    Plantar fasciitis      Past Surgical History:  Procedure Laterality Date   CESAREAN SECTION  2008   last pregnacy     CESAREAN SECTION  08/21/2011   Procedure: CESAREAN SECTION;  Surgeon: Tanya S Pratt, MD;  Location: [facility];  Service: Gynecology;  Laterality: N/A;   Family History  Problem Relation Age of Onset   Hypertension Mother    Stroke Mother    Social History   Tobacco Use   Smoking status: Never   Smokeless tobacco: Never  Vaping Use   Vaping Use: Never used  Substance Use Topics   Alcohol use: No   Drug use: No   Current Outpatient Medications  Medication Sig Dispense Refill   Alum & Mag Hydroxide-Simeth (ANTACID LIQUID PO) Take 30 mLs by mouth daily as needed.     cholecalciferol (VITAMIN D3) 25 MCG (1000 UNIT) tablet Take 1,000 Units by mouth daily.     ergocalciferol (VITAMIN D2) 1.25 MG (50000 UT) capsule Take 1 capsule  by mouth once a week.     famotidine (PEPCID) 20 MG tablet Take 20 mg by mouth daily as needed for heartburn or indigestion.     fluticasone (FLONASE) 50 MCG/ACT nasal spray Place 1 spray into both nostrils as needed for allergies or rhinitis.     naproxen (NAPROSYN) 500 MG tablet Take 1 tablet by mouth 2 (two) times daily as needed.     omeprazole (PRILOSEC) 20 MG capsule Take 1 capsule (20 mg total) by mouth 2 (two) times daily before a meal. 60 capsule 0   No current facility-administered medications for this visit.   Allergies  Allergen Reactions   Cephalexin Itching and Rash   Chloroquine Hives     Review of Systems: All systems reviewed and negative except where noted in HPI.    US Abdomen Limited RUQ (LIVER/GB)  Result Date: 03/31/2021 CLINICAL DATA:  Chronic hep B EXAM: ULTRASOUND ABDOMEN LIMITED RIGHT UPPER QUADRANT COMPARISON:  08/10/2019 FINDINGS: Gallbladder: No gallstones or wall thickening visualized. No sonographic Murphy sign noted by sonographer. Common bile duct: Diameter: 3 mm Liver: Coarse echogenic liver with suspected contour nodularity. No focal hepatic abnormality. Portal vein is patent on color Doppler imaging with normal direction of blood flow towards the liver. Other: None. IMPRESSION: 1. Coarse slightly echogenic liver with suspected contour nodularity concerning for cirrhosis. No focal hepatic mass lesion is seen. Electronically Signed   By: Kim  Fujinaga M.D.   On: 03/31/2021 23:14    Recent labs per HPI  Physical Exam: BP 123/76   Pulse (!) 59   Ht 4' 11.25" (1.505 m)   Wt 192 lb (87.1 kg)   BMI 38.45 kg/m  Constitutional: Pleasant,well-developed, female in no acute distress. HEENT: Normocephalic and atraumatic. Conjunctivae are normal. No scleral icterus. Neck supple.  Cardiovascular: Normal rate, regular rhythm.  Pulmonary/chest: Effort normal and breath sounds normal.  Abdominal: Soft, nondistended, nontender. There are no masses palpable.   Extremities: no edema Lymphadenopathy: No cervical adenopathy noted. Neurological: Alert and oriented to person place and time. Skin: Skin is warm and dry. No rashes noted. Psychiatric: Normal mood and affect. Behavior is normal.   ASSESSMENT AND PLAN: 40-year-old female here for reassessment following:  Cirrhosis Chronic hepatitis B Elevated AFP  Patient with chronic hepatitis B dating back over the years.  Apparently was offered therapy years ago but declined.  Unfortunately she has developed cirrhosis during this time.  She is currently compensated but at risk for decompensation and HCC moving forward.  We spent several minutes discussing presence of cirrhosis, she states was not aware she had this.  We discussed her hepatitis B, risks for   worsening cirrhosis and HCC with untreated hepatitis B.  She has an extremely high viral load, hepatitis E antigen negative, elevated ALT, with presence of cirrhosis at a young age, I strongly recommended hepatitis B therapy to her.  We discussed treatment options, ideally Entecavir or Vemlidy would be good long-term options.  We discussed that she would need to be compliant with therapy and take this indefinitely, due to risk for rebound increase in viral load and illness associated with that.  Following our initial discussion she is not interested in taking any long term medications to treat this.  We discussed that indefinite therapy is recommended given her cirrhosis.  Further complicating this is that she is actively trying to become pregnant.  I told her she would be considered to have a higher risk pregnancy and needs to be closely monitored for this.  Further, Entecavir and Vemlidy do not have much safety data with pregnancy, generally not recommended.  Given her desire to become pregnant which may limit some of her options, I am recommending she see Hepatology to discuss this further in regards to management of hepatitis B.  At the present time she is  declining medical therapy for her hepatitis B as she does not want to take pills on a routine basis.  We discussed at length that the risks of the medication are fairly low, while the risks of untreated hepatitis B are high and include increasing her risk for decompensation and HCC.  Further, if she is pregnant with this high viral load at risk for transmission to her child.  I did recommend that she have her children tested for hepatitis B as well as her sexual partner she states she will do so.  Finally, given her mildly elevated AFP and history of liver lesion noted on remote CT scan, recommending MRI of the liver to further evaluate and ensure no hepatoma.  She is scheduled for an EGD to screen for varices with me in November, hopefully she can see hepatology in the interim/near future.  She should establish with an OB/GYN if she is planning on becoming pregnant so they can counsel her, in light of her history of cirrhosis.  Plan: - refer to Hepatology - Dr. Zamor's / Dawn Drazek about treatment for hep B in light of patient trying to get pregnant. She is also declining therapy for now in general after discussion with me, as outlined above, despite risks of not treating hep B, and would like them to speak with her further about  - her children / sexual partner should be tested for hepatitis B. She actively is trying to get pregnant, needs to inform her partner about risks of hep B transmission - MRI liver with contrast - EGD scheduled for November to screen for varices. If she becomes pregnant in the interim would hold off on this - should establish with OB early prior to becoming pregnant to discuss effects of cirrhosis on pregnancy  I will see her back in the office for reassessment after her visit with Hepatology.  Steve Armbruster, MD [facility]  CC: [facility]  

## 2021-04-11 NOTE — Patient Instructions (Addendum)
If you are age 38 or older, your body mass index should be between 23-30. Your Body mass index is 38.45 kg/m. If this is out of the aforementioned range listed, please consider follow up with your Primary Care Provider.  If you are age 40 or younger, your body mass index should be between 19-25. Your Body mass index is 38.45 kg/m. If this is out of the aformentioned range listed, please consider follow up with your Primary Care Provider.   __________________________________________________________  The [facility] providers would like to encourage you to use [facility] to communicate with providers for non-urgent requests or questions.  Due to long hold times on the telephone, sending your provider a message by [facility] may be a faster and more efficient way to get a response.  Please allow 48 business hours for a response.  Please remember that this is for non-urgent requests.   You will be contacted by [facility] in the next 2 days to arrange a Liver MRI.  The number on your caller ID will be [phone], please answer when they call.  If you have not heard from them in 2 days please call [phone] to schedule.     We will refer you to the [facility], Dr. Zollie Scale or Roosevelt Locks, NP. They will contact you to schedule an appointment. Their number is [phone].   Please have your partner and children test for Hepatitis B as soon as possible.  Thank you for entrusting me with your care and for choosing [facility], Dr. Folsom Cellar

## 2021-04-19 ENCOUNTER — Encounter (HOSPITAL_COMMUNITY): Payer: Self-pay

## 2021-04-19 ENCOUNTER — Other Ambulatory Visit: Payer: Self-pay

## 2021-04-19 ENCOUNTER — Ambulatory Visit (HOSPITAL_COMMUNITY)
Admission: RE | Admit: 2021-04-19 | Discharge: 2021-04-19 | Disposition: A | Discharge status: Home / Self Care | Payer: 59 | Source: Ambulatory Visit | Attending: Gastroenterology | Admitting: Gastroenterology

## 2021-04-19 DIAGNOSIS — R772 Abnormality of alphafetoprotein: Secondary | ICD-10-CM

## 2021-04-19 DIAGNOSIS — K7460 Unspecified cirrhosis of liver: Secondary | ICD-10-CM

## 2021-04-19 DIAGNOSIS — B181 Chronic viral hepatitis B without delta-agent: Secondary | ICD-10-CM

## 2021-04-24 ENCOUNTER — Telehealth: Payer: Self-pay

## 2021-04-24 NOTE — Telephone Encounter (Signed)
-----   Message from Yetta Flock, MD sent at 04/24/2021  1:42 PM EDT ----- Got a notice this patient did not schedule MRI liver. Can you touch base with her and see if she is willing to do this? Thanks  ----- Message ----- From: SYSTEM Sent: 04/24/2021  12:14 AM EDT To: Yetta Flock, MD

## 2021-04-24 NOTE — Telephone Encounter (Signed)
Lm on vm for patient to return call 

## 2021-05-02 NOTE — Telephone Encounter (Signed)
Okay thanks for the follow up [name]. I think she is seeing Hepatology next week, they can discuss this with her. I will see her for her EGD. Thanks

## 2021-05-02 NOTE — Telephone Encounter (Signed)
Noted, thanks!

## 2021-05-02 NOTE — Telephone Encounter (Signed)
Called patient, she states that she does not want to proceed with the MRI that was ordered. Pt has an upcoming appt for EGD on 05/11/21 at 10 am.

## 2021-05-09 ENCOUNTER — Telehealth: Payer: Self-pay

## 2021-05-09 DIAGNOSIS — R772 Abnormality of alphafetoprotein: Secondary | ICD-10-CM

## 2021-05-09 DIAGNOSIS — B181 Chronic viral hepatitis B without delta-agent: Secondary | ICD-10-CM

## 2021-05-09 DIAGNOSIS — K7460 Unspecified cirrhosis of liver: Secondary | ICD-10-CM

## 2021-05-09 NOTE — Telephone Encounter (Signed)
-----   Message from Yetta Flock, MD sent at 05/09/2021  1:38 PM EDT ----- Regarding: FW: reminder - pregnancy test [facility] can you order a urine pregnancy test (qualitative only) to be done on Friday prior to her procedure? [facility] staff will call her tomorrow to make sure she goes on Friday. Thanks  ----- Message ----- From: Roetta Sessions, CMA Sent: 05/09/2021  12:53 PM EDT To: Yetta Flock, MD Subject: reminder - pregnancy test                      You wanted me to remind you to get a pregnancy test for this patient when she comes for EGD procedure on Friday.Marland KitchenMarland KitchenMarland Kitchen

## 2021-05-09 NOTE — Telephone Encounter (Signed)
Dr. Havery Moros OK with blood draw in lab for HCG test prior to procedure on Friday. [facility] will notify patient.

## 2021-05-09 NOTE — Telephone Encounter (Signed)
error 

## 2021-05-09 NOTE — Telephone Encounter (Signed)
Thank you for helping to coordinate! Yes just trying to get this result back before her exam on Friday. thanks

## 2021-05-09 NOTE — Telephone Encounter (Signed)
Dr. Havery Moros, happy to order. Are you needing the results back prior to patient's procedure or are you just wanting the sample collected prior? The lab in the basement does not do in-house testing, therefore the urine sample will need to be sent to an outside lab. Please advise, thanks.

## 2021-05-09 NOTE — Telephone Encounter (Signed)
Called pt, and let her know to come Thursday anytime between 8am-4pm to get labs drawn before procedure on 05/11/21. Pt verbalized understanding, and stated she will come for labs.

## 2021-05-10 ENCOUNTER — Other Ambulatory Visit (INDEPENDENT_AMBULATORY_CARE_PROVIDER_SITE_OTHER): Payer: 59

## 2021-05-10 DIAGNOSIS — K7460 Unspecified cirrhosis of liver: Secondary | ICD-10-CM | POA: Diagnosis not present

## 2021-05-10 DIAGNOSIS — B181 Chronic viral hepatitis B without delta-agent: Secondary | ICD-10-CM | POA: Diagnosis not present

## 2021-05-10 DIAGNOSIS — R772 Abnormality of alphafetoprotein: Secondary | ICD-10-CM | POA: Diagnosis not present

## 2021-05-10 LAB — HCG, QUANTITATIVE, PREGNANCY: Quantitative HCG: <0.6 m[IU]/mL

## 2021-05-10 NOTE — Telephone Encounter (Signed)
Noted, thanks!

## 2021-05-11 ENCOUNTER — Telehealth: Payer: Self-pay | Admitting: Gastroenterology

## 2021-05-11 ENCOUNTER — Other Ambulatory Visit: Payer: Self-pay

## 2021-05-11 ENCOUNTER — Encounter: Payer: Self-pay | Admitting: Gastroenterology

## 2021-05-11 ENCOUNTER — Ambulatory Visit (AMBULATORY_SURGERY_CENTER): Payer: 59 | Admitting: Gastroenterology

## 2021-05-11 VITALS — BP 117/76 | HR 55 | Temp 97.5°F | Resp 23 | Ht 59.0 in | Wt 192.0 lb

## 2021-05-11 DIAGNOSIS — K7460 Unspecified cirrhosis of liver: Secondary | ICD-10-CM | POA: Diagnosis present

## 2021-05-11 MED ORDER — DIAZEPAM 5 MG PO TABS: ORAL_TABLET | ORAL | 0 refills | Status: DC

## 2021-05-11 MED ORDER — DEXTROSE 5 % IV SOLN: INTRAVENOUS | Status: DC

## 2021-05-11 MED ORDER — SODIUM CHLORIDE 0.9 % IV SOLN: 500.0000 mL | Freq: Once | INTRAVENOUS | Status: DC

## 2021-05-11 NOTE — Progress Notes (Signed)
A and O x3. Report to RN. Tolerated MAC anesthesia well.Teeth unchanged after procedure. 

## 2021-05-11 NOTE — Telephone Encounter (Signed)
MRI liver order is in epic. Secure staff message sent to radiology schedulers to contact patient to set up her appt.  Valium prescription has been called into pharmacy on file, I had to leave a vm with the prescription because I got hung up on 4 times waiting for the pharmacy staff.   Lm on vm for patient to return call.

## 2021-05-11 NOTE — Patient Instructions (Signed)
Resume previous diet and medications.  Repeat upper endoscopy in 2 years for screening purposes.  Follow up with Hepatology for consultation and MRI liver as previously recommended.   YOU HAD AN ENDOSCOPIC PROCEDURE TODAY AT [facility]:   Refer to the procedure report that was given to you for any specific questions about what was found during the examination.  If the procedure report does not answer your questions, please call your gastroenterologist to clarify.  If you requested that your care partner not be given the details of your procedure findings, then the procedure report has been included in a sealed envelope for you to review at your convenience later.  YOU SHOULD EXPECT: Some feelings of bloating in the abdomen. Passage of more gas than usual.  Walking can help get rid of the air that was put into your GI tract during the procedure and reduce the bloating. If you had a lower endoscopy (such as a colonoscopy or flexible sigmoidoscopy) you may notice spotting of blood in your stool or on the toilet paper. If you underwent a bowel prep for your procedure, you may not have a normal bowel movement for a few days.  Please Note:  You might notice some irritation and congestion in your nose or some drainage.  This is from the oxygen used during your procedure.  There is no need for concern and it should clear up in a day or so.  SYMPTOMS TO REPORT IMMEDIATELY:  Following upper endoscopy (EGD)  Vomiting of blood or coffee ground material  New chest pain or pain under the shoulder blades  Painful or persistently difficult swallowing  New shortness of breath  Fever of 100F or higher  Black, tarry-looking stools  For urgent or emergent issues, a gastroenterologist can be reached at any hour by calling [phone]. Do not use MyChart messaging for urgent concerns.    DIET:  We do recommend a small meal at first, but then you may proceed to your regular diet.  Drink  plenty of fluids but you should avoid alcoholic beverages for 24 hours.  ACTIVITY:  You should plan to take it easy for the rest of today and you should NOT DRIVE or use heavy machinery until tomorrow (because of the sedation medicines used during the test).    FOLLOW UP: Our staff will call the number listed on your records 48-72 hours following your procedure to check on you and address any questions or concerns that you may have regarding the information given to you following your procedure. If we do not reach you, we will leave a message.  We will attempt to reach you two times.  During this call, we will ask if you have developed any symptoms of COVID 19. If you develop any symptoms (ie: fever, flu-like symptoms, shortness of breath, cough etc.) before then, please call [phone].  If you test positive for Covid 19 in the 2 weeks post procedure, please call and report this information to [place].    If any biopsies were taken you will be contacted by phone or by letter within the next 1-3 weeks.  Please call us at [phone] if you have not heard about the biopsies in 3 weeks.    SIGNATURES/CONFIDENTIALITY: You and/or your care partner have signed paperwork which will be entered into your electronic medical record.  These signatures attest to the fact that that the information above on your After Visit Summary has been reviewed and is understood.  Full responsibility of the confidentiality of this discharge information lies with you and/or your care-partner.  

## 2021-05-11 NOTE — Op Note (Signed)
[facility] Patient Name: Brenda Cannon Procedure Date: 05/11/2021 9:59 AM MRN: [id number] Endoscopist: Remo Lipps P. 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 The patient's tolerance of                            previous anesthesia was also reviewed. The risks                            and benefits of the procedure and the sedation                            options and risks were discussed with the patient.                            All questions were answered, and informed consent                            was obtained. Prior Anticoagulants: The patient has                            taken no previous anticoagulant or antiplatelet                            agents. ASA Grade Assessment: III - A patient with                            severe systemic disease. After reviewing the risks                            and benefits, the patient was deemed in                            satisfactory condition to undergo the procedure.  After obtaining informed consent, the endoscope was                            passed under direct vision. Throughout the                            procedure, the patient's blood pressure, pulse, and                            oxygen saturations were monitored continuously. The                            GIF [id number] #[id number] was introduced through the                            mouth, and  advanced to the second part of duodenum.                            The upper GI endoscopy was accomplished without                            difficulty. The patient tolerated the procedure                            well. Scope In: Scope Out: Findings:                 Esophagogastric landmarks were identified: the                            Z-line was found at 35 cm, the gastroesophageal                            junction was found at 35 cm and the upper extent of                            the gastric folds was found at 35 cm from the                            incisors.                           The exam of the esophagus was otherwise normal. No                            esophageal varices.                           The entire examined stomach was normal. No gastric                            varices.                           The duodenal bulb and second portion of the  duodenum were normal. Complications:            No immediate complications. Estimated blood loss:                            None. Estimated Blood Loss:     Estimated blood loss: none. Impression:               - Esophagogastric landmarks identified.                           - Normal esophagus otherwise - no varices.                           - Normal stomach. No varices.                           - Normal duodenal bulb and second portion of the                            duodenum. Recommendation:           - Patient has a contact number available for                            emergencies. The signs and symptoms of potential                            delayed complications were discussed with the                            patient. Return to normal activities tomorrow.                            Written discharge instructions were provided to the                            patient.                           - Resume previous diet.                           - Continue present  medications.                           - Repeat upper endoscopy in 2 years for screening                            purposes.                           - Follow up with Hepatology for consultation, and                            MRI liver as previously recommended Remo Lipps P. Jontavius Rabalais, MD 05/11/2021 10:29:27 AM This report has been signed electronically.

## 2021-05-11 NOTE — Progress Notes (Signed)
History reviewed 

## 2021-05-11 NOTE — Telephone Encounter (Signed)
Patient underwent EGD today, fortunately no varices. I spoke with her about liver MRI, she is agreeable to do it but may need a dose of valium 5mg  30 min prior to the exam to help her relax, if you can help coordinate. Otherwise, she is due to see Hepatology this month, will await their consultation and she will follow up with me afterwards.   [place] can you help coordinate MRI as above? Thanks

## 2021-05-11 NOTE — Progress Notes (Signed)
[facility] History and Physical   Primary Care Physician:  K[facility]   Reason for Procedure:   Cirrhosis - rule out varices  Plan:    EGD     HPI: Brenda Cannon is a 40 y.o. female  here for EGD to screen for esophageal varices. History of hep B cirrhosis, has declined therapy for hep B. Otherwise feels well without any cardiopulmonary symptoms today. No prior EGD. She had a negative pregnancy test yesterday afternoon.   Past Medical History:  Diagnosis Date   Abnormal Pap smear    ascus with High Risk for HPV   Carpal tunnel syndrome    Cirrhosis ([place])    De Quervain's tenosynovitis, left    Difficulty reading    Gestational diabetes    GDM with last pregnancy (2008)   History of gestational diabetes    History of hepatitis B 07/08/2005   History of malaria 07/08/2001   History of thrombocytopenia    Kidney stones    Obese    Plantar fasciitis     Past Surgical History:  Procedure Laterality Date   CESAREAN SECTION  2008   last pregnacy   CESAREAN SECTION  08/21/2011   Procedure: CESAREAN SECTION;  Surgeon: Donnamae Jude, MD;  Location: [facility];  Service: Gynecology;  Laterality: N/A;    Prior to Admission medications   Medication Sig Start Date End Date Taking? Authorizing Provider  Alum & Mag Hydroxide-Simeth (ANTACID LIQUID PO) Take 30 mLs by mouth daily as needed.    [provider]  cholecalciferol (VITAMIN D3) 25 MCG (1000 UNIT) tablet Take 1,000 Units by mouth daily.    [provider]  ergocalciferol (VITAMIN D2) 1.25 MG (50000 UT) capsule Take 1 capsule by mouth once a week.    [provider]  famotidine (PEPCID) 20 MG tablet Take 20 mg by mouth daily as needed for heartburn or indigestion.    [provider]  fluticasone (FLONASE) 50 MCG/ACT nasal spray Place 1 spray into both nostrils as needed for allergies or rhinitis.    [provider]  naproxen (NAPROSYN) 500 MG tablet Take 1  tablet by mouth 2 (two) times daily as needed.    [provider]  omeprazole (PRILOSEC) 20 MG capsule Take 1 capsule (20 mg total) by mouth 2 (two) times daily before a meal. 12/23/20 03/21/21  Barrie Folk, PA-C    Current Outpatient Medications  Medication Sig Dispense Refill   Alum & Mag Hydroxide-Simeth (ANTACID LIQUID PO) Take 30 mLs by mouth daily as needed.     cholecalciferol (VITAMIN D3) 25 MCG (1000 UNIT) tablet Take 1,000 Units by mouth daily.     ergocalciferol (VITAMIN D2) 1.25 MG (50000 UT) capsule Take 1 capsule by mouth once a week.     famotidine (PEPCID) 20 MG tablet Take 20 mg by mouth daily as needed for heartburn or indigestion.     fluticasone (FLONASE) 50 MCG/ACT nasal spray Place 1 spray into both nostrils as needed for allergies or rhinitis.     naproxen (NAPROSYN) 500 MG tablet Take 1 tablet by mouth 2 (two) times daily as needed.     omeprazole (PRILOSEC) 20 MG capsule Take 1 capsule (20 mg total) by mouth 2 (two) times daily before a meal. 60 capsule 0   Current Facility-Administered Medications  Medication Dose Route Frequency Provider Last Rate Last Admin   0.9 %  sodium chloride infusion  500 mL Intravenous Once Reather Steller, Carlota Raspberry,  MD       dextrose 5 % solution   Intravenous Continuous Jolyssa Oplinger, Carlota Raspberry, MD        Allergies as of 05/11/2021 - Review Complete 05/11/2021  Allergen Reaction Noted   Cephalexin Itching and Rash 03/25/2011   Chloroquine Hives 06/28/2012    Family History  Problem Relation Age of Onset   Hypertension Mother    Stroke Mother    Colon cancer Neg Hx    Esophageal cancer Neg Hx    Liver cancer Neg Hx     Social History   Socioeconomic History   Marital status: Single    Spouse name: Not on file   Number of children: 3   Years of education: Not on file   Highest education level: Not on file  Occupational History   Occupation: Holiday representative  Tobacco Use   Smoking status: Never   Smokeless  tobacco: Never  Vaping Use   Vaping Use: Never used  Substance and Sexual Activity   Alcohol use: No   Drug use: No   Sexual activity: Not Currently    Birth control/protection: None  Other Topics Concern   Not on file  Social History Narrative   ** Merged History Encounter **       Social Determinants of Health   Financial Resource Strain: Not on file  Food Insecurity: Not on file  Transportation Needs: Not on file  Physical Activity: Not on file  Stress: Not on file  Social Connections: Not on file  Intimate Partner Violence: Not on file    Review of Systems: All other review of systems negative except as mentioned in the HPI.  Physical Exam: Vital signs BP 122/71   Pulse 70   Temp (!) 97.5 F (36.4 C) (Temporal)   Resp 19   Ht 4\' 11"  (1.499 m)   Wt 192 lb (87.1 kg)   LMP 04/20/2021 Comment: pregnancy test yesterday-negative  SpO2 99%   BMI 38.78 kg/m   General:   Alert,  Well-developed, pleasant and cooperative in NAD Lungs:  Clear throughout to auscultation.   Heart:  Regular rate and rhythm Abdomen:  Soft, nontender and nondistended.   Neuro/Psych:  Alert and cooperative. Normal mood and affect. A and O x 3  Jolly Mango, MD [facility]

## 2021-05-14 NOTE — Telephone Encounter (Signed)
Lm on mobile vm for patient to return call °

## 2021-05-15 ENCOUNTER — Telehealth: Payer: Self-pay

## 2021-05-15 NOTE — Telephone Encounter (Signed)
Attempted to reach patient on her home phone. The phone line rings and then goes straight to a busy signal. Unable to leave a vm at this time.

## 2021-05-15 NOTE — Telephone Encounter (Signed)
Left message

## 2021-05-15 NOTE — Telephone Encounter (Signed)
Left message on follow up call. 

## 2021-05-17 NOTE — Telephone Encounter (Signed)
No return call received. Letter mailed to patient letting her know that we have been unable to reach her and she can reach out to radiology scheduling at her convenience to set up her MRI liver appt.

## 2021-06-23 ENCOUNTER — Other Ambulatory Visit: Payer: Self-pay

## 2021-06-23 ENCOUNTER — Emergency Department (HOSPITAL_COMMUNITY)
Admission: EM | Admit: 2021-06-23 | Discharge: 2021-06-23 | Disposition: A | Discharge status: Home / Self Care | Payer: 59 | Attending: Emergency Medicine | Admitting: Emergency Medicine

## 2021-06-23 ENCOUNTER — Emergency Department (HOSPITAL_COMMUNITY): Payer: 59

## 2021-06-23 DIAGNOSIS — N939 Abnormal uterine and vaginal bleeding, unspecified: Secondary | ICD-10-CM | POA: Diagnosis present

## 2021-06-23 DIAGNOSIS — E119 Type 2 diabetes mellitus without complications: Secondary | ICD-10-CM | POA: Diagnosis not present

## 2021-06-23 DIAGNOSIS — N9489 Other specified conditions associated with female genital organs and menstrual cycle: Secondary | ICD-10-CM | POA: Insufficient documentation

## 2021-06-23 LAB — URINALYSIS, ROUTINE W REFLEX MICROSCOPIC
Bilirubin Urine: NEGATIVE
Glucose, UA: NEGATIVE mg/dL
Ketones, ur: NEGATIVE mg/dL
Leukocytes,Ua: NEGATIVE
Nitrite: NEGATIVE
Protein, ur: NEGATIVE mg/dL
Specific Gravity, Urine: 1.006 (ref 1.005–1.030)
pH: 6 (ref 5.0–8.0)

## 2021-06-23 LAB — CBC WITH DIFFERENTIAL/PLATELET
Abs Immature Granulocytes: 0.01 10*3/uL (ref 0.00–0.07)
Basophils Absolute: 0 10*3/uL (ref 0.0–0.1)
Basophils Relative: 0 %
Eosinophils Absolute: 0.1 10*3/uL (ref 0.0–0.5)
Eosinophils Relative: 4 %
HCT: 36.4 % (ref 36.0–46.0)
Hemoglobin: 11.8 g/dL — ABNORMAL LOW (ref 12.0–15.0)
Immature Granulocytes: 0 %
Lymphocytes Relative: 50 %
Lymphs Abs: 1.3 10*3/uL (ref 0.7–4.0)
MCH: 30.1 pg (ref 26.0–34.0)
MCHC: 32.4 g/dL (ref 30.0–36.0)
MCV: 92.9 fL (ref 80.0–100.0)
Monocytes Absolute: 0.3 10*3/uL (ref 0.1–1.0)
Monocytes Relative: 11 %
Neutro Abs: 0.9 10*3/uL — ABNORMAL LOW (ref 1.7–7.7)
Neutrophils Relative %: 35 %
Platelets: 100 10*3/uL — ABNORMAL LOW (ref 150–400)
RBC: 3.92 MIL/uL (ref 3.87–5.11)
RDW: 13.6 % (ref 11.5–15.5)
WBC: 2.6 10*3/uL — ABNORMAL LOW (ref 4.0–10.5)
nRBC: 0 % (ref 0.0–0.2)

## 2021-06-23 LAB — COMPREHENSIVE METABOLIC PANEL
ALT: 28 U/L (ref 0–44)
AST: 33 U/L (ref 15–41)
Albumin: 3.2 g/dL — ABNORMAL LOW (ref 3.5–5.0)
Alkaline Phosphatase: 55 U/L (ref 38–126)
Anion gap: 5 (ref 5–15)
BUN: 12 mg/dL (ref 6–20)
CO2: 25 mmol/L (ref 22–32)
Calcium: 8.2 mg/dL — ABNORMAL LOW (ref 8.9–10.3)
Chloride: 107 mmol/L (ref 98–111)
Creatinine, Ser: 0.66 mg/dL (ref 0.44–1.00)
GFR, Estimated: >60 mL/min (ref >60)
Glucose, Bld: 118 mg/dL — ABNORMAL HIGH (ref 70–99)
Potassium: 3.3 mmol/L — ABNORMAL LOW (ref 3.5–5.1)
Sodium: 137 mmol/L (ref 135–145)
Total Bilirubin: 0.7 mg/dL (ref 0.3–1.2)
Total Protein: 6.4 g/dL — ABNORMAL LOW (ref 6.5–8.1)

## 2021-06-23 LAB — I-STAT BETA HCG BLOOD, ED (MC, WL, AP ONLY): I-stat hCG, quantitative: 17.9 m[IU]/mL — ABNORMAL HIGH (ref <5)

## 2021-06-23 LAB — LIPASE, BLOOD: Lipase: 65 U/L — ABNORMAL HIGH (ref 11–51)

## 2021-06-23 LAB — HCG, QUANTITATIVE, PREGNANCY: hCG, Beta Chain, Quant, S: 22 m[IU]/mL — ABNORMAL HIGH (ref <5)

## 2021-06-23 NOTE — ED Triage Notes (Signed)
Patient reports her period has been on for two weeks and now abdomen is hurting. Patient denies pain in triage.

## 2021-06-23 NOTE — ED Provider Notes (Signed)
[facility] DEPT Provider Note   CSN: [id number] Arrival date & time: 06/23/21  4782     History Chief Complaint  Patient presents with   Abdominal Pain   Vaginal Bleeding    Brenda Cannon is a 40 y.o. female who presents to the ED complaining of intermittent vaginal bleeding x2 weeks.  Patient reports that she has had intermittent clots noted with her menstrual cycle started 2 weeks ago. She reports that she has been on her menstrual cycle for 2 weeks, which is abnormal for her. She typically has regular menstrual cycles.  Patient is sexually active with 1 female partner with unprotected intercourse.  Denies concerns for STI.  Patient has associated symptoms of lower abdominal pain x2 weeks.  She has not tried any medications for her symptoms.  Patient denies fever, chills, nausea, vomiting, vaginal discharge, dysuria, hematuria.     The history is provided by the patient. No language interpreter was used.      Past Medical History:  Diagnosis Date   Abnormal Pap smear    ascus with High Risk for HPV   Carpal tunnel syndrome    Cirrhosis ([place])    De Quervain's tenosynovitis, left    Difficulty reading    Gestational diabetes    GDM with last pregnancy (2008)   History of gestational diabetes    History of hepatitis B 07/08/2005   History of malaria 07/08/2001   History of thrombocytopenia    Kidney stones    Obese    Plantar fasciitis     Patient Active Problem List   Diagnosis Date Noted   Papanicolaou smear of cervix with positive high risk human papilloma virus (HPV) test 11/08/2020   Carpal tunnel syndrome, bilateral 05/31/2020   Diabetes mellitus type 2 in obese ([place]) 03/24/2015   Metatarsal stress fracture of left foot 11/10/2013   Allergic rhinitis 05/26/2012   Carpal tunnel syndrome of left wrist 04/15/2011   History of malaria 03/04/2011   Chronic hepatitis B ([place]) 03/01/2011   H/O: C-section 03/01/2011   History of  thrombocytopenia 03/01/2011    Past Surgical History:  Procedure Laterality Date   CESAREAN SECTION  2008   last pregnacy   CESAREAN SECTION  08/21/2011   Procedure: CESAREAN SECTION;  Surgeon: Donnamae Jude, MD;  Location: [facility];  Service: Gynecology;  Laterality: N/A;     OB History     Gravida  5   Para  3   Term  3   Preterm  0   AB  2   Living  3      SAB  1   IAB      Ectopic  0   Multiple  0   Live Births  3           Family History  Problem Relation Age of Onset   Hypertension Mother    Stroke Mother    Colon cancer Neg Hx    Esophageal cancer Neg Hx    Liver cancer Neg Hx     Social History   Tobacco Use   Smoking status: Never   Smokeless tobacco: Never  Vaping Use   Vaping Use: Never used  Substance Use Topics   Alcohol use: No   Drug use: No    Home Medications Prior to Admission medications   Medication Sig Start Date End Date Taking? Authorizing Provider  Alum & Mag Hydroxide-Simeth (ANTACID LIQUID PO) Take 30 mLs by mouth daily  as needed (indigestion).   Yes [provider]  cholecalciferol (VITAMIN D3) 25 MCG (1000 UNIT) tablet Take 1,000 Units by mouth daily.   Yes [provider]  diazepam (VALIUM) 5 MG tablet Take 1 tablet (5 mg total) by mouth 30 minutes prior to MRI appt. 05/11/21  Yes Armbruster, Carlota Raspberry, MD  ergocalciferol (VITAMIN D2) 1.25 MG (50000 UT) capsule Take 1 capsule by mouth once a week.   Yes [provider]  famotidine (PEPCID) 20 MG tablet Take 20 mg by mouth daily as needed for heartburn or indigestion.   Yes [provider]  fluticasone (FLONASE) 50 MCG/ACT nasal spray Place 1 spray into both nostrils as needed for allergies or rhinitis.   Yes [provider]  naproxen (NAPROSYN) 500 MG tablet Take 1 tablet by mouth 2 (two) times daily as needed for moderate pain.   Yes [provider]  Prenatal Vit-Fe Fumarate-FA (MULTIVITAMIN-PRENATAL) 27-0.8 MG TABS  tablet Take 1 tablet by mouth daily at 12 noon.   Yes [provider]  omeprazole (PRILOSEC) 20 MG capsule Take 1 capsule (20 mg total) by mouth 2 (two) times daily before a meal. 12/23/20 03/21/21  Walisiewicz, Verline Lema E, PA-C    Allergies    Cephalexin and Chloroquine  Review of Systems   Review of Systems  Constitutional:  Negative for chills and fever.  Gastrointestinal:  Positive for abdominal pain. Negative for nausea and vomiting.  Genitourinary:  Positive for frequency and vaginal bleeding. Negative for dysuria, urgency and vaginal discharge.  Skin:  Negative for rash.  All other systems reviewed and are negative.  Physical Exam Updated Vital Signs BP 124/64    Pulse 67    Temp 98.7 F (37.1 C) (Oral)    Resp 17    Ht 5\' 1"  (1.549 m)    Wt 83.9 kg    LMP 06/23/2021    SpO2 100%    BMI 34.96 kg/m   Physical Exam Vitals and nursing note reviewed.  Constitutional:      General: She is not in acute distress.    Appearance: She is not diaphoretic.  HENT:     Head: Normocephalic and atraumatic.     Mouth/Throat:     Pharynx: No oropharyngeal exudate.  Eyes:     General: No scleral icterus.    Conjunctiva/sclera: Conjunctivae normal.  Cardiovascular:     Rate and Rhythm: Normal rate and regular rhythm.     Pulses: Normal pulses.     Heart sounds: Normal heart sounds.  Pulmonary:     Effort: Pulmonary effort is normal. No respiratory distress.     Breath sounds: Normal breath sounds. No wheezing.  Abdominal:     General: Bowel sounds are normal.     Palpations: Abdomen is soft. There is no mass.     Tenderness: There is abdominal tenderness in the right lower quadrant, suprapubic area and left lower quadrant. There is no guarding or rebound.     Comments: Mild lower abdominal tenderness to palpation.  No overlying skin changes.  Musculoskeletal:        General: Normal range of motion.     Cervical back: Normal range of motion and neck supple.  Skin:    General:  Skin is warm and dry.  Neurological:     Mental Status: She is alert.  Psychiatric:        Behavior: Behavior normal.    ED Results / Procedures / Treatments   Labs (all labs  ordered are listed, but only abnormal results are displayed) Labs Reviewed  CBC WITH DIFFERENTIAL/PLATELET - Abnormal; Notable for the following components:      Result Value   WBC 2.6 (*)    Hemoglobin 11.8 (*)    Platelets 100 (*)    Neutro Abs 0.9 (*)    All other components within normal limits  COMPREHENSIVE METABOLIC PANEL - Abnormal; Notable for the following components:   Potassium 3.3 (*)    Glucose, Bld 118 (*)    Calcium 8.2 (*)    Total Protein 6.4 (*)    Albumin 3.2 (*)    All other components within normal limits  LIPASE, BLOOD - Abnormal; Notable for the following components:   Lipase 65 (*)    All other components within normal limits  URINALYSIS, ROUTINE W REFLEX MICROSCOPIC - Abnormal; Notable for the following components:   Color, Urine STRAW (*)    Hgb urine dipstick SMALL (*)    Bacteria, UA RARE (*)    All other components within normal limits  HCG, QUANTITATIVE, PREGNANCY - Abnormal; Notable for the following components:   hCG, Beta Chain, Quant, S 22 (*)    All other components within normal limits  I-STAT BETA HCG BLOOD, ED ([facility], [facility], [facility] ONLY) - Abnormal; Notable for the following components:   I-stat hCG, quantitative 17.9 (*)    All other components within normal limits    EKG None  Radiology US OB Comp < 14 Wks  Result Date: 06/23/2021 CLINICAL DATA:  Pregnant patient.  Bleeding. EXAM: OBSTETRIC <14 WK [place] AND TRANSVAGINAL OB US TECHNIQUE: Both transabdominal and transvaginal ultrasound examinations were performed for complete evaluation of the gestation as well as the maternal uterus, adnexal regions, and pelvic cul-de-sac. Transvaginal technique was performed to assess early pregnancy. COMPARISON:  None. FINDINGS: Intrauterine gestational sac: None Maternal  uterus/adnexae: The ovaries are normal in appearance. 2.1 cm fibroid in the uterus. No free fluid in the pelvis. IMPRESSION: 1. No IUP is identified. In a pregnant patient, the lack of an IUP could represent ectopic pregnancy, early pregnancy, or recent miscarriage. Recommend close clinical follow-up and follow-up imaging as clinically warranted. 2. 2.1 cm fibroid in the uterus. 3. No other abnormalities. Electronically Signed   By: Dorise Bullion III M.D.   On: 06/23/2021 13:30   US OB Transvaginal  Result Date: 06/23/2021 CLINICAL DATA:  Pregnant patient.  Bleeding. EXAM: OBSTETRIC <14 WK [place] AND TRANSVAGINAL OB US TECHNIQUE: Both transabdominal and transvaginal ultrasound examinations were performed for complete evaluation of the gestation as well as the maternal uterus, adnexal regions, and pelvic cul-de-sac. Transvaginal technique was performed to assess early pregnancy. COMPARISON:  None. FINDINGS: Intrauterine gestational sac: None Maternal uterus/adnexae: The ovaries are normal in appearance. 2.1 cm fibroid in the uterus. No free fluid in the pelvis. IMPRESSION: 1. No IUP is identified. In a pregnant patient, the lack of an IUP could represent ectopic pregnancy, early pregnancy, or recent miscarriage. Recommend close clinical follow-up and follow-up imaging as clinically warranted. 2. 2.1 cm fibroid in the uterus. 3. No other abnormalities. Electronically Signed   By: Dorise Bullion III M.D.   On: 06/23/2021 13:30    Procedures Procedures   Medications Ordered in ED Medications - No data to display  ED Course  I have reviewed the triage vital signs and the nursing notes.  Pertinent labs & imaging results that were available during my care of the patient were reviewed by me and considered in my  medical decision making (see chart for details).  Clinical Course as of 06/23/21 1455  Sat Jun 23, 2021  1043 Case discussed with attending, who agrees with treatment plan of repeating hcg and  ultrasound [SB]  1134 Pt re-evaluated and noted to be sleeping comfortably. Discussed with patient lab findings and [place] that is ordered. [SB]  4098 Patient resting comfortably on stretcher.  Patient fianc at bedside.  Discussed ultrasound and quantitative hCG findings with patient.  Discussed discharge treatment plan with patient.  Patient agreeable at this time.  Patient appears safe for discharge at this time. [SB]    Clinical Course User Index [SB] Jerica Creegan A, PA-C   MDM Rules/Calculators/A&P                         Patient presents to the ED with lower abdominal pain and vaginal bleeding x2 weeks.  She reports her period is typically regular and she has been on her current cycle for 2 weeks which is irregular for her.  Patient also reports clots with this menstrual cycle. Patient is sexually active with 1 female partner with unprotected intercourse.  Patient denies concerns for STI at this time.  Vital signs stable, patient afebrile, oxygen saturation at 100%.  On exam patient with lower abdominal tenderness to palpation.  Otherwise no acute cardiovascular, respiratory exam findings.  Differential diagnosis includes ectopic pregnancy, miscarriage, abnormal uterine bleeding. CMP, CBC, lipase stable and unremarkable.  Urinalysis without acute signs or infection.  I-STAT beta-hCG elevated at 17.9.  hCG quantitative elevated at 22.  Transvaginal ultrasound with no IUP identified.  Patient presentation suspicious for abnormal uterine bleeding versus miscarriage.   Discussed lab and imaging findings with patient and fianc at bedside.  Answered all questions at bedside. Stressed importance with patient and fianc regarding following up with her gynecologist on Monday for repeat hCG and further evaluation of her symptoms.  Patient agreeable to discharge treatment plan.  Supportive care measures and strict return precautions discussed with patient at bedside.  Patient appears safe for discharge at this  time.  Follow-up as indicated in discharge paperwork.   Final Clinical Impression(s) / ED Diagnoses Final diagnoses:  Vaginal bleeding    Rx / DC Orders ED Discharge Orders     None        Deya Bigos A, PA-C 06/23/21 1455    Valarie Merino, MD 06/23/21 1458

## 2021-06-23 NOTE — Discharge Instructions (Addendum)
Call and set up your appointment with your Gynecologist for follow up appointment for your vaginal bleeding, your hCG lab that was at 71, and ultrasound that did not show intrauterine pregnancy.   You may follow-up with your primary care provider as needed.  Return to the ED if you have increasing/worsening abdominal pain, vaginal bleeding, fever, or worsening symptoms.

## 2021-09-03 ENCOUNTER — Other Ambulatory Visit: Payer: Self-pay | Admitting: Nurse Practitioner

## 2021-09-03 DIAGNOSIS — K7469 Other cirrhosis of liver: Secondary | ICD-10-CM

## 2021-09-10 ENCOUNTER — Other Ambulatory Visit: Payer: 59

## 2021-09-17 ENCOUNTER — Other Ambulatory Visit: Payer: Self-pay

## 2021-09-17 ENCOUNTER — Telehealth: Payer: Self-pay

## 2021-09-17 DIAGNOSIS — B181 Chronic viral hepatitis B without delta-agent: Secondary | ICD-10-CM

## 2021-09-17 DIAGNOSIS — K7460 Unspecified cirrhosis of liver: Secondary | ICD-10-CM

## 2021-09-17 NOTE — Telephone Encounter (Signed)
Patient due for RUQ U/S around 3-23.  Saw  Dawn Drazek on 2-27. Patient scheduled at [facility] on Monday, 3-27 at 9:00am.  To arri at 8:45am and NPO 6 hours. Called and spoke to patient. She confirmed appointment date and time ? ?

## 2021-09-17 NOTE — Telephone Encounter (Signed)
-----   Message from Roetta Sessions, [place] sent at 04/02/2021 12:15 PM EDT ----- ?Regarding: due for RUQ ultrasound ?Due for RUQ ultrasound in mid March for chronic Hep B ? ?

## 2021-10-01 ENCOUNTER — Ambulatory Visit (HOSPITAL_COMMUNITY): Admission: RE | Admit: 2021-10-01 | Payer: BC Managed Care – PPO | Source: Ambulatory Visit

## 2021-11-29 ENCOUNTER — Other Ambulatory Visit: Payer: Self-pay

## 2021-11-29 ENCOUNTER — Emergency Department (HOSPITAL_COMMUNITY): Payer: BC Managed Care – PPO

## 2021-11-29 ENCOUNTER — Encounter (HOSPITAL_COMMUNITY): Payer: Self-pay

## 2021-11-29 ENCOUNTER — Emergency Department (HOSPITAL_COMMUNITY)
Admission: EM | Admit: 2021-11-29 | Discharge: 2021-11-29 | Disposition: A | Discharge status: Home / Self Care | Payer: BC Managed Care – PPO | Attending: Emergency Medicine | Admitting: Emergency Medicine

## 2021-11-29 DIAGNOSIS — R519 Headache, unspecified: Secondary | ICD-10-CM | POA: Diagnosis present

## 2021-11-29 DIAGNOSIS — R079 Chest pain, unspecified: Secondary | ICD-10-CM | POA: Diagnosis not present

## 2021-11-29 DIAGNOSIS — F430 Acute stress reaction: Secondary | ICD-10-CM | POA: Insufficient documentation

## 2021-11-29 LAB — BASIC METABOLIC PANEL
Anion gap: 4 — ABNORMAL LOW (ref 5–15)
BUN: 20 mg/dL (ref 6–20)
CO2: 26 mmol/L (ref 22–32)
Calcium: 8.9 mg/dL (ref 8.9–10.3)
Chloride: 108 mmol/L (ref 98–111)
Creatinine, Ser: 0.91 mg/dL (ref 0.44–1.00)
GFR, Estimated: >60 mL/min (ref >60)
Glucose, Bld: 106 mg/dL — ABNORMAL HIGH (ref 70–99)
Potassium: 3.6 mmol/L (ref 3.5–5.1)
Sodium: 138 mmol/L (ref 135–145)

## 2021-11-29 LAB — CBC WITH DIFFERENTIAL/PLATELET
Abs Immature Granulocytes: 0.01 10*3/uL (ref 0.00–0.07)
Basophils Absolute: 0 10*3/uL (ref 0.0–0.1)
Basophils Relative: 0 %
Eosinophils Absolute: 0.1 10*3/uL (ref 0.0–0.5)
Eosinophils Relative: 2 %
HCT: 38.2 % (ref 36.0–46.0)
Hemoglobin: 12.6 g/dL (ref 12.0–15.0)
Immature Granulocytes: 0 %
Lymphocytes Relative: 44 %
Lymphs Abs: 1.4 10*3/uL (ref 0.7–4.0)
MCH: 30.4 pg (ref 26.0–34.0)
MCHC: 33 g/dL (ref 30.0–36.0)
MCV: 92.3 fL (ref 80.0–100.0)
Monocytes Absolute: 0.3 10*3/uL (ref 0.1–1.0)
Monocytes Relative: 8 %
Neutro Abs: 1.5 10*3/uL — ABNORMAL LOW (ref 1.7–7.7)
Neutrophils Relative %: 46 %
Platelets: 105 10*3/uL — ABNORMAL LOW (ref 150–400)
RBC: 4.14 MIL/uL (ref 3.87–5.11)
RDW: 12.9 % (ref 11.5–15.5)
WBC: 3.3 10*3/uL — ABNORMAL LOW (ref 4.0–10.5)
nRBC: 1.8 % — ABNORMAL HIGH (ref 0.0–0.2)

## 2021-11-29 LAB — TROPONIN I (HIGH SENSITIVITY): Troponin I (High Sensitivity): 3 ng/L (ref <18)

## 2021-11-29 LAB — I-STAT BETA HCG BLOOD, ED (MC, WL, AP ONLY): I-stat hCG, quantitative: <5 m[IU]/mL (ref <5)

## 2021-11-29 MED ORDER — DIPHENHYDRAMINE HCL 50 MG/ML IJ SOLN
12.5000 mg | Freq: Once | INTRAMUSCULAR | Status: AC
Start: 2021-11-29 — End: 2021-11-29
  Administered 2021-11-29: 12.5 mg via INTRAVENOUS
  Filled 2021-11-29: qty 1

## 2021-11-29 MED ORDER — SODIUM CHLORIDE 0.9 % IV BOLUS
1000.0000 mL | Freq: Once | INTRAVENOUS | Status: AC
  Administered 2021-11-29: 1000 mL via INTRAVENOUS

## 2021-11-29 MED ORDER — METOCLOPRAMIDE HCL 5 MG/ML IJ SOLN
10.0000 mg | INTRAMUSCULAR | Status: AC
  Administered 2021-11-29: 10 mg via INTRAVENOUS
  Filled 2021-11-29: qty 2

## 2021-11-29 NOTE — Discharge Instructions (Signed)
Can continue tylenol or motrin as needed for headache. Follow-up with your primary care doctor. Return to the ED for new or worsening symptoms.

## 2021-11-29 NOTE — ED Triage Notes (Addendum)
Pt states that she has had a headache since Sunday that is radiating to her neck. Pt states that she is now starting to have central chest pain as well. Pt reports taking Tylenol an hr ago.

## 2021-11-29 NOTE — ED Provider Notes (Signed)
[facility] DEPT Provider Note   CSN: [id number] Arrival date & time: 11/29/21  0344     History  Chief Complaint  Patient presents with   Chest Pain   Headache    Brenda Cannon is a 41 y.o. female.  The history is provided by the patient and medical records.  Chest Pain Associated symptoms: headache   Headache  42 y.o. F presenting to the ED with headache.  Reports ongoing since Sunday.  Described as pain running in a band like fashion around her head, pain is also throbbing in nature.  She has been taking tylenol intermittently which does help some but headache has never fully resolved.  She reports headaches in the past but never persisting this long.  She denies any numbness, weakness, blurred vision, dizziness, confusion, changes in speech.  She is not on anticoagulation.  No falls or head trauma.  States tonight at work she also started having some mild central chest pain.  Does not feel this currently.  She has no known cardiac history.  Last Tylenol taken around 8 PM.  Does report some increased stress at work recently.  Home Medications Prior to Admission medications   Medication Sig Start Date End Date Taking? Authorizing Provider  Alum & Mag Hydroxide-Simeth (ANTACID LIQUID PO) Take 30 mLs by mouth daily as needed (indigestion).    [provider]  cholecalciferol (VITAMIN D3) 25 MCG (1000 UNIT) tablet Take 1,000 Units by mouth daily.    [provider]  diazepam (VALIUM) 5 MG tablet Take 1 tablet (5 mg total) by mouth 30 minutes prior to MRI appt. 05/11/21   Armbruster, Carlota Raspberry, MD  ergocalciferol (VITAMIN D2) 1.25 MG (50000 UT) capsule Take 1 capsule by mouth once a week.    [provider]  famotidine (PEPCID) 20 MG tablet Take 20 mg by mouth daily as needed for heartburn or indigestion.    [provider]  fluticasone (FLONASE) 50 MCG/ACT nasal spray Place 1 spray into both nostrils as needed for  allergies or rhinitis.    [provider]  naproxen (NAPROSYN) 500 MG tablet Take 1 tablet by mouth 2 (two) times daily as needed for moderate pain.    [provider]  omeprazole (PRILOSEC) 20 MG capsule Take 1 capsule (20 mg total) by mouth 2 (two) times daily before a meal. 12/23/20 03/21/21  Walisiewicz, Harley Hallmark, PA-C  Prenatal Vit-Fe Fumarate-FA (MULTIVITAMIN-PRENATAL) 27-0.8 MG TABS tablet Take 1 tablet by mouth daily at 12 noon.    [provider]      Allergies    Cephalexin and Chloroquine    Review of Systems   Review of Systems  Cardiovascular:  Positive for chest pain.  Neurological:  Positive for headaches.  All other systems reviewed and are negative.  Physical Exam Updated Vital Signs BP (!) 145/102 (BP Location: Left Arm)   Pulse 70   Temp 97.9 F (36.6 C) (Oral)   Resp 16   Ht '5\' 1"'$  (1.549 m)   Wt 79.4 kg   SpO2 100%   BMI 33.07 kg/m   Physical Exam Vitals and nursing note reviewed.  Constitutional:      General: She is not in acute distress.    Appearance: She is well-developed. She is not diaphoretic.  HENT:     Head: Normocephalic and atraumatic.     Right Ear: External ear normal.     Left Ear: External ear normal.  Eyes:  Conjunctiva/sclera: Conjunctivae normal.     Pupils: Pupils are equal, round, and reactive to light.  Neck:     Comments: No rigidity, no meningismus Cardiovascular:     Rate and Rhythm: Normal rate and regular rhythm.     Heart sounds: Normal heart sounds. No murmur heard. Pulmonary:     Effort: Pulmonary effort is normal. No respiratory distress.     Breath sounds: Normal breath sounds. No wheezing or rhonchi.  Abdominal:     General: Bowel sounds are normal.     Palpations: Abdomen is soft.     Tenderness: There is no abdominal tenderness. There is no guarding.  Musculoskeletal:        General: Normal range of motion.     Cervical back: Full passive range of motion without pain, normal range  of motion and neck supple. No rigidity.  Skin:    General: Skin is warm and dry.     Findings: No rash.  Neurological:     Mental Status: She is alert and oriented to person, place, and time.     Cranial Nerves: No cranial nerve deficit.     Sensory: No sensory deficit.     Motor: No tremor or seizure activity.     Comments: AAOx3, answering questions and following commands appropriately; equal strength UE and LE bilaterally; CN grossly intact; moves all extremities appropriately without ataxia; no focal neuro deficits or facial asymmetry appreciated  Psychiatric:        Behavior: Behavior normal.        Thought Content: Thought content normal.    ED Results / Procedures / Treatments   Labs (all labs ordered are listed, but only abnormal results are displayed) Labs Reviewed  CBC WITH DIFFERENTIAL/PLATELET - Abnormal; Notable for the following components:      Result Value   WBC 3.3 (*)    Platelets 105 (*)    nRBC 1.8 (*)    Neutro Abs 1.5 (*)    All other components within normal limits  BASIC METABOLIC PANEL - Abnormal; Notable for the following components:   Glucose, Bld 106 (*)    Anion gap 4 (*)    All other components within normal limits  I-STAT BETA HCG BLOOD, ED ([facility], [facility], [facility] ONLY)  TROPONIN I (HIGH SENSITIVITY)    EKG EKG Interpretation  Date/Time:  Thursday Nov 29 2021 03:53:18 EDT Ventricular Rate:  50 PR Interval:  161 QRS Duration: 98 QT Interval:  437 QTC Calculation: 399 R Axis:   53 Text Interpretation: Sinus rhythm Confirmed by Quintella Reichert [phone]) on 11/29/2021 4:24:41 AM  Radiology DG Chest 2 View  Result Date: 11/29/2021 CLINICAL DATA:  Chest pain EXAM: CHEST - 2 VIEW COMPARISON:  12/23/2020 FINDINGS: Artifact from EKG leads. Stable generous heart size. Negative aortic and hilar contours. There is no edema, consolidation, effusion, or pneumothorax. IMPRESSION: Stable exam.  No acute finding Electronically Signed   By: Jorje Guild M.D.   On:  11/29/2021 04:24    Procedures Procedures    Medications Ordered in ED Medications  diphenhydrAMINE (BENADRYL) injection 12.5 mg (12.5 mg Intravenous Given 11/29/21 0416)  metoCLOPramide (REGLAN) injection 10 mg (10 mg Intravenous Given 11/29/21 0416)  sodium chloride 0.9 % bolus 1,000 mL (1,000 mLs Intravenous New Bag/Given 11/29/21 0415)    ED Course/ Medical Decision Making/ A&P  Medical Decision Making Amount and/or Complexity of Data Reviewed Labs: ordered. Radiology: ordered and independent interpretation performed. ECG/medicine tests: ordered and independent interpretation performed.  Risk Prescription drug management.   41 year old F presenting to the ED with headache for the past several days.  Bandlike distribution around her head, throbbing in nature.  Also reports some chest pain that began tonight at work, denies this currently.  EKG is nonischemic.  Will check labs, chest x-ray, give migraine cocktail.  Currently no focal neurologic deficits or meningeal signs.  Low suspicion for intracranial hemorrhage, CVA, or meningitis.  Will treat symptomatically.  Labs overall reassuring.  White blood cell count 3.3 which appears around her baseline.  She has no electrolyte derangement.  Troponin negative.  Chest x-ray is clear.  Getting migraine cocktail now, will reassess.  5:31 AM Patient states she is feeling much better after migraine cocktail.  VSS.  Discussed results of cardiac work-up which are reassuring.  She remains without focal neurologic deficits.  Feel she is stable for discharge home.  Suspect maybe tension headache given distribution, does note increased stress recently.  Work note provided.  Follow-up with PCP.  Return here for any new or acute changes.  Final Clinical Impression(s) / ED Diagnoses Final diagnoses:  Bad headache  Chest pain in adult    Rx / DC Orders ED Discharge Orders     None         Larene Pickett,  PA-C 11/29/21 0540    Quintella Reichert, MD 11/29/21 [phone]

## 2021-12-10 ENCOUNTER — Other Ambulatory Visit: Payer: Self-pay | Admitting: Nurse Practitioner

## 2021-12-10 DIAGNOSIS — B181 Chronic viral hepatitis B without delta-agent: Secondary | ICD-10-CM

## 2021-12-10 DIAGNOSIS — K7469 Other cirrhosis of liver: Secondary | ICD-10-CM

## 2021-12-15 ENCOUNTER — Other Ambulatory Visit: Payer: Self-pay

## 2021-12-15 ENCOUNTER — Emergency Department (HOSPITAL_COMMUNITY)
Admission: EM | Admit: 2021-12-15 | Discharge: 2021-12-15 | Disposition: A | Discharge status: Home / Self Care | Payer: BC Managed Care – PPO | Attending: Emergency Medicine | Admitting: Emergency Medicine

## 2021-12-15 ENCOUNTER — Emergency Department (HOSPITAL_COMMUNITY): Payer: BC Managed Care – PPO

## 2021-12-15 ENCOUNTER — Encounter (HOSPITAL_COMMUNITY): Payer: Self-pay

## 2021-12-15 DIAGNOSIS — R1084 Generalized abdominal pain: Secondary | ICD-10-CM

## 2021-12-15 DIAGNOSIS — N200 Calculus of kidney: Secondary | ICD-10-CM | POA: Diagnosis not present

## 2021-12-15 DIAGNOSIS — H9203 Otalgia, bilateral: Secondary | ICD-10-CM | POA: Insufficient documentation

## 2021-12-15 DIAGNOSIS — K7460 Unspecified cirrhosis of liver: Secondary | ICD-10-CM | POA: Insufficient documentation

## 2021-12-15 DIAGNOSIS — R051 Acute cough: Secondary | ICD-10-CM

## 2021-12-15 DIAGNOSIS — R059 Cough, unspecified: Secondary | ICD-10-CM | POA: Diagnosis not present

## 2021-12-15 DIAGNOSIS — R109 Unspecified abdominal pain: Secondary | ICD-10-CM | POA: Diagnosis present

## 2021-12-15 LAB — COMPREHENSIVE METABOLIC PANEL
ALT: 19 U/L (ref 0–44)
AST: 29 U/L (ref 15–41)
Albumin: 3.6 g/dL (ref 3.5–5.0)
Alkaline Phosphatase: 67 U/L (ref 38–126)
Anion gap: 6 (ref 5–15)
BUN: 11 mg/dL (ref 6–20)
CO2: 25 mmol/L (ref 22–32)
Calcium: 9.1 mg/dL (ref 8.9–10.3)
Chloride: 109 mmol/L (ref 98–111)
Creatinine, Ser: 0.66 mg/dL (ref 0.44–1.00)
GFR, Estimated: >60 mL/min (ref >60)
Glucose, Bld: 132 mg/dL — ABNORMAL HIGH (ref 70–99)
Potassium: 4.7 mmol/L (ref 3.5–5.1)
Sodium: 140 mmol/L (ref 135–145)
Total Bilirubin: 0.8 mg/dL (ref 0.3–1.2)
Total Protein: 7 g/dL (ref 6.5–8.1)

## 2021-12-15 LAB — URINALYSIS, ROUTINE W REFLEX MICROSCOPIC
Bacteria, UA: NONE SEEN
Bilirubin Urine: NEGATIVE
Glucose, UA: NEGATIVE mg/dL
Ketones, ur: NEGATIVE mg/dL
Leukocytes,Ua: NEGATIVE
Nitrite: NEGATIVE
Protein, ur: NEGATIVE mg/dL
Specific Gravity, Urine: 1.012 (ref 1.005–1.030)
pH: 7 (ref 5.0–8.0)

## 2021-12-15 LAB — CBC
HCT: 40.9 % (ref 36.0–46.0)
Hemoglobin: 13.3 g/dL (ref 12.0–15.0)
MCH: 30.4 pg (ref 26.0–34.0)
MCHC: 32.5 g/dL (ref 30.0–36.0)
MCV: 93.4 fL (ref 80.0–100.0)
Platelets: 114 10*3/uL — ABNORMAL LOW (ref 150–400)
RBC: 4.38 MIL/uL (ref 3.87–5.11)
RDW: 12.9 % (ref 11.5–15.5)
WBC: 3.8 10*3/uL — ABNORMAL LOW (ref 4.0–10.5)
nRBC: 0 % (ref 0.0–0.2)

## 2021-12-15 LAB — I-STAT BETA HCG BLOOD, ED (MC, WL, AP ONLY): I-stat hCG, quantitative: <5 m[IU]/mL (ref <5)

## 2021-12-15 LAB — LIPASE, BLOOD: Lipase: 53 U/L — ABNORMAL HIGH (ref 11–51)

## 2021-12-15 MED ORDER — HYDROCODONE-ACETAMINOPHEN 5-325 MG PO TABS
1.0000 | ORAL_TABLET | Freq: Once | ORAL | Status: AC
  Administered 2021-12-15: 1 via ORAL
  Filled 2021-12-15: qty 1

## 2021-12-15 NOTE — ED Provider Notes (Signed)
[facility] Emergency Department Provider Note MRN:  [id number]  Arrival date & time: 12/15/21     Chief Complaint   Abdominal Pain   History of Present Illness   Brenda Cannon is a 41 y.o. year-old female presents to the ED with chief complaint of cough and abdominal pain.  She reports having been recently diagnosed with otitis media and was started on Amox '875mg'$ .  She states that shortly thereafter, she developed abdominal cramps, but denies nausea, vomiting, or diarrhea.  She reports associated bilateral flank pain and dysuria.  She states that her doctor switched her off of the Amox, to Azithro per chart review..  History provided by patient.   Review of Systems  Pertinent review of systems noted in HPI.    Physical Exam   Vitals:   12/15/21 2107 12/15/21 2240  BP: 136/83 (!) 135/92  Pulse: 67 60  Resp: 17 18  Temp: 98.9 F (37.2 C)   SpO2: 98% 98%    CONSTITUTIONAL:  well-appearing, NAD NEURO:  Alert and oriented x 3, CN 3-12 grossly intact EYES:  eyes equal and reactive ENT/NECK:  Supple, no stridor  CARDIO:  normal rate, regular rhythm, appears well-perfused  PULM:  No respiratory distress, CTAB GI/GU:  non-distended, no focal abdominal tenderness  MSK/SPINE:  No gross deformities, no edema, moves all extremities  SKIN:  no rash, atraumatic   *Additional and/or pertinent findings included in MDM below  Diagnostic and Interventional Summary    EKG Interpretation  Date/Time:    Ventricular Rate:    PR Interval:    QRS Duration:   QT Interval:    QTC Calculation:   R Axis:     Text Interpretation:         Labs Reviewed  LIPASE, BLOOD - Abnormal; Notable for the following components:      Result Value   Lipase 53 (*)    All other components within normal limits  COMPREHENSIVE METABOLIC PANEL - Abnormal; Notable for the following components:   Glucose, Bld 132 (*)    All other components within normal limits  CBC -  Abnormal; Notable for the following components:   WBC 3.8 (*)    Platelets 114 (*)    All other components within normal limits  URINALYSIS, ROUTINE W REFLEX MICROSCOPIC - Abnormal; Notable for the following components:   Hgb urine dipstick MODERATE (*)    All other components within normal limits  I-STAT BETA HCG BLOOD, ED ([facility], [facility], [facility] ONLY)    CT Renal Stone Study  Final Result    DG Chest 2 View  Final Result      Medications  HYDROcodone-acetaminophen (NORCO/VICODIN) 5-325 MG per tablet 1 tablet (1 tablet Oral Given 12/15/21 2248)     Procedures  /  Critical Care Procedures  ED Course and Medical Decision Making  I have reviewed the triage vital signs, the nursing notes, and pertinent available records from the EMR.  Social Determinants Affecting Complexity of Care: Patient has no clinically significant social determinants affecting this chief complaint..   ED Course:   Patient here with generalized abdominal cramps.  Top differential diagnoses include UTI, GI upset 2/2 amox, [place], pyelo. Medical Decision Making Problems Addressed: Acute cough: acute illness or injury    Details: Treated by PCP with amox, switching to [employer].  No change from me. Generalized abdominal pain: acute illness or injury    Details: could be 2/2 to the amox 875, patient has discontinued  and will begin azithro tomorrow as directed by her PCP.  No change from me. Hepatic cirrhosis, unspecified hepatic cirrhosis type, unspecified whether ascites present [facility]):    Details: CTshows signs of cirrhosis.  PCP follow-up. Nephrolithiasis:    Details: no hydro or signs of UTI or septic stone.  PCP follow-up. Otalgia of both ears:    Details: Treated by PCP with amox, switching to [employer].  No change from me.  Amount and/or Complexity of Data Reviewed Labs: ordered.    Details: no significnat leukocytosis or electrolyte derangement.  Preg negative. Radiology: ordered and independent  interpretation performed.    Details: CXR negative for opacity  Risk OTC drugs. Prescription drug management.     Consultants: No consultations were needed in caring for this patient.   Treatment and Plan: Emergency department workup does not suggest an emergent condition requiring admission or immediate intervention beyond  what has been performed at this time. The patient is safe for discharge and has  been instructed to return immediately for worsening symptoms, change in  symptoms or any other concerns    Final Clinical Impressions(s) / ED Diagnoses     ICD-10-CM   1. Generalized abdominal pain  R10.84     2. Acute cough  R05.1     3. Otalgia of both ears  H92.03     4. Hepatic cirrhosis, unspecified hepatic cirrhosis type, unspecified whether ascites present ([place])  K74.60     5. Nephrolithiasis  N20.0       ED Discharge Orders     None         Discharge Instructions Discussed with and Provided to Patient:     Discharge Instructions      Switch antibiotics to the new one prescribed by your doctor tomorrow.  Take Tylenol or Motrin for pain.  Your CT scan didn't show any acute findings.  It is noted that you had a stone in your kidney, but this shouldn't cause your pain.  You liver shows some signs of cirrhosis, you should discuss this with your doctor.       Montine Circle, PA-C 12/15/21 2334    Charlesetta Shanks, MD 12/16/21 [phone]

## 2021-12-15 NOTE — Discharge Instructions (Addendum)
Switch antibiotics to the new one prescribed by your doctor tomorrow.  Take Tylenol or Motrin for pain.  Your CT scan didn't show any acute findings.  It is noted that you had a stone in your kidney, but this shouldn't cause your pain.  You liver shows some signs of cirrhosis, you should discuss this with your doctor.

## 2021-12-15 NOTE — ED Triage Notes (Signed)
Ambulatory to ED with c/o bilateral flank pain and lower abd pain since Wednesday. States she was on abx for an ear infection and then started having these sx, so she stopped abx. Also endorses pain and burning when urinating.

## 2021-12-24 ENCOUNTER — Ambulatory Visit
Admission: RE | Admit: 2021-12-24 | Discharge: 2021-12-24 | Disposition: A | Discharge status: Home / Self Care | Payer: BC Managed Care – PPO | Source: Ambulatory Visit | Attending: Nurse Practitioner | Admitting: Nurse Practitioner

## 2021-12-24 DIAGNOSIS — K7469 Other cirrhosis of liver: Secondary | ICD-10-CM

## 2021-12-24 DIAGNOSIS — B181 Chronic viral hepatitis B without delta-agent: Secondary | ICD-10-CM

## 2022-01-01 ENCOUNTER — Other Ambulatory Visit: Payer: Self-pay | Admitting: Family Medicine

## 2022-01-01 ENCOUNTER — Other Ambulatory Visit: Payer: Self-pay | Admitting: Nurse Practitioner

## 2022-01-01 DIAGNOSIS — K7469 Other cirrhosis of liver: Secondary | ICD-10-CM

## 2022-01-01 DIAGNOSIS — K739 Chronic hepatitis, unspecified: Secondary | ICD-10-CM

## 2022-01-01 DIAGNOSIS — D489 Neoplasm of uncertain behavior, unspecified: Secondary | ICD-10-CM

## 2022-01-17 ENCOUNTER — Ambulatory Visit (HOSPITAL_COMMUNITY)
Admission: EM | Admit: 2022-01-17 | Discharge: 2022-01-17 | Disposition: A | Discharge status: Home / Self Care | Payer: BC Managed Care – PPO | Attending: Physician Assistant | Admitting: Physician Assistant

## 2022-01-17 ENCOUNTER — Encounter (HOSPITAL_COMMUNITY): Payer: Self-pay

## 2022-01-17 DIAGNOSIS — R5383 Other fatigue: Secondary | ICD-10-CM | POA: Insufficient documentation

## 2022-01-17 DIAGNOSIS — R1013 Epigastric pain: Secondary | ICD-10-CM | POA: Diagnosis present

## 2022-01-17 DIAGNOSIS — R531 Weakness: Secondary | ICD-10-CM | POA: Diagnosis not present

## 2022-01-17 DIAGNOSIS — K219 Gastro-esophageal reflux disease without esophagitis: Secondary | ICD-10-CM | POA: Diagnosis not present

## 2022-01-17 DIAGNOSIS — R7989 Other specified abnormal findings of blood chemistry: Secondary | ICD-10-CM | POA: Diagnosis not present

## 2022-01-17 LAB — COMPREHENSIVE METABOLIC PANEL
ALT: 31 U/L (ref 0–44)
AST: 35 U/L (ref 15–41)
Albumin: 3.4 g/dL — ABNORMAL LOW (ref 3.5–5.0)
Alkaline Phosphatase: 73 U/L (ref 38–126)
Anion gap: 8 (ref 5–15)
BUN: 15 mg/dL (ref 6–20)
CO2: 26 mmol/L (ref 22–32)
Calcium: 8.9 mg/dL (ref 8.9–10.3)
Chloride: 106 mmol/L (ref 98–111)
Creatinine, Ser: 0.81 mg/dL (ref 0.44–1.00)
GFR, Estimated: >60 mL/min (ref >60)
Glucose, Bld: 114 mg/dL — ABNORMAL HIGH (ref 70–99)
Potassium: 4.2 mmol/L (ref 3.5–5.1)
Sodium: 140 mmol/L (ref 135–145)
Total Bilirubin: 0.7 mg/dL (ref 0.3–1.2)
Total Protein: 6.6 g/dL (ref 6.5–8.1)

## 2022-01-17 LAB — POCT URINALYSIS DIPSTICK, ED / UC
Bilirubin Urine: NEGATIVE
Glucose, UA: NEGATIVE mg/dL
Ketones, ur: NEGATIVE mg/dL
Leukocytes,Ua: NEGATIVE
Nitrite: NEGATIVE
Protein, ur: NEGATIVE mg/dL
Specific Gravity, Urine: 1.025 (ref 1.005–1.030)
Urobilinogen, UA: 0.2 mg/dL (ref 0.0–1.0)
pH: 5.5 (ref 5.0–8.0)

## 2022-01-17 LAB — CBC WITH DIFFERENTIAL/PLATELET
Abs Immature Granulocytes: 0.01 10*3/uL (ref 0.00–0.07)
Basophils Absolute: 0 10*3/uL (ref 0.0–0.1)
Basophils Relative: 0 %
Eosinophils Absolute: 0 10*3/uL (ref 0.0–0.5)
Eosinophils Relative: 2 %
HCT: 38.1 % (ref 36.0–46.0)
Hemoglobin: 12.9 g/dL (ref 12.0–15.0)
Immature Granulocytes: 0 %
Lymphocytes Relative: 43 %
Lymphs Abs: 1.1 10*3/uL (ref 0.7–4.0)
MCH: 30.1 pg (ref 26.0–34.0)
MCHC: 33.9 g/dL (ref 30.0–36.0)
MCV: 89 fL (ref 80.0–100.0)
Monocytes Absolute: 0.3 10*3/uL (ref 0.1–1.0)
Monocytes Relative: 11 %
Neutro Abs: 1.2 10*3/uL — ABNORMAL LOW (ref 1.7–7.7)
Neutrophils Relative %: 44 %
Platelets: 88 10*3/uL — ABNORMAL LOW (ref 150–400)
RBC: 4.28 MIL/uL (ref 3.87–5.11)
RDW: 12.4 % (ref 11.5–15.5)
WBC: 2.6 10*3/uL — ABNORMAL LOW (ref 4.0–10.5)
nRBC: 0 % (ref 0.0–0.2)

## 2022-01-17 LAB — TSH: TSH: 3.557 u[IU]/mL (ref 0.350–4.500)

## 2022-01-17 MED ORDER — OMEPRAZOLE 20 MG PO CPDR: 20.0000 mg | DELAYED_RELEASE_CAPSULE | Freq: Two times a day (BID) | ORAL | 0 refills | Status: DC

## 2022-01-17 NOTE — Discharge Instructions (Addendum)
Advised to continue taking the multivitamins, vitamin D, vitamin B. The nurse has drawn labs to check thyroid and other levels, these should be completed within 48 hours, if you do not hear from our office then that means they are all within normal limits. Advised to follow-up with PCP or return to urgent care if symptoms fail to improve.

## 2022-01-17 NOTE — ED Triage Notes (Addendum)
Dizziness and fatigue onset last week. Patient having slight headache as well. Nausea onset of yesterday.   No one around the Patient is sick, no known sick exposure. No dietary changes or medication changes.   Patient having heartburn symptoms as well and bilateral flank pain. Some low back pain onset of yesterday.   Burning with urination, urinary frequency but or drinks a lot of water, no odor or discoloration. Patient currently on her period.

## 2022-01-17 NOTE — ED Provider Notes (Signed)
[place]    CSN: [id number] Arrival date & time: 01/17/22  1052      History   Chief Complaint Chief Complaint  Patient presents with   Dizziness   Fatigue    HPI Brenda Cannon is a 41 y.o. female.   41 year old female presents with fatigue, indigestion with reflux, intermittent dizziness.  Patient also relates having frequency and dysuria intermittently and some lower back pain. Patient denies fever, chills, nausea or vomiting.  Patient relates she is under a lot of stress at work as she is working third shift.  Patient indicates that she felt fatigued for quite a while and she is taking vitamin D3 replacement since she was diagnosed with a low vitamin D, patient is also taking vitamin B to help reduce her fatigue.  Patient has a history for the past several months of having indigestion, heartburn with reflux.  Patient does take omeprazole 40 mg twice a day to help relieve her symptoms but she is taking it intermittently.  Patient also relates she has having some lower back pain, frequency, and intermittent dysuria.  Patient relates that she needs to have several days off but would like to be out for a week as short-term disability will be active if she is out for a week or more.  Patient desires to have some labs checked due to her degree of fatigue that she is having.   Dizziness   Past Medical History:  Diagnosis Date   Abnormal Pap smear    ascus with High Risk for HPV   Carpal tunnel syndrome    Cirrhosis ([place])    De Quervain's tenosynovitis, left    Difficulty reading    Gestational diabetes    GDM with last pregnancy (2008)   History of gestational diabetes    History of hepatitis B 07/08/2005   History of malaria 07/08/2001   History of thrombocytopenia    Kidney stones    Obese    Plantar fasciitis     Patient Active Problem List   Diagnosis Date Noted   Papanicolaou smear of cervix with positive high risk human papilloma virus (HPV) test  11/08/2020   Carpal tunnel syndrome, bilateral 05/31/2020   Diabetes mellitus type 2 in obese ([place]) 03/24/2015   Metatarsal stress fracture of left foot 11/10/2013   Allergic rhinitis 05/26/2012   Carpal tunnel syndrome of left wrist 04/15/2011   History of malaria 03/04/2011   Chronic hepatitis B ([place]) 03/01/2011   H/O: C-section 03/01/2011   History of thrombocytopenia 03/01/2011    Past Surgical History:  Procedure Laterality Date   CESAREAN SECTION  2008   last pregnacy   CESAREAN SECTION  08/21/2011   Procedure: CESAREAN SECTION;  Surgeon: Donnamae Jude, MD;  Location: [facility];  Service: Gynecology;  Laterality: N/A;    OB History     Gravida  5   Para  3   Term  3   Preterm  0   AB  2   Living  3      SAB  1   IAB      Ectopic  0   Multiple  0   Live Births  3            Home Medications    Prior to Admission medications   Medication Sig Start Date End Date Taking? Authorizing Provider  Multiple Vitamins-Minerals (ALIVE WOMENS GUMMY PO) Take by mouth.   Yes [provider]  tenofovir Veva Holes)  300 MG tablet Take by mouth. 06/08/21 06/08/22 Yes [provider]  diazepam (VALIUM) 5 MG tablet Take 1 tablet (5 mg total) by mouth 30 minutes prior to MRI appt. 05/11/21   Armbruster, Carlota Raspberry, MD  ergocalciferol (VITAMIN D2) 1.25 MG (50000 UT) capsule Take 1 capsule by mouth once a week. SUNDAY    [provider]  famotidine (PEPCID) 20 MG tablet Take 20 mg by mouth daily as needed for heartburn or indigestion.    [provider]  fluticasone (FLONASE) 50 MCG/ACT nasal spray Place 1 spray into both nostrils as needed for allergies or rhinitis.    [provider]  omeprazole (PRILOSEC) 20 MG capsule Take 1 capsule (20 mg total) by mouth 2 (two) times daily before a meal. 01/17/22 02/16/22  Nyoka Lint, PA-C    Family History Family History  Problem Relation Age of Onset   Hypertension Mother    Stroke Mother     Colon cancer Neg Hx    Esophageal cancer Neg Hx    Liver cancer Neg Hx     Social History Social History   Tobacco Use   Smoking status: Never   Smokeless tobacco: Never  Vaping Use   Vaping Use: Never used  Substance Use Topics   Alcohol use: No   Drug use: No     Allergies   Cephalexin and Chloroquine   Review of Systems Review of Systems  Constitutional:  Positive for fatigue and fever.  Gastrointestinal:  Positive for abdominal pain (indigestion with reflux).  Neurological:  Positive for dizziness.     Physical Exam Triage Vital Signs ED Triage Vitals  Enc Vitals Group     BP 01/17/22 1303 (!) 148/92     Pulse Rate 01/17/22 1303 (!) 52     Resp 01/17/22 1303 16     Temp 01/17/22 1303 98.1 F (36.7 C)     Temp Source 01/17/22 1303 Oral     SpO2 01/17/22 1303 98 %     Weight 01/17/22 1306 190 lb 12.8 oz (86.5 kg)     Height 01/17/22 1306 '5\' 1"'$  (1.549 m)     Head Circumference --      Peak Flow --      Pain Score 01/17/22 1305 0     Pain Loc --      Pain Edu? --      Excl. in [place]? --    No data found.  Updated Vital Signs BP (!) 148/92 (BP Location: Right Arm)   Pulse (!) 52   Temp 98.1 F (36.7 C) (Oral)   Resp 16   Ht '5\' 1"'$  (1.549 m)   Wt 190 lb 12.8 oz (86.5 kg)   LMP 01/15/2022 (Exact Date)   SpO2 98%   BMI 36.05 kg/m   Visual Acuity Right Eye Distance:   Left Eye Distance:   Bilateral Distance:    Right Eye Near:   Left Eye Near:    Bilateral Near:     Physical Exam Constitutional:      Appearance: Normal appearance.  HENT:     Right Ear: Tympanic membrane and ear canal normal.     Left Ear: Tympanic membrane and ear canal normal.     Mouth/Throat:     Mouth: Mucous membranes are moist.     Pharynx: Oropharynx is clear. Uvula midline. No pharyngeal swelling.  Cardiovascular:     Rate and Rhythm: Normal rate and regular rhythm.     Heart sounds:  Normal heart sounds.  Pulmonary:     Effort: Pulmonary effort is normal.      Breath sounds: Normal breath sounds. No wheezing, rhonchi or rales.  Abdominal:     General: Abdomen is flat. Bowel sounds are normal.     Palpations: Abdomen is soft.     Tenderness: There is no abdominal tenderness. There is no guarding or rebound.  Lymphadenopathy:     Cervical: No cervical adenopathy.  Neurological:     Mental Status: She is alert and oriented to person, place, and time.     Cranial Nerves: Cranial nerves 2-12 are intact.     Coordination: Coordination is intact.      UC Treatments / Results  Labs (all labs ordered are listed, but only abnormal results are displayed) Labs Reviewed  POCT URINALYSIS DIPSTICK, ED / UC - Abnormal; Notable for the following components:      Result Value   Hgb urine dipstick LARGE (*)    All other components within normal limits  URINE CULTURE  CBC WITH DIFFERENTIAL/PLATELET  COMPREHENSIVE METABOLIC PANEL  TSH  POCT URINALYSIS DIPSTICK, ED / UC    EKG   Radiology No results found.  Procedures Procedures (including critical care time)  Medications Ordered in UC Medications - No data to display  Initial Impression / Assessment and Plan / UC Course  I have reviewed the triage vital signs and the nursing notes.  Pertinent labs & imaging results that were available during my care of the patient were reviewed by me and considered in my medical decision making (see chart for details).    Plan: 1.  Advised to continue taking the omeprazole 40 mg twice a day to help control the dyspepsia and reflux. 2.  Advised to continue taking the multivitamins to help reduce the fatigue. 3.  Labs have been drawn for a CBC, CMP, and TSH, these are pending 4.  Urine culture is pending 5.  Patient advised to follow-up PCP or return to urgent care if symptoms fail to improve. Final Clinical Impressions(s) / UC Diagnoses   Final diagnoses:  Other fatigue  Dyspepsia  Low vitamin D level  Weakness  Gastroesophageal reflux disease  without esophagitis     Discharge Instructions      Advised to continue taking the multivitamins, vitamin D, vitamin B. The nurse has drawn labs to check thyroid and other levels, these should be completed within 48 hours, if you do not hear from our office then that means they are all within normal limits. Advised to follow-up with PCP or return to urgent care if symptoms fail to improve.    ED Prescriptions     Medication Sig Dispense Auth. Provider   omeprazole (PRILOSEC) 20 MG capsule Take 1 capsule (20 mg total) by mouth 2 (two) times daily before a meal. 60 capsule Nyoka Lint, PA-C      PDMP not reviewed this encounter.   Nyoka Lint, PA-C 01/17/22 1412

## 2022-01-18 LAB — URINE CULTURE: Culture: NO GROWTH

## 2022-02-05 ENCOUNTER — Ambulatory Visit
Admission: RE | Admit: 2022-02-05 | Discharge: 2022-02-05 | Disposition: A | Discharge status: Home / Self Care | Payer: BC Managed Care – PPO | Source: Ambulatory Visit | Attending: Nurse Practitioner | Admitting: Nurse Practitioner

## 2022-02-05 DIAGNOSIS — K7469 Other cirrhosis of liver: Secondary | ICD-10-CM

## 2022-02-05 DIAGNOSIS — D489 Neoplasm of uncertain behavior, unspecified: Secondary | ICD-10-CM

## 2022-02-05 DIAGNOSIS — K739 Chronic hepatitis, unspecified: Secondary | ICD-10-CM

## 2022-02-05 MED ORDER — IOPAMIDOL (ISOVUE-300) INJECTION 61%
100.0000 mL | Freq: Once | INTRAVENOUS | Status: AC | PRN
  Administered 2022-02-05: 100 mL via INTRAVENOUS

## 2022-07-18 ENCOUNTER — Ambulatory Visit (HOSPITAL_COMMUNITY)
Admission: EM | Admit: 2022-07-18 | Discharge: 2022-07-18 | Disposition: A | Discharge status: Home / Self Care | Payer: BC Managed Care – PPO | Attending: Sports Medicine | Admitting: Sports Medicine

## 2022-07-18 ENCOUNTER — Encounter (HOSPITAL_COMMUNITY): Payer: Self-pay | Admitting: Emergency Medicine

## 2022-07-18 DIAGNOSIS — H938X3 Other specified disorders of ear, bilateral: Secondary | ICD-10-CM

## 2022-07-18 DIAGNOSIS — R0981 Nasal congestion: Secondary | ICD-10-CM

## 2022-07-18 DIAGNOSIS — R519 Headache, unspecified: Secondary | ICD-10-CM

## 2022-07-18 LAB — POC INFLUENZA A AND B ANTIGEN (URGENT CARE ONLY)
INFLUENZA A ANTIGEN, POC: NEGATIVE
INFLUENZA B ANTIGEN, POC: NEGATIVE

## 2022-07-18 MED ORDER — GUAIFENESIN ER 600 MG PO TB12: 600.0000 mg | ORAL_TABLET | Freq: Two times a day (BID) | ORAL | 0 refills | Status: DC

## 2022-07-18 MED ORDER — FLUTICASONE PROPIONATE 50 MCG/ACT NA SUSP: 2.0000 | Freq: Every day | NASAL | 0 refills | Status: DC

## 2022-07-18 NOTE — ED Provider Notes (Addendum)
[place]    CSN: [id number] Arrival date & time: 07/18/22  0813      History   Chief Complaint No chief complaint on file.   HPI Brenda Cannon is a 42 y.o. female.   She is here today with chief complaint of nasal congestion, headache and ear fullness that began about 3 days ago.  She took some Tylenol Cold and flu for a couple of days and has not had any improvement.  Headache is located on the sides of her head and has some tightness in her neck as well.  She reports subjective fever, feeling warm in the evenings.  She denies any chest pain, cough, nausea, vomiting, diarrhea or cough.  She does not know of any sick contacts but works in a [place] [employer] with lots of other people.  She is requesting to be tested for flu today.     Past Medical History:  Diagnosis Date   Abnormal Pap smear    ascus with High Risk for HPV   Carpal tunnel syndrome    Cirrhosis ([place])    De Quervain's tenosynovitis, left    Difficulty reading    Gestational diabetes    GDM with last pregnancy (2008)   History of gestational diabetes    History of hepatitis B 07/08/2005   History of malaria 07/08/2001   History of thrombocytopenia    Kidney stones    Obese    Plantar fasciitis     Patient Active Problem List   Diagnosis Date Noted   Papanicolaou smear of cervix with positive high risk human papilloma virus (HPV) test 11/08/2020   Carpal tunnel syndrome, bilateral 05/31/2020   Diabetes mellitus type 2 in obese ([place]) 03/24/2015   Metatarsal stress fracture of left foot 11/10/2013   Allergic rhinitis 05/26/2012   Carpal tunnel syndrome of left wrist 04/15/2011   History of malaria 03/04/2011   Chronic hepatitis B ([place]) 03/01/2011   H/O: C-section 03/01/2011   History of thrombocytopenia 03/01/2011    Past Surgical History:  Procedure Laterality Date   CESAREAN SECTION  2008   last pregnacy   CESAREAN SECTION  08/21/2011   Procedure: CESAREAN SECTION;  Surgeon: Donnamae Jude, MD;  Location: [facility];  Service: Gynecology;  Laterality: N/A;    OB History     Gravida  5   Para  3   Term  3   Preterm  0   AB  2   Living  3      SAB  1   IAB      Ectopic  0   Multiple  0   Live Births  3            Home Medications    Prior to Admission medications   Medication Sig Start Date End Date Taking? Authorizing Provider  fluticasone (FLONASE) 50 MCG/ACT nasal spray Place 2 sprays into both nostrils daily. 07/18/22  Yes Rodena Goldmann A, DO  guaiFENesin (MUCINEX) 600 MG 12 hr tablet Take 1 tablet (600 mg total) by mouth 2 (two) times daily. 07/18/22  Yes Rodena Goldmann A, DO  ergocalciferol (VITAMIN D2) 1.25 MG (50000 UT) capsule Take 1 capsule by mouth once a week. SUNDAY    [provider]  fluticasone (FLONASE) 50 MCG/ACT nasal spray Place 1 spray into both nostrils as needed for allergies or rhinitis.    [provider]  Multiple Vitamins-Minerals (ALIVE WOMENS GUMMY PO) Take by mouth.    [provider]  omeprazole (PRILOSEC) 20 MG capsule Take 1 capsule (20 mg total) by mouth 2 (two) times daily before a meal. 01/17/22 02/16/22  Nyoka Lint, PA-C    Family History Family History  Problem Relation Age of Onset   Hypertension Mother    Stroke Mother    Colon cancer Neg Hx    Esophageal cancer Neg Hx    Liver cancer Neg Hx     Social History Social History   Tobacco Use   Smoking status: Never   Smokeless tobacco: Never  Vaping Use   Vaping Use: Never used  Substance Use Topics   Alcohol use: No   Drug use: No     Allergies   Cephalexin and Chloroquine   Review of Systems Review of Systems As listed above in HPI  Physical Exam Triage Vital Signs ED Triage Vitals  Enc Vitals Group     BP 07/18/22 0906 (!) 135/93     Pulse Rate 07/18/22 0906 60     Resp 07/18/22 0906 16     Temp 07/18/22 0906 98 F (36.7 C)     Temp Source 07/18/22 0906 Oral     SpO2 07/18/22 0906 97 %      Weight --      Height --      Head Circumference --      Peak Flow --      Pain Score 07/18/22 0905 6     Pain Loc --      Pain Edu? --      Excl. in [place]? --    No data found.  Updated Vital Signs BP (!) 135/93 (BP Location: Right Arm)   Pulse 60   Temp 98 F (36.7 C) (Oral)   Resp 16   LMP 07/17/2022   SpO2 97%   Physical Exam Vitals reviewed.  Constitutional:      General: She is not in acute distress.    Appearance: Normal appearance. She is obese. She is not ill-appearing, toxic-appearing or diaphoretic.  HENT:     Right Ear: Tympanic membrane, ear canal and external ear normal. There is no impacted cerumen.     Left Ear: Tympanic membrane, ear canal and external ear normal. There is no impacted cerumen.     Nose:     Right Sinus: Frontal sinus tenderness present. No maxillary sinus tenderness.     Left Sinus: Frontal sinus tenderness present. No maxillary sinus tenderness.  Cardiovascular:     Rate and Rhythm: Normal rate.  Pulmonary:     Effort: Pulmonary effort is normal. No respiratory distress.     Breath sounds: No wheezing, rhonchi or rales.  Skin:    General: Skin is warm.  Neurological:     Mental Status: She is alert.  Psychiatric:        Mood and Affect: Mood normal.        Behavior: Behavior normal.        Thought Content: Thought content normal.        Judgment: Judgment normal.      UC Treatments / Results  Labs (all labs ordered are listed, but only abnormal results are displayed) Labs Reviewed  POC INFLUENZA A AND B ANTIGEN (URGENT CARE ONLY)    EKG   Radiology No results found.  Procedures Procedures (including critical care time)  Medications Ordered in UC Medications - No data to display  Initial Impression / Assessment and Plan / UC Course  I have reviewed  the triage vital signs and the nursing notes.  Pertinent labs & imaging results that were available during my care of the patient were reviewed by me and considered in my  medical decision making (see chart for details).     Nasal congestion, headache, ear fullness and subjective chills and fever.  She is afebrile here today, point-of-care flu testing negative.  Continue with supportive care I recommend she use some Flonase for her nasal congestion and ear fullness, humidifier and she may use some over-the-counter decongestants.  I recommend ample rest, adequate hydration and meals.  She verbalized understanding.  Follow-up with primary care provider if symptoms worsen or fail to improve over the next 5 to 7 days.  Final diagnoses:  Nasal congestion  Acute nonintractable headache, unspecified headache type  Ear fullness, bilateral     Discharge Instructions      Your flu test today was negative. For your congestion and ear fullness today you likely have been exposed to a viral illness. Recommend she continue with Tylenol and ibuprofen interchangeably every couple of hours as needed, some Flonase and a decongestant.  You may pick these up over-the-counter at your local pharmacy.  You also try humidifier in your home to help with your symptoms.  Remember to get adequate amount of rest, drink lots of water and eat small meals as tolerated.  If your symptoms worsen or do not improve after 7 days return to see your primary care provider or the urgent care.    ED Prescriptions     Medication Sig Dispense Auth. Provider   fluticasone (FLONASE) 50 MCG/ACT nasal spray Place 2 sprays into both nostrils daily. 11 mL Sinaya Minogue A, DO   guaiFENesin (MUCINEX) 600 MG 12 hr tablet Take 1 tablet (600 mg total) by mouth 2 (two) times daily. 14 tablet Rodena Goldmann A, DO      PDMP not reviewed this encounter.   Elmore Guise, DO 07/18/22 2263    Elmore Guise, DO 07/18/22 1005

## 2022-07-18 NOTE — Discharge Instructions (Addendum)
Your flu test today was negative. For your congestion and ear fullness today you likely have been exposed to a viral illness. Recommend she continue with Tylenol and ibuprofen interchangeably every couple of hours as needed, some Flonase and a decongestant.  You may pick these up over-the-counter at your local pharmacy.  You also try humidifier in your home to help with your symptoms.  Remember to get adequate amount of rest, drink lots of water and eat small meals as tolerated.  If your symptoms worsen or do not improve after 7 days return to see your primary care provider or the urgent care.

## 2022-07-18 NOTE — ED Triage Notes (Signed)
For 3 days pt had cough, congestion, sinus pain and bilat ear pain. Took tylenol cold and flu

## 2022-08-16 ENCOUNTER — Emergency Department (HOSPITAL_COMMUNITY)
Admission: EM | Admit: 2022-08-16 | Discharge: 2022-08-16 | Disposition: A | Discharge status: Home / Self Care | Payer: No Typology Code available for payment source | Attending: Emergency Medicine | Admitting: Emergency Medicine

## 2022-08-16 ENCOUNTER — Encounter (HOSPITAL_COMMUNITY): Payer: Self-pay

## 2022-08-16 DIAGNOSIS — R1013 Epigastric pain: Secondary | ICD-10-CM | POA: Diagnosis present

## 2022-08-16 DIAGNOSIS — K2100 Gastro-esophageal reflux disease with esophagitis, without bleeding: Secondary | ICD-10-CM | POA: Insufficient documentation

## 2022-08-16 MED ORDER — ALUM & MAG HYDROXIDE-SIMETH 200-200-20 MG/5ML PO SUSP
30.0000 mL | Freq: Once | ORAL | Status: AC
  Administered 2022-08-16: 30 mL via ORAL
  Filled 2022-08-16: qty 30

## 2022-08-16 NOTE — ED Triage Notes (Signed)
Pt c/o chest pain that started last night, describes as sharp. Hurts to bend over and pick things up. Also c/o headache and neck pain that started last week.

## 2022-08-16 NOTE — Discharge Instructions (Signed)
Mylanta s needed.

## 2022-08-16 NOTE — ED Provider Notes (Signed)
[place] EMERGENCY DEPARTMENT AT [facility] Provider Note   CSN: [id number] Arrival date & time: 08/16/22  [id number]     History  Chief Complaint  Patient presents with   Chest Pain    Brenda Cannon is a 42 y.o. female.  Patient reports eating late last night and when she went to bed she had an episode of discomfort in her chest.  Rec patient reports belching and experiencing some acid reflux.  Patient reports that she has continued to have some soreness in her chest today.  Patient reports the soreness is constant.  Patient also reports that she has had soreness in her neck and a headache over the last week that comes and goes  The history is provided by the patient. No language interpreter was used.  Chest Pain Pain location:  Epigastric Pain quality: aching   Worsened by:  Nothing Ineffective treatments:  None tried Risk factors: no high cholesterol and no hypertension        Home Medications Prior to Admission medications   Medication Sig Start Date End Date Taking? Authorizing Provider  ergocalciferol (VITAMIN D2) 1.25 MG (50000 UT) capsule Take 1 capsule by mouth once a week. SUNDAY    [provider]  fluticasone (FLONASE) 50 MCG/ACT nasal spray Place 1 spray into both nostrils as needed for allergies or rhinitis.    [provider]  fluticasone (FLONASE) 50 MCG/ACT nasal spray Place 2 sprays into both nostrils daily. 07/18/22   Elmore Guise, DO  guaiFENesin (MUCINEX) 600 MG 12 hr tablet Take 1 tablet (600 mg total) by mouth 2 (two) times daily. 07/18/22   Elmore Guise, DO  Multiple Vitamins-Minerals (ALIVE WOMENS GUMMY PO) Take by mouth.    [provider]  omeprazole (PRILOSEC) 20 MG capsule Take 1 capsule (20 mg total) by mouth 2 (two) times daily before a meal. 01/17/22 02/16/22  Nyoka Lint, PA-C      Allergies    Cephalexin and Chloroquine    Review of Systems   Review of Systems  Cardiovascular:  Positive  for chest pain.  All other systems reviewed and are negative.   Physical Exam Updated Vital Signs BP (!) 142/95 (BP Location: Right Arm)   Pulse (!) 58   Temp 98.3 F (36.8 C) (Oral)   Resp 18   LMP 07/17/2022   SpO2 100%  Physical Exam Vitals and nursing note reviewed.  Constitutional:      Appearance: She is well-developed.  HENT:     Head: Normocephalic.  Cardiovascular:     Rate and Rhythm: Normal rate and regular rhythm.     Heart sounds: Normal heart sounds.  Pulmonary:     Effort: Pulmonary effort is normal.     Breath sounds: Normal breath sounds.  Abdominal:     General: There is no distension.     Palpations: Abdomen is soft.  Musculoskeletal:        General: Normal range of motion.     Cervical back: Normal range of motion.  Neurological:     Mental Status: She is alert and oriented to person, place, and time.     ED Results / Procedures / Treatments   Labs (all labs ordered are listed, but only abnormal results are displayed) Labs Reviewed - No data to display  EKG EKG Interpretation  Date/Time:  Friday August 16 2022 09:18:15 EST Ventricular Rate:  59 PR Interval:  161 QRS Duration: 95 QT Interval:  427 QTC  Calculation: 423 R Axis:   53 Text Interpretation: Sinus rhythm Borderline T abnormalities, anterior leads No significant change was found Confirmed by Ezequiel Essex [phone]) on 08/16/2022 10:22:01 AM  Radiology No results found.  Procedures Procedures    Medications Ordered in ED Medications  alum & mag hydroxide-simeth (MAALOX/MYLANTA) 200-200-20 MG/5ML suspension 30 mL (30 mLs Oral Given 08/16/22 1034)    ED Course/ Medical Decision Making/ A&P                             Medical Decision Making Complains of reflux that occurred last night.  She is here with her son who is being seen for a respiratory illness and decided she should get checked  Amount and/or Complexity of Data Reviewed ECG/medicine tests: ordered and  independent interpretation performed. Decision-making details documented in ED Course.    Details: EKG obtained and reviewed no acute abnormality  Risk OTC drugs. Risk Details: Patient given Mylanta p.o. patient reports complete relief from discomfort patient feels well and wants to go home.  I do not think that she is experiencing any type of cardiac or pulmonary pain.  I suspect pain is from reflux patient is advised to try Mylanta or Pepcid at home she is advised to follow-up with her primary care physician           Final Clinical Impression(s) / ED Diagnoses Final diagnoses:  Gastroesophageal reflux disease with esophagitis, unspecified whether hemorrhage    Rx / DC Orders ED Discharge Orders     None     An After Visit Summary was printed and given to the patient.     Fransico Meadow, [place] 08/16/22 1426    Ezequiel Essex, MD 08/16/22 1524

## 2022-09-14 ENCOUNTER — Emergency Department (HOSPITAL_COMMUNITY)
Admission: EM | Admit: 2022-09-14 | Discharge: 2022-09-14 | Disposition: A | Discharge status: Home / Self Care | Payer: No Typology Code available for payment source | Attending: Emergency Medicine | Admitting: Emergency Medicine

## 2022-09-14 ENCOUNTER — Other Ambulatory Visit: Payer: Self-pay

## 2022-09-14 DIAGNOSIS — G8929 Other chronic pain: Secondary | ICD-10-CM | POA: Insufficient documentation

## 2022-09-14 DIAGNOSIS — M5440 Lumbago with sciatica, unspecified side: Secondary | ICD-10-CM | POA: Insufficient documentation

## 2022-09-14 DIAGNOSIS — M546 Pain in thoracic spine: Secondary | ICD-10-CM | POA: Insufficient documentation

## 2022-09-14 DIAGNOSIS — M545 Low back pain, unspecified: Secondary | ICD-10-CM

## 2022-09-14 MED ORDER — KETOROLAC TROMETHAMINE 60 MG/2ML IM SOLN
30.0000 mg | Freq: Once | INTRAMUSCULAR | Status: AC
Start: 2022-09-14 — End: 2022-09-14
  Administered 2022-09-14: 30 mg via INTRAMUSCULAR
  Filled 2022-09-14: qty 2

## 2022-09-14 MED ORDER — DEXAMETHASONE SODIUM PHOSPHATE 10 MG/ML IJ SOLN
8.0000 mg | Freq: Once | INTRAMUSCULAR | Status: AC
Start: 2022-09-14 — End: 2022-09-14
  Administered 2022-09-14: 8 mg via INTRAMUSCULAR
  Filled 2022-09-14: qty 1

## 2022-09-14 MED ORDER — IBUPROFEN 400 MG PO TABS: 400.0000 mg | ORAL_TABLET | Freq: Three times a day (TID) | ORAL | 0 refills | Status: DC | PRN

## 2022-09-14 MED ORDER — GABAPENTIN 300 MG PO CAPS: 300.0000 mg | ORAL_CAPSULE | Freq: Three times a day (TID) | ORAL | 0 refills | Status: DC | PRN

## 2022-09-14 NOTE — ED Provider Notes (Signed)
[place] Provider Note   CSN: [id number] Arrival date & time: 09/14/22  1123     History  Chief Complaint  Patient presents with   Back Pain    Brenda Cannon is a 42 y.o. female presenting to the hospital with acute on chronic back pain.  She reports a flareup about 3 weeks ago with pain in her lower back while she was bending over.  It is localized in her lower mid back.  No falls or trauma.  Does not radiate down her legs.  The pain is worse when bending over.  HPI     Home Medications Prior to Admission medications   Medication Sig Start Date End Date Taking? Authorizing Provider  gabapentin (NEURONTIN) 300 MG capsule Take 1 capsule (300 mg total) by mouth 3 (three) times daily as needed. 09/14/22 10/14/22 Yes Doylene Splinter, Carola Rhine, MD  ibuprofen (ADVIL) 400 MG tablet Take 1 tablet (400 mg total) by mouth every 8 (eight) hours as needed for up to 21 doses for mild pain or moderate pain. Take with food 09/14/22  Yes Labella Zahradnik, Carola Rhine, MD  ergocalciferol (VITAMIN D2) 1.25 MG (50000 UT) capsule Take 1 capsule by mouth once a week. SUNDAY    [provider]  fluticasone (FLONASE) 50 MCG/ACT nasal spray Place 1 spray into both nostrils as needed for allergies or rhinitis.    [provider]  fluticasone (FLONASE) 50 MCG/ACT nasal spray Place 2 sprays into both nostrils daily. 07/18/22   Elmore Guise, DO  guaiFENesin (MUCINEX) 600 MG 12 hr tablet Take 1 tablet (600 mg total) by mouth 2 (two) times daily. 07/18/22   Elmore Guise, DO  Multiple Vitamins-Minerals (ALIVE WOMENS GUMMY PO) Take by mouth.    [provider]  omeprazole (PRILOSEC) 20 MG capsule Take 1 capsule (20 mg total) by mouth 2 (two) times daily before a meal. 01/17/22 02/16/22  Nyoka Lint, PA-C      Allergies    Cephalexin and Chloroquine    Review of Systems   Review of Systems  Physical Exam Updated Vital Signs BP (!) 144/73 (BP  Location: Left Arm)   Pulse 77   Temp 98.4 F (36.9 C) (Oral)   Resp 18   Ht '5\' 1"'$  (1.549 m)   Wt 90.7 kg   SpO2 100%   BMI 37.79 kg/m  Physical Exam Constitutional:      General: She is not in acute distress. HENT:     Head: Normocephalic and atraumatic.  Eyes:     Conjunctiva/sclera: Conjunctivae normal.     Pupils: Pupils are equal, round, and reactive to light.  Cardiovascular:     Rate and Rhythm: Normal rate and regular rhythm.  Pulmonary:     Effort: Pulmonary effort is normal. No respiratory distress.  Skin:    General: Skin is warm and dry.  Neurological:     General: No focal deficit present.     Mental Status: She is alert and oriented to person, place, and time. Mental status is at baseline.     Cranial Nerves: No cranial nerve deficit.     Sensory: No sensory deficit.     Motor: No weakness.  Psychiatric:        Mood and Affect: Mood normal.        Behavior: Behavior normal.     ED Results / Procedures / Treatments   Labs (all labs ordered are listed, but only abnormal  results are displayed) Labs Reviewed - No data to display  EKG None  Radiology No results found.  Procedures Procedures    Medications Ordered in ED Medications  ketorolac (TORADOL) injection 30 mg (has no administration in time range)  dexamethasone (DECADRON) injection 8 mg (has no administration in time range)    ED Course/ Medical Decision Making/ A&P                             Medical Decision Making  Patient is here with low back pain, may be related to disc herniation versus muscle spasm versus other.  She does not have red flags for cauda equina syndrome is not requiring an MRI at this time.  There is no traumatic mechanism to suspect a fracture and I do not see benefit in x-ray imaging.  We will treat her with anti-inflammatory medications, IM Toradol and Decadron given in the ED.  Gabapentin prescribed for home.  She is ready taken Tylenol.  She will follow-up with  her PCP for this issue, and has an appointment coming up in 2 weeks.  She verbalized understanding.        Final Clinical Impression(s) / ED Diagnoses Final diagnoses:  Low back pain, unspecified back pain laterality, unspecified chronicity, unspecified whether sciatica present    Rx / DC Orders ED Discharge Orders          Ordered    gabapentin (NEURONTIN) 300 MG capsule  3 times daily PRN        09/14/22 1220    ibuprofen (ADVIL) 400 MG tablet  Every 8 hours PRN        09/14/22 1220              Wyvonnia Dusky, MD 09/14/22 1220

## 2022-09-14 NOTE — ED Triage Notes (Signed)
Pt arrived via POV. C/o severe lower back pain, that has been going on for years and makes it difficult to bend over. No urinary issues.  AOx4

## 2022-09-16 IMAGING — CT CT HEAD W/O CM
3 series · 15 of 47 positions shown, 18 images · non-contrast
Comparison: None.

CLINICAL DATA: Intermittent left arm numbness.  Headache.

EXAM:
CT HEAD WITHOUT CONTRAST
TECHNIQUE: Contiguous axial images were obtained from the base of the skull
through the vertex without intravenous contrast.

[Series 2: head wo · axial · 0.47mm/px · z∈[-126,-1]mm · 9 of 30 slices shown, 12 images]
[im 3/30  brain]
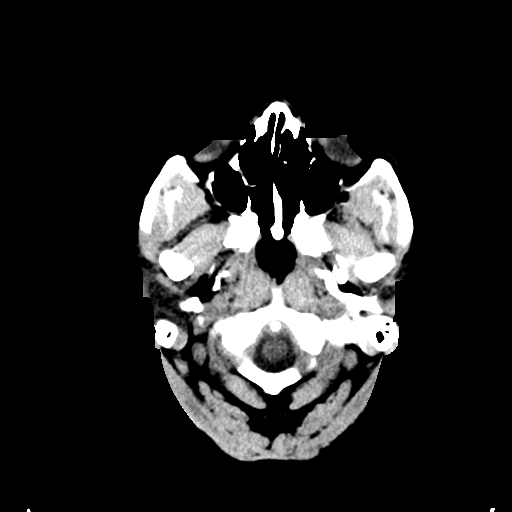
[im 3/30  bone]
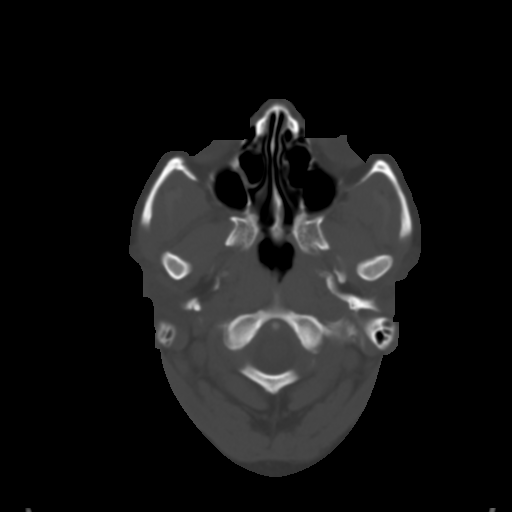
[im 6/30  brain]
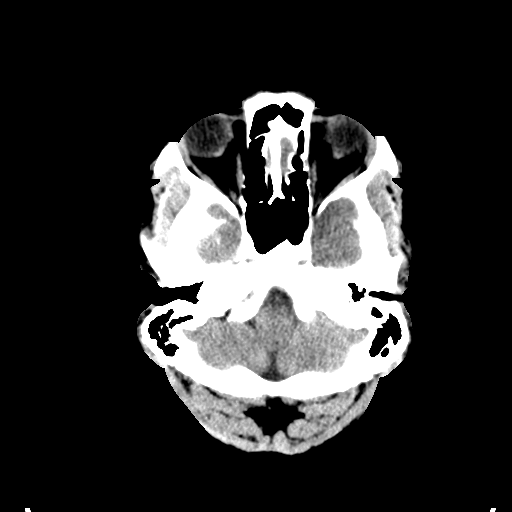
[im 9/30  brain]
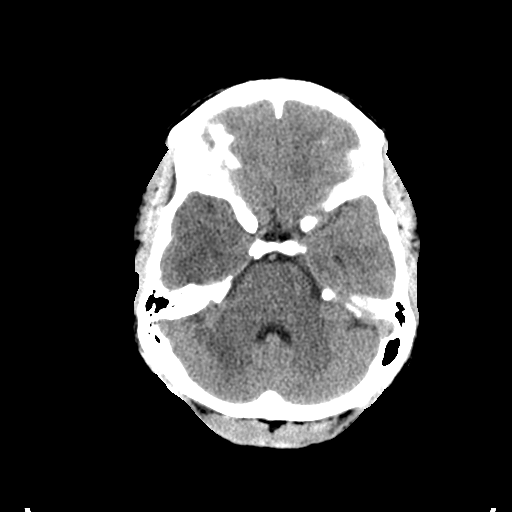
[im 12/30  brain]
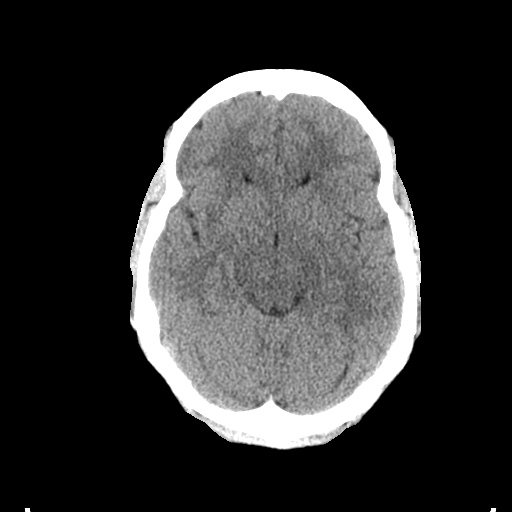
[im 16/30  brain]
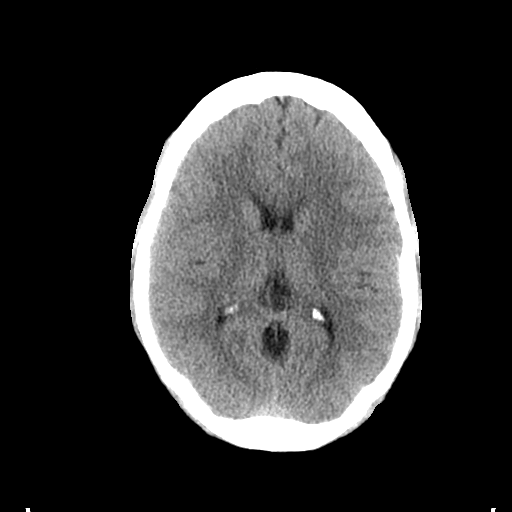
[im 16/30  bone]
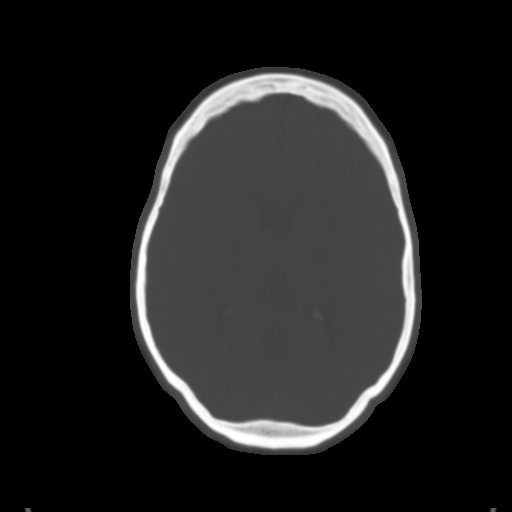
[im 19/30  brain]
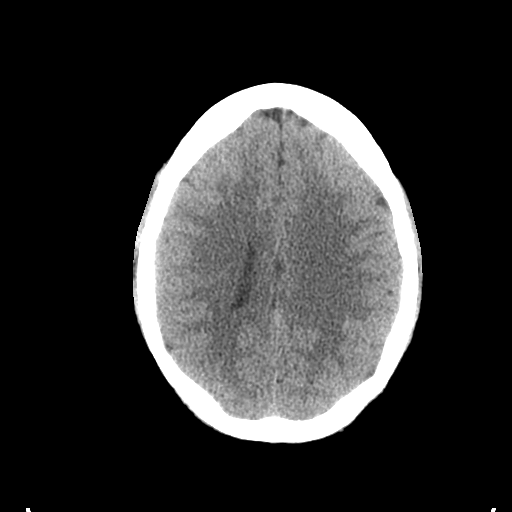
[im 22/30  brain]
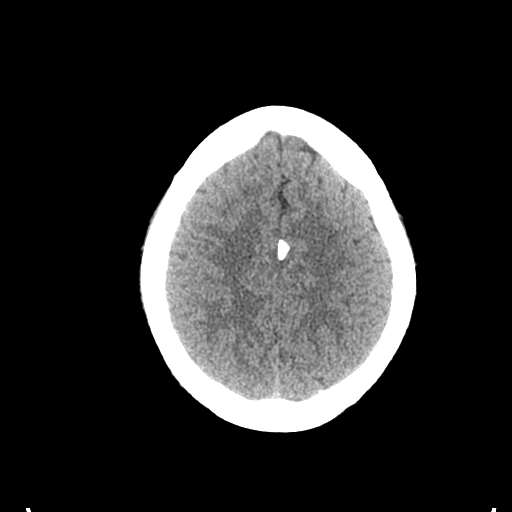
[im 25/30  brain]
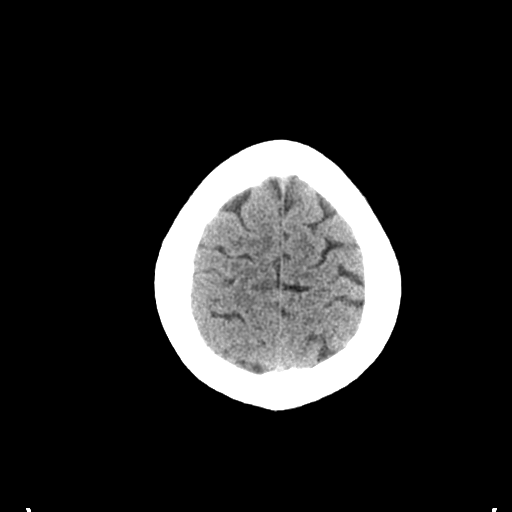
[im 28/30  brain]
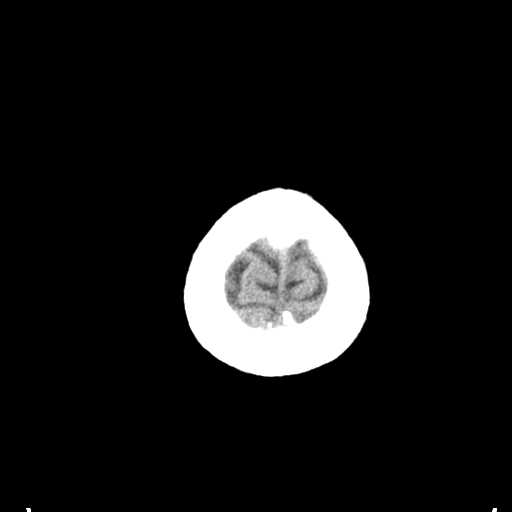
[im 28/30  bone]
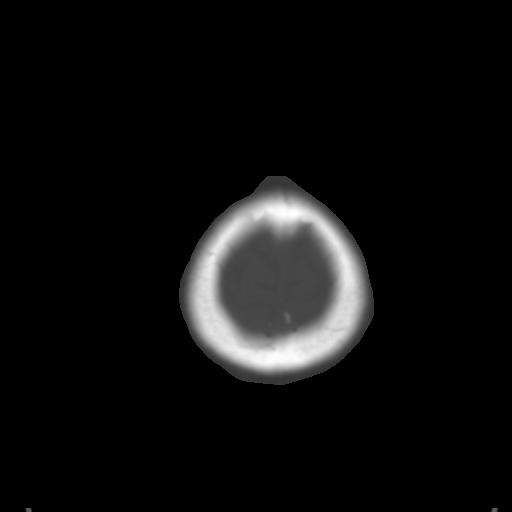

[Series 5: coronal soft tissue · coronal · 0.31mm/px · 3 of 67 slices shown]
[im 23/67  brain]
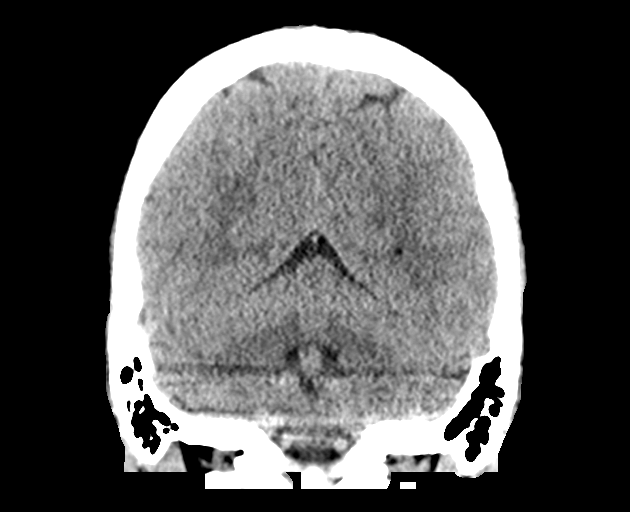
[im 30/67  brain]
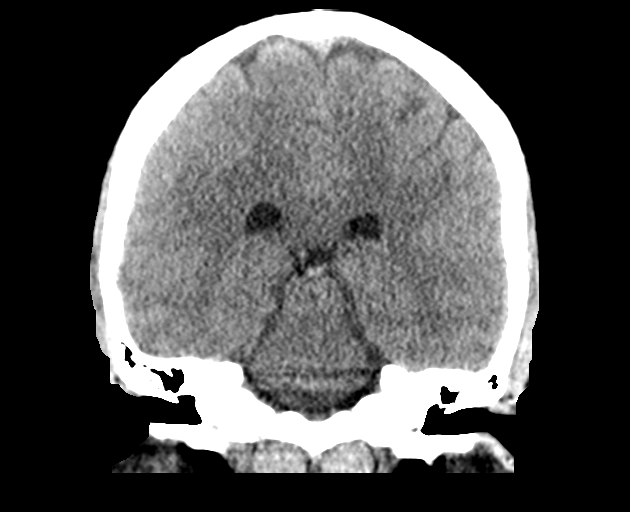
[im 37/67  brain]
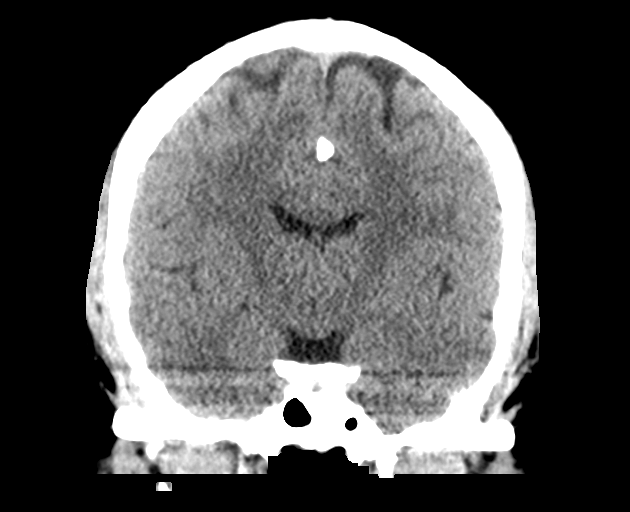

[Series 6: sagittal soft tissue · sagittal · 0.30mm/px · 3 of 55 slices shown]
[im 19/55  brain]
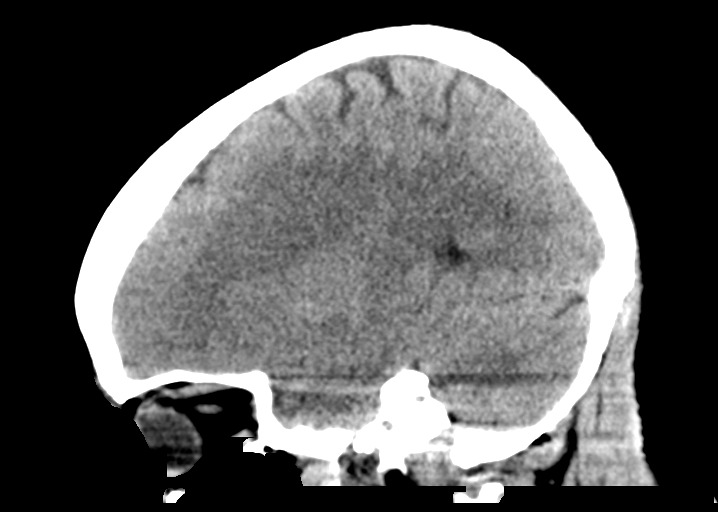
[im 28/55  brain]
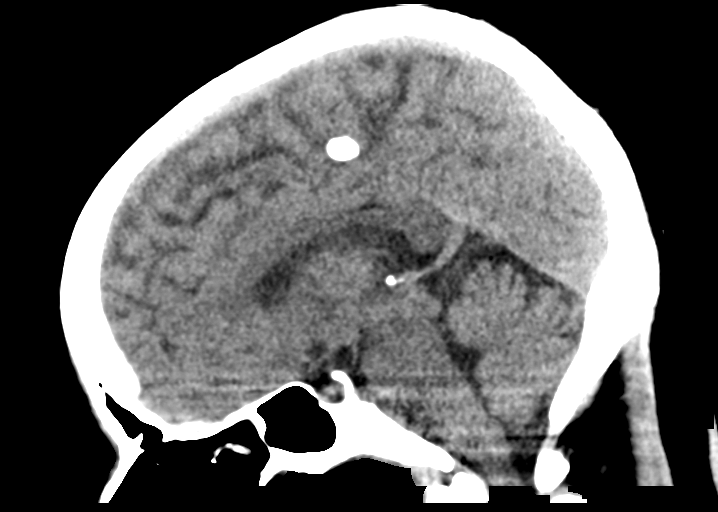
[im 37/55  brain]
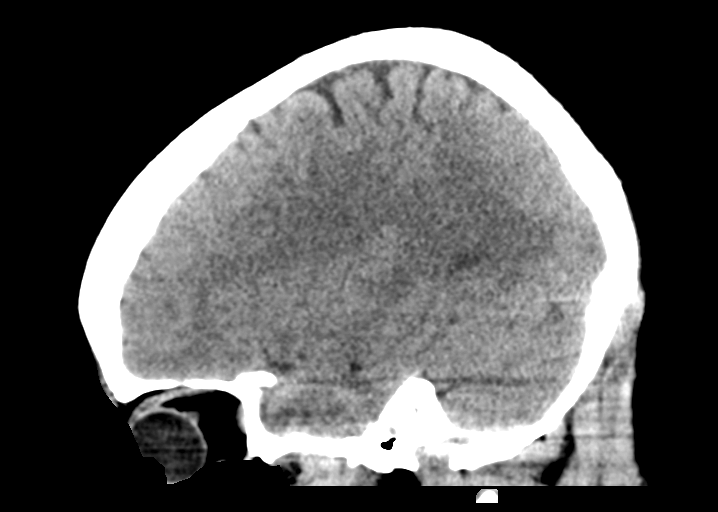

[15 of 47 positions shown; findings below may reference images not displayed]

FINDINGS: Brain: No evidence of acute large vascular territory infarction,
hemorrhage, hydrocephalus, extra-axial collection or mass
lesion/mass effect.

Vascular: No hyperdense vessel or unexpected calcification.

Skull: Normal. Negative for fracture or focal lesion.

Sinuses/Orbits: The sinuses are clear. No acute orbital abnormality.

Other: No mastoid effusions.
IMPRESSION: No evidence of acute intracranial abnormality.

## 2022-09-25 ENCOUNTER — Other Ambulatory Visit: Payer: Self-pay | Admitting: Nurse Practitioner

## 2022-09-25 DIAGNOSIS — B181 Chronic viral hepatitis B without delta-agent: Secondary | ICD-10-CM

## 2022-09-25 DIAGNOSIS — K7469 Other cirrhosis of liver: Secondary | ICD-10-CM

## 2022-10-23 ENCOUNTER — Other Ambulatory Visit: Payer: No Typology Code available for payment source

## 2022-11-07 ENCOUNTER — Ambulatory Visit
Admission: RE | Admit: 2022-11-07 | Discharge: 2022-11-07 | Disposition: A | Discharge status: Home / Self Care | Payer: No Typology Code available for payment source | Source: Ambulatory Visit | Attending: Nurse Practitioner | Admitting: Nurse Practitioner

## 2022-11-07 DIAGNOSIS — K7469 Other cirrhosis of liver: Secondary | ICD-10-CM

## 2022-11-07 DIAGNOSIS — B181 Chronic viral hepatitis B without delta-agent: Secondary | ICD-10-CM

## 2022-11-25 ENCOUNTER — Ambulatory Visit (HOSPITAL_COMMUNITY)
Admission: EM | Admit: 2022-11-25 | Discharge: 2022-11-25 | Disposition: A | Discharge status: Home / Self Care | Payer: No Typology Code available for payment source | Attending: Internal Medicine | Admitting: Internal Medicine

## 2022-11-25 ENCOUNTER — Encounter (HOSPITAL_COMMUNITY): Payer: Self-pay

## 2022-11-25 DIAGNOSIS — K5900 Constipation, unspecified: Secondary | ICD-10-CM

## 2022-11-25 DIAGNOSIS — M542 Cervicalgia: Secondary | ICD-10-CM | POA: Diagnosis not present

## 2022-11-25 DIAGNOSIS — G44209 Tension-type headache, unspecified, not intractable: Secondary | ICD-10-CM | POA: Diagnosis not present

## 2022-11-25 DIAGNOSIS — R079 Chest pain, unspecified: Secondary | ICD-10-CM

## 2022-11-25 MED ORDER — BACLOFEN 10 MG PO TABS: 10.0000 mg | ORAL_TABLET | Freq: Three times a day (TID) | ORAL | 0 refills | Status: DC

## 2022-11-25 MED ORDER — DICYCLOMINE HCL 20 MG PO TABS: 20.0000 mg | ORAL_TABLET | Freq: Two times a day (BID) | ORAL | 0 refills | Status: DC

## 2022-11-25 MED ORDER — ACETAMINOPHEN 325 MG PO TABS
ORAL_TABLET | ORAL | Status: AC
  Filled 2022-11-25: qty 2

## 2022-11-25 MED ORDER — ALUM & MAG HYDROXIDE-SIMETH 200-200-20 MG/5ML PO SUSP
30.0000 mL | Freq: Once | ORAL | Status: AC
  Administered 2022-11-25: 30 mL via ORAL

## 2022-11-25 MED ORDER — ACETAMINOPHEN 325 MG PO TABS
650.0000 mg | ORAL_TABLET | Freq: Once | ORAL | Status: AC
  Administered 2022-11-25: 650 mg via ORAL

## 2022-11-25 MED ORDER — LIDOCAINE VISCOUS HCL 2 % MT SOLN
OROMUCOSAL | Status: AC
  Filled 2022-11-25: qty 15

## 2022-11-25 MED ORDER — LIDOCAINE VISCOUS HCL 2 % MT SOLN
15.0000 mL | Freq: Once | OROMUCOSAL | Status: AC
  Administered 2022-11-25: 15 mL via OROMUCOSAL

## 2022-11-25 MED ORDER — ALUM & MAG HYDROXIDE-SIMETH 200-200-20 MG/5ML PO SUSP
ORAL | Status: AC
  Filled 2022-11-25: qty 30

## 2022-11-25 MED ORDER — LACTULOSE 10 GM/15ML PO SOLN: 10.0000 g | Freq: Every day | ORAL | 0 refills | Status: DC | PRN

## 2022-11-25 NOTE — ED Triage Notes (Signed)
Pt c/o constant headache causing numbness to rt side of face x2 days. Took tylenol with no relief.   Pt c/o intermittent sharp center chest pain shooting to back x2 days. Denies diaphoresis or SOB.  Pt c/o constipation x1wk. Took miralx with no relief.

## 2022-11-25 NOTE — ED Provider Notes (Signed)
[facility]    CSN: [id number] Arrival date & time: 11/25/22  1358      History   Chief Complaint Chief Complaint  Patient presents with   Headache   Chest Pain    HPI Brenda Cannon is a 42 y.o. female.   42 year old female presents due to concerns of headache, chest pain, and constipation.  She reports the headache started on Saturday morning.  She reports it being primarily on the right side of her neck and states it extends to the right side of her frontal lobe.  She also feels it behind her eye.  She states that pushing on her neck seems to cause the pain to worsen.  She works at the [employer] for a [employer], and states she worked this weekend.  She denies any injury or lifting trauma.  She denies any blurred vision or fever.  She states that the right side of her face feels tingly.  She denies any symptoms on the left.  She denies any change to facial movements.  No vision change. Patient also states around the same timeframe she started getting some intermittent chest discomfort.  She states she will get a sharp pain primarily to the center of her chest that will last 1 to 2 hours, then go away without any treatment.  She states it will come back usually an hour later.  She does have a history of GERD, but states this feels different.  She takes the medication daily for her acid reflux. Lastly, pt c/o constipation for the past week. Has been taking miralax without improvement. States she has passed BMs but has significant straining and only a tiny bit comes out.   Headache Chest Pain Associated symptoms: headache     Past Medical History:  Diagnosis Date   Abnormal Pap smear    ascus with High Risk for HPV   Carpal tunnel syndrome    Cirrhosis (HCC)    De Quervain's tenosynovitis, left    Difficulty reading    Gestational diabetes    GDM with last pregnancy (2008)   History of gestational diabetes    History of hepatitis B 07/08/2005   History  of malaria 07/08/2001   History of thrombocytopenia    Kidney stones    Obese    Plantar fasciitis     Patient Active Problem List   Diagnosis Date Noted   Papanicolaou smear of cervix with positive high risk human papilloma virus (HPV) test 11/08/2020   Carpal tunnel syndrome, bilateral 05/31/2020   Diabetes mellitus type 2 in obese 03/24/2015   Metatarsal stress fracture of left foot 11/10/2013   Allergic rhinitis 05/26/2012   Carpal tunnel syndrome of left wrist 04/15/2011   History of malaria 03/04/2011   Chronic hepatitis B (HCC) 03/01/2011   H/O: C-section 03/01/2011   History of thrombocytopenia 03/01/2011    Past Surgical History:  Procedure Laterality Date   CESAREAN SECTION  2008   last pregnacy   CESAREAN SECTION  08/21/2011   Procedure: CESAREAN SECTION;  Surgeon: Reva Bores, MD;  Location: [facility];  Service: Gynecology;  Laterality: N/A;    OB History     Gravida  5   Para  3   Term  3   Preterm  0   AB  2   Living  3      SAB  1   IAB      Ectopic  0   Multiple  0  Live Births  3            Home Medications    Prior to Admission medications   Medication Sig Start Date End Date Taking? Authorizing Provider  baclofen (LIORESAL) 10 MG tablet Take 1 tablet (10 mg total) by mouth 3 (three) times daily. 11/25/22  Yes Joshlynn Alfonzo L, PA  dicyclomine (BENTYL) 20 MG tablet Take 1 tablet (20 mg total) by mouth 2 (two) times daily. 11/25/22  Yes Izzabell Klasen L, PA  lactulose (CHRONULAC) 10 GM/15ML solution Take 15 mLs (10 g total) by mouth daily as needed for mild constipation. 11/25/22  Yes Dajane Valli L, PA  ergocalciferol (VITAMIN D2) 1.25 MG (50000 UT) capsule Take 1 capsule by mouth once a week. SUNDAY    [provider]  fluticasone (FLONASE) 50 MCG/ACT nasal spray Place 1 spray into both nostrils as needed for allergies or rhinitis.    [provider]  fluticasone (FLONASE) 50 MCG/ACT nasal spray Place 2 sprays  into both nostrils daily. 07/18/22   Gillermo Murdoch A, DO  gabapentin (NEURONTIN) 300 MG capsule Take 1 capsule (300 mg total) by mouth 3 (three) times daily as needed. 09/14/22 10/14/22  Terald Sleeper, MD  guaiFENesin (MUCINEX) 600 MG 12 hr tablet Take 1 tablet (600 mg total) by mouth 2 (two) times daily. 07/18/22   Gillermo Murdoch A, DO  ibuprofen (ADVIL) 400 MG tablet Take 1 tablet (400 mg total) by mouth every 8 (eight) hours as needed for up to 21 doses for mild pain or moderate pain. Take with food 09/14/22   Terald Sleeper, MD  Multiple Vitamins-Minerals (ALIVE WOMENS GUMMY PO) Take by mouth.    [provider]  omeprazole (PRILOSEC) 20 MG capsule Take 1 capsule (20 mg total) by mouth 2 (two) times daily before a meal. 01/17/22 02/16/22  Ellsworth Lennox, PA-C    Family History Family History  Problem Relation Age of Onset   Hypertension Mother    Stroke Mother    Colon cancer Neg Hx    Esophageal cancer Neg Hx    Liver cancer Neg Hx     Social History Social History   Tobacco Use   Smoking status: Never   Smokeless tobacco: Never  Vaping Use   Vaping Use: Never used  Substance Use Topics   Alcohol use: No   Drug use: No     Allergies   Cephalexin and Chloroquine   Review of Systems Review of Systems  Cardiovascular:  Positive for chest pain.  Neurological:  Positive for headaches.     Physical Exam Triage Vital Signs ED Triage Vitals  Enc Vitals Group     BP 11/25/22 1537 (!) 140/85     Pulse Rate 11/25/22 1537 61     Resp 11/25/22 1537 18     Temp 11/25/22 1537 98.1 F (36.7 C)     Temp Source 11/25/22 1537 Oral     SpO2 11/25/22 1537 99 %     Weight --      Height --      Head Circumference --      Peak Flow --      Pain Score 11/25/22 1538 8     Pain Loc --      Pain Edu? --      Excl. in GC? --    No data found.  Updated Vital Signs BP (!) 140/85 (BP Location: Left Arm)   Pulse 61   Temp 98.1 F (  36.7 C) (Oral)   Resp 18   LMP  11/22/2022   SpO2 99%   Visual Acuity Right Eye Distance:   Left Eye Distance:   Bilateral Distance:    Right Eye Near:   Left Eye Near:    Bilateral Near:     Physical Exam Vitals and nursing note reviewed.  Constitutional:      General: She is not in acute distress.    Appearance: Normal appearance. She is well-developed. She is obese. She is not ill-appearing, toxic-appearing or diaphoretic.  HENT:     Head: Normocephalic and atraumatic.     Right Ear: External ear normal.     Left Ear: External ear normal.     Mouth/Throat:     Mouth: Mucous membranes are moist.     Pharynx: No oropharyngeal exudate.  Eyes:     General: No scleral icterus.    Conjunctiva/sclera: Conjunctivae normal.     Pupils: Pupils are equal, round, and reactive to light.  Neck:     Vascular: No carotid bruit.  Cardiovascular:     Rate and Rhythm: Normal rate and regular rhythm.     Heart sounds: Normal heart sounds. No murmur heard.    No friction rub. No gallop.  Pulmonary:     Effort: Pulmonary effort is normal. No respiratory distress.     Breath sounds: Normal breath sounds. No stridor. No wheezing, rhonchi or rales.  Chest:     Chest wall: No tenderness.  Abdominal:     General: Abdomen is flat. There is no distension.     Palpations: Abdomen is soft.     Tenderness: There is no abdominal tenderness. There is no right CVA tenderness, left CVA tenderness, guarding or rebound.  Musculoskeletal:        General: No swelling.     Cervical back: Normal range of motion and neck supple. Tenderness (tense, knotted scalenes and traps, R>>>L) present. No rigidity.  Lymphadenopathy:     Cervical: No cervical adenopathy.  Skin:    General: Skin is warm and dry.     Capillary Refill: Capillary refill takes less than 2 seconds.     Coloration: Skin is not jaundiced.     Findings: No bruising, erythema or rash.  Neurological:     General: No focal deficit present.     Mental Status: She is alert  and oriented to person, place, and time.     Sensory: No sensory deficit.     Motor: No weakness.  Psychiatric:        Mood and Affect: Mood normal.      UC Treatments / Results  Labs (all labs ordered are listed, but only abnormal results are displayed) Labs Reviewed - No data to display  EKG   Radiology No results found.  Procedures ED EKG  Date/Time: 11/25/2022 6:25 PM  Performed by: Maretta Bees, PA Authorized by: Merrilee Jansky, MD   Previous ECG:    Previous ECG:  Compared to current   Similarity:  Changes noted   Comparison ECG info:  Non-specific T wave change Interpretation:    Interpretation: abnormal   Rate:    ECG rate:  56   ECG rate assessment: bradycardic   Rhythm:    Rhythm: sinus rhythm    (including critical care time)  Medications Ordered in UC Medications  alum & mag hydroxide-simeth (MAALOX/MYLANTA) 200-200-20 MG/5ML suspension 30 mL (30 mLs Oral Given 11/25/22 1713)  lidocaine (XYLOCAINE) 2 % viscous mouth  solution 15 mL (15 mLs Mouth/Throat Given 11/25/22 1713)  acetaminophen (TYLENOL) tablet 650 mg (650 mg Oral Given 11/25/22 1713)    Initial Impression / Assessment and Plan / UC Course  I have reviewed the triage vital signs and the nursing notes.  Pertinent labs & imaging results that were available during my care of the patient were reviewed by me and considered in my medical decision making (see chart for details).     Nonspecific chest pain -patient was seen in the emergency room for similar symptoms 3 months ago.  In the ER, she was given the GI cocktail with symptom improvement.  We gave that to her again today, and again states that the symptoms improved.  Her EKG does not show any significant concerning changes. VSS and pt is in no acute distress. She takes an over-the-counter acid reducer.  Discussed with patient that I suspect her GERD is not well-controlled on these medications and she may be experiencing intermittent  esophageal spasms.  Patient does have a gastroenterologist for which she follows, next office visit is in June.  I will defer any further evaluation for her GERD treatment to the specialist. Constipation -will give patient 15 mL lactulose as needed to help stimulate bowel movements.  Follow-up with GI for further recommendations. Trigger point of neck -patient has reproducible palpable tension in her neck and trap primarily on the right like the cause of her headache.  Recommended moist heat massage.  Will add baclofen to help with muscle tension and headaches in addition to possible spasms of her esophagus. Tension headache - pt cannot take NSAIDs or steroids due to her cirrhosis. Low dose tylenol can be used intermittently. Pt was given tylenol in office with improvement to headache.    Final Clinical Impressions(s) / UC Diagnoses   Final diagnoses:  Nonspecific chest pain  Constipation, unspecified constipation type  Trigger point of neck  Acute non intractable tension-type headache     Discharge Instructions      I suspect your chest pains may be related to uncontrolled GERD, which can lead to esophageal spasms.  Please start taking the baclofen up to 3 times daily as needed muscle tension and headaches. Use with caution as this can make you feel drowsy or sedated.  Please apply a moist warm compress to the back of your neck to help with your current headache.   If you continue to have intermittent esophageal pain, please take the dicyclomine up to twice daily. This can help with esophageal spasms. Please follow up with your GI specialist if your symptoms persist.  Regarding your bowel movements, take 15 mL of the lactulose once daily as needed.  Wait 24 hours to see if a bowel movement is produced prior to taking a second dose.  Use this on an as-needed basis only.       ED Prescriptions     Medication Sig Dispense Auth. Provider   dicyclomine (BENTYL) 20 MG tablet Take 1  tablet (20 mg total) by mouth 2 (two) times daily. 20 tablet Kollyn Lingafelter L, PA   baclofen (LIORESAL) 10 MG tablet Take 1 tablet (10 mg total) by mouth 3 (three) times daily. 30 each Osamu Olguin L, PA   lactulose (CHRONULAC) 10 GM/15ML solution Take 15 mLs (10 g total) by mouth daily as needed for mild constipation. 236 mL Yeilin Zweber L, PA      PDMP not reviewed this encounter.   Maretta Bees, [place] 11/25/22 [phone]

## 2022-11-25 NOTE — Discharge Instructions (Addendum)
I suspect your chest pains may be related to uncontrolled GERD, which can lead to esophageal spasms.  Please start taking the baclofen up to 3 times daily as needed muscle tension and headaches. Use with caution as this can make you feel drowsy or sedated.  Please apply a moist warm compress to the back of your neck to help with your current headache.   If you continue to have intermittent esophageal pain, please take the dicyclomine up to twice daily. This can help with esophageal spasms. Please follow up with your GI specialist if your symptoms persist.  Regarding your bowel movements, take 15 mL of the lactulose once daily as needed.  Wait 24 hours to see if a bowel movement is produced prior to taking a second dose.  Use this on an as-needed basis only.

## 2023-01-21 ENCOUNTER — Encounter (HOSPITAL_COMMUNITY): Payer: Self-pay

## 2023-01-21 ENCOUNTER — Ambulatory Visit (HOSPITAL_COMMUNITY)
Admission: EM | Admit: 2023-01-21 | Discharge: 2023-01-21 | Disposition: A | Discharge status: Home / Self Care | Payer: No Typology Code available for payment source | Attending: Nurse Practitioner | Admitting: Nurse Practitioner

## 2023-01-21 DIAGNOSIS — R5383 Other fatigue: Secondary | ICD-10-CM

## 2023-01-21 DIAGNOSIS — R42 Dizziness and giddiness: Secondary | ICD-10-CM | POA: Diagnosis not present

## 2023-01-21 LAB — BASIC METABOLIC PANEL
Anion gap: 9 (ref 5–15)
BUN: 9 mg/dL (ref 6–20)
CO2: 24 mmol/L (ref 22–32)
Calcium: 9 mg/dL (ref 8.9–10.3)
Chloride: 105 mmol/L (ref 98–111)
Creatinine, Ser: 0.69 mg/dL (ref 0.44–1.00)
GFR, Estimated: >60 mL/min (ref >60)
Glucose, Bld: 113 mg/dL — ABNORMAL HIGH (ref 70–99)
Potassium: 3.9 mmol/L (ref 3.5–5.1)
Sodium: 138 mmol/L (ref 135–145)

## 2023-01-21 LAB — TSH: TSH: 3.148 u[IU]/mL (ref 0.350–4.500)

## 2023-01-21 LAB — POCT URINALYSIS DIP (MANUAL ENTRY)
Bilirubin, UA: NEGATIVE
Glucose, UA: NEGATIVE mg/dL
Ketones, POC UA: NEGATIVE mg/dL
Leukocytes, UA: NEGATIVE
Nitrite, UA: NEGATIVE
Protein Ur, POC: NEGATIVE mg/dL
Spec Grav, UA: 1.025 (ref 1.010–1.025)
Urobilinogen, UA: 0.2 E.U./dL
pH, UA: 6.5 (ref 5.0–8.0)

## 2023-01-21 LAB — CBC
HCT: 39.2 % (ref 36.0–46.0)
Hemoglobin: 12.8 g/dL (ref 12.0–15.0)
MCH: 28.7 pg (ref 26.0–34.0)
MCHC: 32.7 g/dL (ref 30.0–36.0)
MCV: 87.9 fL (ref 80.0–100.0)
Platelets: 145 10*3/uL — ABNORMAL LOW (ref 150–400)
RBC: 4.46 MIL/uL (ref 3.87–5.11)
RDW: 12.8 % (ref 11.5–15.5)
WBC: 3.8 10*3/uL — ABNORMAL LOW (ref 4.0–10.5)
nRBC: 0 % (ref 0.0–0.2)

## 2023-01-21 LAB — POCT FASTING CBG KUC MANUAL ENTRY: POCT Glucose (KUC): 118 mg/dL — AB (ref 70–99)

## 2023-01-21 MED ORDER — MECLIZINE HCL 12.5 MG PO TABS: 12.5000 mg | ORAL_TABLET | Freq: Three times a day (TID) | ORAL | 0 refills | Status: DC | PRN

## 2023-01-21 NOTE — ED Provider Notes (Addendum)
[facility]    CSN: [id number] Arrival date & time: 01/21/23  1548      History   Chief Complaint Chief Complaint  Patient presents with   Fatigue    HPI Brenda Cannon is a 42 y.o. female.   Patient with history of prediabetes, cirrhosis, obesity, and chronic hepatitis B presents to urgent care for evaluation of generalized fatigue in the evenings and intermittent dizziness in the evenings that started approximately 7 days ago without known trigger.  She has found that when she lays down at nighttime she experiences what she describes as room spinning sensation.  She admits her sleep has not been very restful recently and sleeps for 2 to 3-hour stretches then wakes up.  No history of sleep apnea but she has never been screened for this before.  States she wakes up in the morning and her mouth is dry as if it has been open all night but she does not snore.  No history of vertigo.  No ear pain, tinnitus, or ear drainage.  She was recently on her menstrual cycle and states amount of bleeding was normal for her, no history of iron deficiency anemia.  No other abnormal bleeding from the rectum.  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 States she drinks plenty of water and denies frequent intake of urinary irritants/dehydrating beverages.  No chest pain, shortness of breath, syncope, nausea, vomiting, diarrhea, abdominal pain, urinary symptoms, vaginal symptoms, chance of pregnancy, vision changes, confusion, memory changes, fever/chills, or changes in appetite.  She eats 3 meals throughout the day and does not skip meals.  No history of thyroid problems or immunosuppression.  She has not attempted use of any over-the-counter medications to help with symptoms before coming to urgent care.     Past Medical History:  Diagnosis Date   Abnormal Pap smear    ascus with High Risk for HPV   Carpal tunnel syndrome    Cirrhosis (HCC)    De  Quervain's tenosynovitis, left    Difficulty reading    Gestational diabetes    GDM with last pregnancy (2008)   History of gestational diabetes    History of hepatitis B 07/08/2005   History of malaria 07/08/2001   History of thrombocytopenia    Kidney stones    Obese    Plantar fasciitis     Patient Active Problem List   Diagnosis Date Noted   Papanicolaou smear of cervix with positive high risk human papilloma virus (HPV) test 11/08/2020   Carpal tunnel syndrome, bilateral 05/31/2020   Diabetes mellitus type 2 in obese 03/24/2015   Metatarsal stress fracture of left foot 11/10/2013   Allergic rhinitis 05/26/2012   Carpal tunnel syndrome of left wrist 04/15/2011   History of malaria 03/04/2011   Chronic hepatitis B (HCC) 03/01/2011   H/O: C-section 03/01/2011   History of thrombocytopenia 03/01/2011    Past Surgical History:  Procedure Laterality Date   CESAREAN SECTION  2008   last pregnacy   CESAREAN SECTION  08/21/2011   Procedure: CESAREAN SECTION;  Surgeon: Reva Bores, MD;  Location: [facility];  Service: Gynecology;  Laterality: N/A;    OB History     Gravida  5   Para  3   Term  3   Preterm  0   AB  2   Living  3      SAB  1   IAB  Ectopic  0   Multiple  0   Live Births  3            Home Medications    Prior to Admission medications   Medication Sig Start Date End Date Taking? Authorizing Provider  meclizine (ANTIVERT) 12.5 MG tablet Take 1 tablet (12.5 mg total) by mouth 3 (three) times daily as needed for dizziness. 01/21/23  Yes Carlisle Beers, FNP  baclofen (LIORESAL) 10 MG tablet Take 1 tablet (10 mg total) by mouth 3 (three) times daily. 11/25/22   Crain, Whitney L, PA  dicyclomine (BENTYL) 20 MG tablet Take 1 tablet (20 mg total) by mouth 2 (two) times daily. 11/25/22   Crain, Whitney L, PA  ergocalciferol (VITAMIN D2) 1.25 MG (50000 UT) capsule Take 1 capsule by mouth once a week. SUNDAY    [provider]   fluticasone (FLONASE) 50 MCG/ACT nasal spray Place 1 spray into both nostrils as needed for allergies or rhinitis.    [provider]  fluticasone (FLONASE) 50 MCG/ACT nasal spray Place 2 sprays into both nostrils daily. 07/18/22   Gillermo Murdoch A, DO  gabapentin (NEURONTIN) 300 MG capsule Take 1 capsule (300 mg total) by mouth 3 (three) times daily as needed. 09/14/22 10/14/22  Terald Sleeper, MD  guaiFENesin (MUCINEX) 600 MG 12 hr tablet Take 1 tablet (600 mg total) by mouth 2 (two) times daily. 07/18/22   Gillermo Murdoch A, DO  ibuprofen (ADVIL) 400 MG tablet Take 1 tablet (400 mg total) by mouth every 8 (eight) hours as needed for up to 21 doses for mild pain or moderate pain. Take with food 09/14/22   Terald Sleeper, MD  lactulose (CHRONULAC) 10 GM/15ML solution Take 15 mLs (10 g total) by mouth daily as needed for mild constipation. 11/25/22   Crain, Alphonzo Lemmings L, PA  Multiple Vitamins-Minerals (ALIVE WOMENS GUMMY PO) Take by mouth.    [provider]  omeprazole (PRILOSEC) 20 MG capsule Take 1 capsule (20 mg total) by mouth 2 (two) times daily before a meal. 01/17/22 02/16/22  Ellsworth Lennox, PA-C    Family History Family History  Problem Relation Age of Onset   Hypertension Mother    Stroke Mother    Colon cancer Neg Hx    Esophageal cancer Neg Hx    Liver cancer Neg Hx     Social History Social History   Tobacco Use   Smoking status: Never   Smokeless tobacco: Never  Vaping Use   Vaping status: Never Used  Substance Use Topics   Alcohol use: No   Drug use: No     Allergies   Cephalexin and Chloroquine   Review of Systems Review of Systems Per HPI  Physical Exam Triage Vital Signs ED Triage Vitals  Encounter Vitals Group     BP 01/21/23 1619 135/88     Systolic BP Percentile --      Diastolic BP Percentile --      Pulse Rate 01/21/23 1619 (!) 59     Resp 01/21/23 1619 16     Temp 01/21/23 1619 98 F (36.7 C)     Temp Source 01/21/23 1619  Oral     SpO2 01/21/23 1619 97 %     Weight --      Height --      Head Circumference --      Peak Flow --      Pain Score 01/21/23 1620 0     Pain Loc --  Pain Education --      Exclude from Growth Chart --    Orthostatic VS for the past 24 hrs:  BP- Lying Pulse- Lying BP- Sitting Pulse- Sitting BP- Standing at 0 minutes Pulse- Standing at 0 minutes  01/21/23 1700 (!) 152/97 60 144/74 62 (!) 152/110 70    Updated Vital Signs BP 135/88 (BP Location: Right Arm)   Pulse (!) 59   Temp 98 F (36.7 C) (Oral)   Resp 16   LMP 01/15/2023 (Approximate)   SpO2 97%   Visual Acuity Right Eye Distance:   Left Eye Distance:   Bilateral Distance:    Right Eye Near:   Left Eye Near:    Bilateral Near:     Physical Exam Vitals and nursing note reviewed.  Constitutional:      Appearance: She is obese. She is not ill-appearing or toxic-appearing.  HENT:     Head: Normocephalic and atraumatic.     Right Ear: Hearing, tympanic membrane, ear canal and external ear normal.     Left Ear: Hearing, tympanic membrane, ear canal and external ear normal.     Nose: Nose normal.     Mouth/Throat:     Lips: Pink.     Mouth: Mucous membranes are moist. No injury.     Tongue: No lesions. Tongue does not deviate from midline.     Palate: No mass and lesions.     Pharynx: Oropharynx is clear. Uvula midline. No pharyngeal swelling, oropharyngeal exudate, posterior oropharyngeal erythema or uvula swelling.     Tonsils: No tonsillar exudate or tonsillar abscesses.  Eyes:     General: Lids are normal. Vision grossly intact. Gaze aligned appropriately.     Extraocular Movements: Extraocular movements intact.     Conjunctiva/sclera: Conjunctivae normal.  Cardiovascular:     Rate and Rhythm: Normal rate and regular rhythm.     Heart sounds: Normal heart sounds, S1 normal and S2 normal.  Pulmonary:     Effort: Pulmonary effort is normal. No respiratory distress.     Breath sounds: Normal breath  sounds and air entry.  Abdominal:     General: Bowel sounds are normal.     Palpations: Abdomen is soft.     Tenderness: There is no abdominal tenderness. There is no right CVA tenderness, left CVA tenderness or guarding.  Musculoskeletal:     Cervical back: Neck supple.  Lymphadenopathy:     Cervical: No cervical adenopathy.  Skin:    General: Skin is warm and dry.     Capillary Refill: Capillary refill takes less than 2 seconds.     Findings: No rash.  Neurological:     General: No focal deficit present.     Mental Status: She is alert and oriented to person, place, and time. Mental status is at baseline.     Cranial Nerves: Cranial nerves 2-12 are intact. No cranial nerve deficit, dysarthria or facial asymmetry.     Sensory: Sensation is intact.     Motor: Motor function is intact. No weakness or pronator drift.     Coordination: Coordination is intact. Romberg sign negative.     Gait: Gait is intact.     Comments: Strength and sensation intact to bilateral upper and lower extremities (5/5). Moves all 4 extremities with normal coordination voluntarily. Non-focal neuro exam.   Psychiatric:        Mood and Affect: Mood normal.        Speech: Speech normal.  Behavior: Behavior normal.        Thought Content: Thought content normal.        Judgment: Judgment normal.      UC Treatments / Results  Labs (all labs ordered are listed, but only abnormal results are displayed) Labs Reviewed  POCT FASTING CBG [facility] MANUAL ENTRY - Abnormal; Notable for the following components:      Result Value   POCT Glucose ([facility]) 118 (*)    All other components within normal limits  POCT URINALYSIS DIP (MANUAL ENTRY) - Abnormal; Notable for the following components:   Blood, UA trace-intact (*)    All other components within normal limits  CBC  BASIC METABOLIC PANEL  TSH    EKG   Radiology No results found.  Procedures Procedures (including critical care time)  Medications  Ordered in UC Medications - No data to display  Initial Impression / Assessment and Plan / UC Course  I have reviewed the triage vital signs and the nursing notes.  Pertinent labs & imaging results that were available during my care of the patient were reviewed by me and considered in my medical decision making (see chart for details).   1.  Other fatigue, dizziness Unclear etiology of patient's symptoms.  Question sleep apnea etiology as she describes waking up with dry mouth sensation, is very fatigued throughout the day, and wakes up frequently throughout the night during sleep.  This has been an ongoing problem for her.  Advised follow-up with PCP for sleep apnea screening.  EKG shows normal sinus rhythm with normal intervals and without ST/T wave changes.  Previous EKG and today's EKG shows similar findings.  Doubt cardiac etiology.  Lungs clear, therefore deferred imaging.  Urinalysis is unremarkable for signs of urinary tract infection. CBG in clinic is 118 nonfasting. Orthostatic vital signs are normal.  Basic blood work including CBC, BMP, and TSH drawn for further investigation and to rule out electrolyte abnormality, anemia, and thyroid problem. Will call patient if blood work is abnormal.  No indication for referral to the emergency department for further workup and evaluation today as there is low suspicion for emergent causes of symptoms.  Neurologic exam is normal and without focal deficit.  Encouraged to continue drinking plenty of water to stay well-hydrated and follow-up with PCP in the next 3 to 5 days for further evaluation.  Counseled patient on potential for adverse effects with medications prescribed/recommended today, strict ER and return-to-clinic precautions discussed, patient verbalized understanding.    Final Clinical Impressions(s) / UC Diagnoses   Final diagnoses:  Other fatigue  Dizziness     Discharge Instructions      It is unclear what is  causing your symptoms today but your workup is very reassuring.  Your EKG is normal and so is your urine.  Sugar is normal as well.  Vital signs look great.  We have drawn blood work today and I will call you if anything is abnormal.  You may take meclizine 12.5 mg every 8 hours as needed for dizziness. Do not take this medicine and go to work, drive, or drink alcohol as it can make you very sleepy.  Mostly take this medicine at nighttime.  Please schedule an appointment to follow-up with your primary care provider in the next 1 to 2 weeks for further workup and evaluation of this as I believe that you may benefit from a sleep study to rule out sleep apnea.  If you develop any new or worsening  symptoms or if your symptoms do not start to improve, pleases return here or follow-up with your primary care provider. If your symptoms are severe, please go to the emergency room.     ED Prescriptions     Medication Sig Dispense Auth. Provider   meclizine (ANTIVERT) 12.5 MG tablet Take 1 tablet (12.5 mg total) by mouth 3 (three) times daily as needed for dizziness. 30 tablet Carlisle Beers, FNP      PDMP not reviewed this encounter.   Carlisle Beers, FNP 01/21/23 1803    Carlisle Beers, FNP 01/21/23 [phone]

## 2023-01-21 NOTE — ED Triage Notes (Signed)
Pt states for the past week she is feeling fatigued at night with dizziness.

## 2023-01-21 NOTE — Discharge Instructions (Addendum)
It is unclear what is causing your symptoms today but your workup is very reassuring.  Your EKG is normal and so is your urine.  Sugar is normal as well.  Vital signs look great.  We have drawn blood work today and I will call you if anything is abnormal.  You may take meclizine 12.5 mg every 8 hours as needed for dizziness. Do not take this medicine and go to work, drive, or drink alcohol as it can make you very sleepy.  Mostly take this medicine at nighttime.  Please schedule an appointment to follow-up with your primary care provider in the next 1 to 2 weeks for further workup and evaluation of this as I believe that you may benefit from a sleep study to rule out sleep apnea.  If you develop any new or worsening symptoms or if your symptoms do not start to improve, pleases return here or follow-up with your primary care provider. If your symptoms are severe, please go to the emergency room.

## 2023-04-01 ENCOUNTER — Emergency Department (HOSPITAL_COMMUNITY): Payer: No Typology Code available for payment source

## 2023-04-01 ENCOUNTER — Other Ambulatory Visit: Payer: Self-pay

## 2023-04-01 ENCOUNTER — Emergency Department (HOSPITAL_COMMUNITY)
Admission: EM | Admit: 2023-04-01 | Discharge: 2023-04-02 | Disposition: A | Discharge status: Home / Self Care | Payer: No Typology Code available for payment source | Attending: Emergency Medicine | Admitting: Emergency Medicine

## 2023-04-01 ENCOUNTER — Other Ambulatory Visit: Payer: Self-pay | Admitting: Nurse Practitioner

## 2023-04-01 ENCOUNTER — Encounter (HOSPITAL_COMMUNITY): Payer: Self-pay

## 2023-04-01 DIAGNOSIS — R0602 Shortness of breath: Secondary | ICD-10-CM | POA: Diagnosis not present

## 2023-04-01 DIAGNOSIS — E119 Type 2 diabetes mellitus without complications: Secondary | ICD-10-CM | POA: Insufficient documentation

## 2023-04-01 DIAGNOSIS — R0789 Other chest pain: Secondary | ICD-10-CM | POA: Insufficient documentation

## 2023-04-01 DIAGNOSIS — K7469 Other cirrhosis of liver: Secondary | ICD-10-CM

## 2023-04-01 DIAGNOSIS — B181 Chronic viral hepatitis B without delta-agent: Secondary | ICD-10-CM

## 2023-04-01 DIAGNOSIS — R011 Cardiac murmur, unspecified: Secondary | ICD-10-CM | POA: Insufficient documentation

## 2023-04-01 LAB — CBC
HCT: 38.5 % (ref 36.0–46.0)
Hemoglobin: 12.4 g/dL (ref 12.0–15.0)
MCH: 28.8 pg (ref 26.0–34.0)
MCHC: 32.2 g/dL (ref 30.0–36.0)
MCV: 89.3 fL (ref 80.0–100.0)
Platelets: 112 10*3/uL — ABNORMAL LOW (ref 150–400)
RBC: 4.31 MIL/uL (ref 3.87–5.11)
RDW: 12.9 % (ref 11.5–15.5)
WBC: 3.7 10*3/uL — ABNORMAL LOW (ref 4.0–10.5)
nRBC: 0 % (ref 0.0–0.2)

## 2023-04-01 LAB — BASIC METABOLIC PANEL
Anion gap: 9 (ref 5–15)
BUN: 13 mg/dL (ref 6–20)
CO2: 25 mmol/L (ref 22–32)
Calcium: 9.3 mg/dL (ref 8.9–10.3)
Chloride: 105 mmol/L (ref 98–111)
Creatinine, Ser: 0.59 mg/dL (ref 0.44–1.00)
GFR, Estimated: >60 mL/min (ref >60)
Glucose, Bld: 169 mg/dL — ABNORMAL HIGH (ref 70–99)
Potassium: 3.8 mmol/L (ref 3.5–5.1)
Sodium: 139 mmol/L (ref 135–145)

## 2023-04-01 LAB — TROPONIN I (HIGH SENSITIVITY)
Troponin I (High Sensitivity): 3 ng/L (ref <18)
Troponin I (High Sensitivity): 4 ng/L (ref <18)

## 2023-04-01 LAB — HCG, SERUM, QUALITATIVE: Preg, Serum: NEGATIVE

## 2023-04-01 MED ORDER — IOHEXOL 350 MG/ML SOLN
100.0000 mL | Freq: Once | INTRAVENOUS | Status: AC | PRN
  Administered 2023-04-01: 100 mL via INTRAVENOUS

## 2023-04-01 NOTE — ED Provider Notes (Incomplete)
This is a 42 year old female history diabetes presenting for chest pain.  She describes it as a sharp pain that intermittently radiates to her back.  It started about 2 weeks ago and was sudden onset.  She was seen in urgent care diagnosed with new heart murmur and told to come here.  She states the pain is different than her prior GERD and does somewhat radiate to her back.  I did a bedside ultrasound without effusion and shows good EF.  Unable to well visualize the aorta.  Her chest x-ray is unremarkable, troponin is negative as well.  She is low risk for ACS, however given her new murmur with chest pain rating to her back I think it is worth ruling out dissection with a CT scan.  Plan for following up workup at the end my shift.

## 2023-04-01 NOTE — ED Provider Notes (Signed)
[place] [facility] Provider Note   CSN: [id number] Arrival date & time: 04/01/23  1729     History {Add pertinent medical, surgical, social history, OB history to HPI:1} Chief Complaint  Patient presents with   Chest Pain    Brenda Cannon is a 42 y.o. female.  42 year old female with history of GERD, T2D, chronic hep B presents to the ED with complaint of central and left sided sharp anterior chest discomfort onset 2 weeks ago. Occasional mild shortness of breath. No cough. No fever or chills. During her GI visit today, she was noted to have a new murmur. No nausea or vomiting. Discomfort is temporarily improved with tylenol and gas reliever.   The history is provided by the patient and medical records. No language interpreter was used.  Chest Pain Pain location:  L chest Pain quality: sharp   Pain radiates to:  Mid back Pain severity:  Moderate Onset quality:  Gradual Duration:  2 weeks Timing:  Intermittent Progression:  Waxing and waning Chronicity:  Recurrent Associated symptoms: no abdominal pain, no cough, no nausea, no PND and no vomiting   Risk factors: diabetes mellitus        Home Medications Prior to Admission medications   Medication Sig Start Date End Date Taking? Authorizing Provider  baclofen (LIORESAL) 10 MG tablet Take 1 tablet (10 mg total) by mouth 3 (three) times daily. 11/25/22   Crain, Whitney L, PA  dicyclomine (BENTYL) 20 MG tablet Take 1 tablet (20 mg total) by mouth 2 (two) times daily. 11/25/22   Crain, Whitney L, PA  ergocalciferol (VITAMIN D2) 1.25 MG (50000 UT) capsule Take 1 capsule by mouth once a week. SUNDAY    [provider]  fluticasone (FLONASE) 50 MCG/ACT nasal spray Place 1 spray into both nostrils as needed for allergies or rhinitis.    [provider]  fluticasone (FLONASE) 50 MCG/ACT nasal spray Place 2 sprays into both nostrils daily. 07/18/22   Gillermo Murdoch A, DO   gabapentin (NEURONTIN) 300 MG capsule Take 1 capsule (300 mg total) by mouth 3 (three) times daily as needed. 09/14/22 10/14/22  Terald Sleeper, MD  guaiFENesin (MUCINEX) 600 MG 12 hr tablet Take 1 tablet (600 mg total) by mouth 2 (two) times daily. 07/18/22   Gillermo Murdoch A, DO  ibuprofen (ADVIL) 400 MG tablet Take 1 tablet (400 mg total) by mouth every 8 (eight) hours as needed for up to 21 doses for mild pain or moderate pain. Take with food 09/14/22   Terald Sleeper, MD  lactulose (CHRONULAC) 10 GM/15ML solution Take 15 mLs (10 g total) by mouth daily as needed for mild constipation. 11/25/22   Crain, Whitney L, PA  meclizine (ANTIVERT) 12.5 MG tablet Take 1 tablet (12.5 mg total) by mouth 3 (three) times daily as needed for dizziness. 01/21/23   Carlisle Beers, FNP  Multiple Vitamins-Minerals (ALIVE WOMENS GUMMY PO) Take by mouth.    [provider]  omeprazole (PRILOSEC) 20 MG capsule Take 1 capsule (20 mg total) by mouth 2 (two) times daily before a meal. 01/17/22 02/16/22  Ellsworth Lennox, PA-C      Allergies    Cephalexin and Chloroquine    Review of Systems   Review of Systems  Respiratory:  Negative for cough.   Cardiovascular:  Positive for chest pain. Negative for PND.  Gastrointestinal:  Negative for abdominal pain, nausea and vomiting.  All other systems reviewed and  are negative.   Physical Exam Updated Vital Signs BP (!) 145/92   Pulse 65   Temp 98.9 F (37.2 C) (Oral)   Resp 18   Ht 5\' 1"  (1.549 m)   Wt 88 kg   SpO2 98%   BMI 36.66 kg/m  Physical Exam Vitals reviewed.  Constitutional:      Appearance: She is well-developed.  HENT:     Head: Normocephalic.  Cardiovascular:     Rate and Rhythm: Normal rate.     Heart sounds: Murmur heard.  Pulmonary:     Effort: Pulmonary effort is normal.     Breath sounds: Normal breath sounds.  Abdominal:     Palpations: Abdomen is soft.  Musculoskeletal:        General: Normal range of motion.      Cervical back: Normal range of motion.  Skin:    General: Skin is warm and dry.  Neurological:     Mental Status: She is alert and oriented to person, place, and time.  Psychiatric:        Mood and Affect: Mood normal.        Behavior: Behavior normal.     ED Results / Procedures / Treatments   Labs (all labs ordered are listed, but only abnormal results are displayed) Labs Reviewed  BASIC METABOLIC PANEL - Abnormal; Notable for the following components:      Result Value   Glucose, Bld 169 (*)    All other components within normal limits  CBC - Abnormal; Notable for the following components:   WBC 3.7 (*)    Platelets 112 (*)    All other components within normal limits  HCG, SERUM, QUALITATIVE  TROPONIN I (HIGH SENSITIVITY)  TROPONIN I (HIGH SENSITIVITY)    EKG None  Radiology DG Chest 2 View  Result Date: 04/01/2023 CLINICAL DATA:  Left-sided chest pain. EXAM: CHEST - 2 VIEW COMPARISON:  12/15/2021 FINDINGS: The cardiomediastinal contours are normal. The lungs are clear. Pulmonary vasculature is normal. No consolidation, pleural effusion, or pneumothorax. No acute osseous abnormalities are seen. IMPRESSION: Negative radiographs of the chest. Electronically Signed   By: Narda Rutherford M.D.   On: 04/01/2023 18:45    Procedures Procedures  {Document cardiac monitor, telemetry assessment procedure when appropriate:1}  Medications Ordered in ED Medications - No data to display  ED Course/ Medical Decision Making/ A&P  Discussed patient with Dr. Earlene Plater. Symptoms are concerning for possible dissection--will order dissection study. {   Click here for ABCD2, HEART and other calculatorsREFRESH Note before signing :1}                              Medical Decision Making  ***  {Document critical care time when appropriate:1} {Document review of labs and clinical decision tools ie heart score, Chads2Vasc2 etc:1}  {Document your independent review of radiology images, and  any outside records:1} {Document your discussion with family members, caretakers, and with consultants:1} {Document social determinants of health affecting pt's care:1} {Document your decision making why or why not admission, treatments were needed:1} Final Clinical Impression(s) / ED Diagnoses Final diagnoses:  None    Rx / DC Orders ED Discharge Orders     None

## 2023-04-01 NOTE — ED Triage Notes (Signed)
Patient has had left sided chest pain for 2 weeks. No nausea or vomiting. Patient was told she has a heart murmur at urgent care.

## 2023-04-02 NOTE — Discharge Instructions (Addendum)
Please refer to the attached instructions. Follow-up with your primary care provider.

## 2023-05-05 ENCOUNTER — Ambulatory Visit
Admission: RE | Admit: 2023-05-05 | Discharge: 2023-05-05 | Disposition: A | Discharge status: Home / Self Care | Payer: Medicaid Other | Source: Ambulatory Visit | Attending: Nurse Practitioner | Admitting: Nurse Practitioner

## 2023-05-05 DIAGNOSIS — B181 Chronic viral hepatitis B without delta-agent: Secondary | ICD-10-CM

## 2023-05-05 DIAGNOSIS — K7469 Other cirrhosis of liver: Secondary | ICD-10-CM

## 2023-05-14 ENCOUNTER — Encounter: Payer: Self-pay | Admitting: Gastroenterology

## 2023-05-28 ENCOUNTER — Emergency Department (HOSPITAL_COMMUNITY): Payer: Medicaid Other

## 2023-05-28 ENCOUNTER — Emergency Department (HOSPITAL_COMMUNITY)
Admission: EM | Admit: 2023-05-28 | Discharge: 2023-05-28 | Disposition: A | Discharge status: Home / Self Care | Payer: Medicaid Other | Attending: Emergency Medicine | Admitting: Emergency Medicine

## 2023-05-28 ENCOUNTER — Encounter (HOSPITAL_COMMUNITY): Payer: Self-pay

## 2023-05-28 DIAGNOSIS — R109 Unspecified abdominal pain: Secondary | ICD-10-CM | POA: Insufficient documentation

## 2023-05-28 LAB — URINALYSIS, ROUTINE W REFLEX MICROSCOPIC
Bilirubin Urine: NEGATIVE
Glucose, UA: NEGATIVE mg/dL
Hgb urine dipstick: NEGATIVE
Ketones, ur: NEGATIVE mg/dL
Leukocytes,Ua: NEGATIVE
Nitrite: NEGATIVE
Protein, ur: NEGATIVE mg/dL
Specific Gravity, Urine: 1.012 (ref 1.005–1.030)
pH: 6 (ref 5.0–8.0)

## 2023-05-28 LAB — CBC WITH DIFFERENTIAL/PLATELET
Abs Immature Granulocytes: 0 10*3/uL (ref 0.00–0.07)
Basophils Absolute: 0 10*3/uL (ref 0.0–0.1)
Basophils Relative: 0 %
Eosinophils Absolute: 0 10*3/uL (ref 0.0–0.5)
Eosinophils Relative: 1 %
HCT: 39.7 % (ref 36.0–46.0)
Hemoglobin: 13 g/dL (ref 12.0–15.0)
Immature Granulocytes: 0 %
Lymphocytes Relative: 35 %
Lymphs Abs: 1.2 10*3/uL (ref 0.7–4.0)
MCH: 29.3 pg (ref 26.0–34.0)
MCHC: 32.7 g/dL (ref 30.0–36.0)
MCV: 89.6 fL (ref 80.0–100.0)
Monocytes Absolute: 0.4 10*3/uL (ref 0.1–1.0)
Monocytes Relative: 10 %
Neutro Abs: 1.9 10*3/uL (ref 1.7–7.7)
Neutrophils Relative %: 54 %
Platelets: 131 10*3/uL — ABNORMAL LOW (ref 150–400)
RBC: 4.43 MIL/uL (ref 3.87–5.11)
RDW: 12.9 % (ref 11.5–15.5)
WBC: 3.4 10*3/uL — ABNORMAL LOW (ref 4.0–10.5)
nRBC: 0 % (ref 0.0–0.2)

## 2023-05-28 LAB — COMPREHENSIVE METABOLIC PANEL
ALT: 20 U/L (ref 0–44)
AST: 20 U/L (ref 15–41)
Albumin: 4.1 g/dL (ref 3.5–5.0)
Alkaline Phosphatase: 47 U/L (ref 38–126)
Anion gap: 7 (ref 5–15)
BUN: 19 mg/dL (ref 6–20)
CO2: 23 mmol/L (ref 22–32)
Calcium: 9.2 mg/dL (ref 8.9–10.3)
Chloride: 104 mmol/L (ref 98–111)
Creatinine, Ser: 0.64 mg/dL (ref 0.44–1.00)
GFR, Estimated: >60 mL/min (ref >60)
Glucose, Bld: 98 mg/dL (ref 70–99)
Potassium: 3.9 mmol/L (ref 3.5–5.1)
Sodium: 134 mmol/L — ABNORMAL LOW (ref 135–145)
Total Bilirubin: 0.7 mg/dL (ref <1.2)
Total Protein: 7.3 g/dL (ref 6.5–8.1)

## 2023-05-28 LAB — HCG, SERUM, QUALITATIVE: Preg, Serum: NEGATIVE

## 2023-05-28 MED ORDER — MORPHINE SULFATE (PF) 4 MG/ML IV SOLN
4.0000 mg | Freq: Once | INTRAVENOUS | Status: AC
  Administered 2023-05-28: 4 mg via INTRAVENOUS
  Filled 2023-05-28: qty 1

## 2023-05-28 MED ORDER — ONDANSETRON HCL 4 MG/2ML IJ SOLN
4.0000 mg | Freq: Once | INTRAMUSCULAR | Status: AC
  Administered 2023-05-28: 4 mg via INTRAVENOUS
  Filled 2023-05-28: qty 2

## 2023-05-28 MED ORDER — DICYCLOMINE HCL 20 MG PO TABS: 20.0000 mg | ORAL_TABLET | Freq: Two times a day (BID) | ORAL | 0 refills | Status: DC

## 2023-05-28 MED ORDER — SODIUM CHLORIDE 0.9 % IV BOLUS
500.0000 mL | Freq: Once | INTRAVENOUS | Status: AC
  Administered 2023-05-28: 500 mL via INTRAVENOUS

## 2023-05-28 MED ORDER — ONDANSETRON 4 MG PO TBDP: 4.0000 mg | ORAL_TABLET | Freq: Three times a day (TID) | ORAL | 0 refills | Status: DC | PRN

## 2023-05-28 NOTE — Discharge Instructions (Signed)
Your workup today including blood work, CT scan, urine did not show any concerns infection, or other concerning findings to explain your flank pain.  Follow-up with your primary care provider.  I have sent in a couple medications to the pharmacy for you for symptom management.  Return for any concerning symptoms.

## 2023-05-28 NOTE — ED Provider Notes (Signed)
[place] EMERGENCY DEPARTMENT AT [facility] Provider Note   CSN: [id number] Arrival date & time: 05/28/23  1324     History  Chief Complaint  Patient presents with   Dysuria   Flank Pain    Brenda Cannon is a 42 y.o. female.  42 year old female presents today for concern of right-sided flank pain with associated dysuria.  Symptoms started yesterday.  She has history of kidney stones.  No nausea, vomiting, or hematuria.  No abdominal pain.  Denies other complaints.  The history is provided by the patient. No language interpreter was used.  Flank Pain Pertinent negatives include no abdominal pain.       Home Medications Prior to Admission medications   Medication Sig Start Date End Date Taking? Authorizing Provider  baclofen (LIORESAL) 10 MG tablet Take 1 tablet (10 mg total) by mouth 3 (three) times daily. 11/25/22   Crain, Whitney L, PA  dicyclomine (BENTYL) 20 MG tablet Take 1 tablet (20 mg total) by mouth 2 (two) times daily. 11/25/22   Crain, Whitney L, PA  ergocalciferol (VITAMIN D2) 1.25 MG (50000 UT) capsule Take 1 capsule by mouth once a week. SUNDAY    [provider]  fluticasone (FLONASE) 50 MCG/ACT nasal spray Place 1 spray into both nostrils as needed for allergies or rhinitis.    [provider]  fluticasone (FLONASE) 50 MCG/ACT nasal spray Place 2 sprays into both nostrils daily. 07/18/22   Gillermo Murdoch A, DO  gabapentin (NEURONTIN) 300 MG capsule Take 1 capsule (300 mg total) by mouth 3 (three) times daily as needed. 09/14/22 10/14/22  Terald Sleeper, MD  guaiFENesin (MUCINEX) 600 MG 12 hr tablet Take 1 tablet (600 mg total) by mouth 2 (two) times daily. 07/18/22   Gillermo Murdoch A, DO  ibuprofen (ADVIL) 400 MG tablet Take 1 tablet (400 mg total) by mouth every 8 (eight) hours as needed for up to 21 doses for mild pain or moderate pain. Take with food 09/14/22   Terald Sleeper, MD  lactulose (CHRONULAC) 10 GM/15ML solution  Take 15 mLs (10 g total) by mouth daily as needed for mild constipation. 11/25/22   Crain, Whitney L, PA  meclizine (ANTIVERT) 12.5 MG tablet Take 1 tablet (12.5 mg total) by mouth 3 (three) times daily as needed for dizziness. 01/21/23   Carlisle Beers, FNP  Multiple Vitamins-Minerals (ALIVE WOMENS GUMMY PO) Take by mouth.    [provider]  omeprazole (PRILOSEC) 20 MG capsule Take 1 capsule (20 mg total) by mouth 2 (two) times daily before a meal. 01/17/22 02/16/22  Ellsworth Lennox, PA-C      Allergies    Cephalexin and Chloroquine    Review of Systems   Review of Systems  Constitutional:  Negative for chills and fever.  Gastrointestinal:  Negative for abdominal pain, nausea and vomiting.  Genitourinary:  Positive for dysuria and flank pain. Negative for difficulty urinating.  Neurological:  Negative for light-headedness.  All other systems reviewed and are negative.   Physical Exam Updated Vital Signs BP (!) 123/97   Pulse (!) 48   Temp 98.6 F (37 C) (Oral)   Resp 18   Ht 5\' 1"  (1.549 m)   Wt 88 kg   SpO2 100%   BMI 36.66 kg/m  Physical Exam Vitals and nursing note reviewed.  Constitutional:      General: She is not in acute distress.    Appearance: Normal appearance. She is not ill-appearing.  HENT:  Head: Normocephalic and atraumatic.     Nose: Nose normal.  Eyes:     Conjunctiva/sclera: Conjunctivae normal.  Pulmonary:     Effort: Pulmonary effort is normal. No respiratory distress.  Musculoskeletal:        General: No deformity.  Skin:    Findings: No rash.  Neurological:     Mental Status: She is alert.     ED Results / Procedures / Treatments   Labs (all labs ordered are listed, but only abnormal results are displayed) Labs Reviewed  URINALYSIS, ROUTINE W REFLEX MICROSCOPIC - Abnormal; Notable for the following components:      Result Value   Color, Urine STRAW (*)    All other components within normal limits  CBC WITH  DIFFERENTIAL/PLATELET - Abnormal; Notable for the following components:   WBC 3.4 (*)    Platelets 131 (*)    All other components within normal limits  COMPREHENSIVE METABOLIC PANEL - Abnormal; Notable for the following components:   Sodium 134 (*)    All other components within normal limits  HCG, SERUM, QUALITATIVE    EKG None  Radiology CT Renal Stone Study  Result Date: 05/28/2023 CLINICAL DATA:  Abdominal/flank pain, stone suspected. EXAM: CT ABDOMEN AND PELVIS WITHOUT CONTRAST TECHNIQUE: Multidetector CT imaging of the abdomen and pelvis was performed following the standard protocol without IV contrast. RADIATION DOSE REDUCTION: This exam was performed according to the departmental dose-optimization program which includes automated exposure control, adjustment of the mA and/or kV according to patient size and/or use of iterative reconstruction technique. COMPARISON:  CT angiography abdomen and pelvis from 04/01/2023. FINDINGS: Lower chest: The lung bases are clear. No pleural effusion. The heart is normal in size. No pericardial effusion. Hepatobiliary: The liver is normal in size. There is liver surface irregularity/nodularity, concerning for cirrhosis. No suspicious mass. No intrahepatic or extrahepatic bile duct dilation. No calcified gallstones. Normal gallbladder wall thickness. No pericholecystic inflammatory changes. Pancreas: Unremarkable. No pancreatic ductal dilatation or surrounding inflammatory changes. Spleen: Within normal limits. No focal lesion. Adrenals/Urinary Tract: Adrenal glands are unremarkable. No suspicious renal mass. No hydronephrosis. Redemonstration of a nonobstructing 6 x 2 mm calculus in the right kidney interpolar region and a nonobstructing 4 mm calculus in the right kidney lower pole calyx. No other renal or ureteric calculi. Unremarkable urinary bladder. Stomach/Bowel: No disproportionate dilation of the small or large bowel loops. No evidence of abnormal  bowel wall thickening or inflammatory changes. The appendix is unremarkable. Vascular/Lymphatic: No ascites or pneumoperitoneum. No abdominal or pelvic lymphadenopathy, by size criteria. No aneurysmal dilation of the major abdominal arteries. Reproductive: The uterus is unremarkable. No large adnexal mass. Other: There is a tiny fat containing umbilical hernia. The soft tissues and abdominal wall are otherwise unremarkable. Musculoskeletal: No suspicious osseous lesions. IMPRESSION: 1. There are two, 6 mm or smaller sized nonobstructing calculi in the right kidney, unchanged since the prior study from 04/01/2023. No other nephroureterolithiasis on either side. No obstructive uropathy. 2. No acute inflammatory process identified within the abdomen or pelvis. 3. Cirrhotic liver configuration. No CT evidence of portal venous hypertension. 4. Multiple other nonacute observations, as described above. Electronically Signed   By: Jules Schick M.D.   On: 05/28/2023 11:12    Procedures Procedures    Medications Ordered in ED Medications  sodium chloride 0.9 % bolus 500 mL (500 mLs Intravenous New Bag/Given 05/28/23 0902)  ondansetron (ZOFRAN) injection 4 mg (4 mg Intravenous Given 05/28/23 0900)  morphine (PF) 4 MG/ML injection  4 mg (4 mg Intravenous Given 05/28/23 0901)    ED Course/ Medical Decision Making/ A&P                                 Medical Decision Making Amount and/or Complexity of Data Reviewed Labs: ordered. Radiology: ordered.  Risk Prescription drug management.   Medical Decision Making / ED Course   This patient presents to the ED for concern of flank pain, this involves an extensive number of treatment options, and is a complaint that carries with it a high risk of complications and morbidity.  The differential diagnosis includes UTI, pyelonephritis, nephrolithiasis colitis, MSK pain  MDM: 42 year old female presents today for evaluation of flank pain.  CBC shows WBC of  3.4, platelets of 131 otherwise without acute concern.  CMP with sodium of 134 otherwise unremarkable.  UA without evidence of UTI.  Pregnancy test negative.  EKG without acute ischemic change.  CT renal stone study shows 2 stones within the kidney otherwise no evidence of ureteral stones.  She was given pain medication and fluids with improvement.  She had bradycardia with rates of upper 40s to mid 50s.  Previous chart review reveals rates of 50s.  She is asymptomatic.  Has appropriate response to exercise.  She is stable for discharge.  Discharged in stable condition.  Return precaution discussed.  Patient voices understanding and is in agreement with plan.   Lab Tests: -I ordered, reviewed, and interpreted labs.   The pertinent results include:   Labs Reviewed  URINALYSIS, ROUTINE W REFLEX MICROSCOPIC - Abnormal; Notable for the following components:      Result Value   Color, Urine STRAW (*)    All other components within normal limits  CBC WITH DIFFERENTIAL/PLATELET - Abnormal; Notable for the following components:   WBC 3.4 (*)    Platelets 131 (*)    All other components within normal limits  COMPREHENSIVE METABOLIC PANEL - Abnormal; Notable for the following components:   Sodium 134 (*)    All other components within normal limits  HCG, SERUM, QUALITATIVE      EKG  EKG Interpretation Date/Time:    Ventricular Rate:    PR Interval:    QRS Duration:    QT Interval:    QTC Calculation:   R Axis:      Text Interpretation:           Imaging Studies ordered: I ordered imaging studies including ct renal stone study I independently visualized and interpreted imaging. I agree with the radiologist interpretation   Medicines ordered and prescription drug management: Meds ordered this encounter  Medications   sodium chloride 0.9 % bolus 500 mL   ondansetron (ZOFRAN) injection 4 mg   morphine (PF) 4 MG/ML injection 4 mg   dicyclomine (BENTYL) 20 MG tablet    Sig:  Take 1 tablet (20 mg total) by mouth 2 (two) times daily.    Dispense:  20 tablet    Refill:  0    Order Specific Question:   Supervising Provider    Answer:   MILLER, BRIAN [[id number]]   ondansetron (ZOFRAN-ODT) 4 MG disintegrating tablet    Sig: Take 1 tablet (4 mg total) by mouth every 8 (eight) hours as needed.    Dispense:  20 tablet    Refill:  0    Order Specific Question:   Supervising Provider    Answer:   Eber Hong [  [id number]]    -I have reviewed the patients home medicines and have made adjustments as needed   Reevaluation: After the interventions noted above, I reevaluated the patient and found that they have :improved  Co morbidities that complicate the patient evaluation  Past Medical History:  Diagnosis Date   Abnormal Pap smear    ascus with High Risk for HPV   Carpal tunnel syndrome    Cirrhosis (HCC)    De Quervain's tenosynovitis, left    Difficulty reading    Gestational diabetes    GDM with last pregnancy (2008)   History of gestational diabetes    History of hepatitis B 07/08/2005   History of malaria 07/08/2001   History of thrombocytopenia    Kidney stones    Obese    Plantar fasciitis       Dispostion: Discharged in stable condition.  No indication for admission.  Final Clinical Impression(s) / ED Diagnoses Final diagnoses:  Flank pain    Rx / DC Orders ED Discharge Orders          Ordered    dicyclomine (BENTYL) 20 MG tablet  2 times daily        05/28/23 1151    ondansetron (ZOFRAN-ODT) 4 MG disintegrating tablet  Every 8 hours PRN        05/28/23 1151              Marita Kansas, PA-C 05/28/23 1155    Benjiman Core, MD 05/28/23 1519

## 2023-05-28 NOTE — ED Triage Notes (Addendum)
Pt c/o dysuria and bilateral flank pain starting yesterday.  Pain score 6/10.  During triage, Pt continually repeated that she "feels warm inside."  No fever noted.  Pt has not taken her temperature at home.

## 2023-06-03 ENCOUNTER — Ambulatory Visit (HOSPITAL_COMMUNITY)
Admission: EM | Admit: 2023-06-03 | Discharge: 2023-06-03 | Disposition: A | Discharge status: Home / Self Care | Payer: Medicaid Other

## 2023-06-03 ENCOUNTER — Encounter (HOSPITAL_COMMUNITY): Payer: Self-pay | Admitting: *Deleted

## 2023-06-03 DIAGNOSIS — M542 Cervicalgia: Secondary | ICD-10-CM

## 2023-06-03 MED ORDER — CYCLOBENZAPRINE HCL 10 MG PO TABS: 10.0000 mg | ORAL_TABLET | Freq: Two times a day (BID) | ORAL | 0 refills | Status: DC | PRN

## 2023-06-03 MED ORDER — KETOROLAC TROMETHAMINE 30 MG/ML IJ SOLN
INTRAMUSCULAR | Status: AC
  Filled 2023-06-03: qty 1

## 2023-06-03 MED ORDER — IBUPROFEN 400 MG PO TABS: 400.0000 mg | ORAL_TABLET | Freq: Three times a day (TID) | ORAL | 0 refills | Status: DC | PRN

## 2023-06-03 MED ORDER — KETOROLAC TROMETHAMINE 30 MG/ML IJ SOLN
30.0000 mg | Freq: Once | INTRAMUSCULAR | Status: AC
  Administered 2023-06-03: 30 mg via INTRAMUSCULAR

## 2023-06-03 NOTE — Discharge Instructions (Addendum)
The Toradol injection given today should start to work in about 30 minutes.   You can take the muscle relaxer (Flexeril) twice daily. If the medication makes you drowsy, take only at bed time. This should help with muscle spasm and tightness in the neck.  If pain persists tomorrow, you can take one dose of the ibuprofen 400 mg. If needed, use every 8 hours just for the next 2-3 days. If pain has improved, you do not need to take the ibuprofen at all.

## 2023-06-03 NOTE — ED Provider Notes (Signed)
[facility]    CSN: [id number] Arrival date & time: 06/03/23  0806     History   Chief Complaint Chief Complaint  Patient presents with   Headache   Neck Pain    HPI Brenda Cannon is a 42 y.o. female.  Reports left side neck discomfort, rating 6/10 Began yesterday. Denies injury or trauma Tried tylenol yesterday, minimal improvement. No meds yet today No fevers or other viral symptoms Denies headache, vision changes, dizziness.  No recent travel or sick contacts   Past Medical History:  Diagnosis Date   Abnormal Pap smear    ascus with High Risk for HPV   Carpal tunnel syndrome    Cirrhosis (HCC)    De Quervain's tenosynovitis, left    Difficulty reading    Gestational diabetes    GDM with last pregnancy (2008)   History of gestational diabetes    History of hepatitis B 07/08/2005   History of malaria 07/08/2001   History of thrombocytopenia    Kidney stones    Obese    Plantar fasciitis     Patient Active Problem List   Diagnosis Date Noted   Papanicolaou smear of cervix with positive high risk human papilloma virus (HPV) test 11/08/2020   Carpal tunnel syndrome, bilateral 05/31/2020   Type 2 diabetes mellitus with obesity (HCC) 03/24/2015   Metatarsal stress fracture of left foot 11/10/2013   Allergic rhinitis 05/26/2012   Carpal tunnel syndrome of left wrist 04/15/2011   History of malaria 03/04/2011   Chronic hepatitis B (HCC) 03/01/2011   H/O: C-section 03/01/2011   History of thrombocytopenia 03/01/2011    Past Surgical History:  Procedure Laterality Date   CESAREAN SECTION  2008   last pregnacy   CESAREAN SECTION  08/21/2011   Procedure: CESAREAN SECTION;  Surgeon: Reva Bores, MD;  Location: [facility];  Service: Gynecology;  Laterality: N/A;    OB History     Gravida  5   Para  3   Term  3   Preterm  0   AB  2   Living  3      SAB  1   IAB      Ectopic  0   Multiple  0   Live Births  3             Home Medications    Prior to Admission medications   Medication Sig Start Date End Date Taking? Authorizing Provider  cyclobenzaprine (FLEXERIL) 10 MG tablet Take 1 tablet (10 mg total) by mouth 2 (two) times daily as needed for muscle spasms. 06/03/23  Yes Jaiyanna Safran, Lurena Joiner, PA-C  ibuprofen (ADVIL) 400 MG tablet Take 1 tablet (400 mg total) by mouth every 8 (eight) hours as needed. 06/03/23  Yes Braxson Hollingsworth, Lurena Joiner, PA-C  omeprazole (PRILOSEC) 20 MG capsule Take 1 capsule (20 mg total) by mouth 2 (two) times daily before a meal. 01/17/22 06/03/23 Yes Ellsworth Lennox, PA-C  tenofovir (VIREAD) 300 MG tablet Take 300 mg by mouth daily.   Yes [provider]  dicyclomine (BENTYL) 20 MG tablet Take 1 tablet (20 mg total) by mouth 2 (two) times daily. 05/28/23   Marita Kansas, PA-C  ergocalciferol (VITAMIN D2) 1.25 MG (50000 UT) capsule Take 1 capsule by mouth once a week. SUNDAY    [provider]  meclizine (ANTIVERT) 12.5 MG tablet Take 1 tablet (12.5 mg total) by mouth 3 (three) times daily as needed for dizziness. 01/21/23   Reita May  M, FNP  metFORMIN (GLUCOPHAGE) 500 MG tablet Take 500 mg by mouth daily.    [provider]  Multiple Vitamins-Minerals (ALIVE WOMENS GUMMY PO) Take by mouth.    [provider]  ondansetron (ZOFRAN-ODT) 4 MG disintegrating tablet Take 1 tablet (4 mg total) by mouth every 8 (eight) hours as needed. 05/28/23   Marita Kansas, PA-C    Family History Family History  Problem Relation Age of Onset   Hypertension Mother    Stroke Mother    Colon cancer Neg Hx    Esophageal cancer Neg Hx    Liver cancer Neg Hx     Social History Social History   Tobacco Use   Smoking status: Never   Smokeless tobacco: Never  Vaping Use   Vaping status: Never Used  Substance Use Topics   Alcohol use: No   Drug use: No     Allergies   Cephalexin and Chloroquine   Review of Systems Review of Systems Per HPI  Physical Exam Triage  Vital Signs ED Triage Vitals  Encounter Vitals Group     BP      Systolic BP Percentile      Diastolic BP Percentile      Pulse      Resp      Temp      Temp src      SpO2      Weight      Height      Head Circumference      Peak Flow      Pain Score      Pain Loc      Pain Education      Exclude from Growth Chart    No data found.  Updated Vital Signs BP 133/85 (BP Location: Right Arm)   Pulse (!) 53   Temp 98.1 F (36.7 C) (Oral)   Resp 18   LMP 06/03/2023 (Exact Date)   SpO2 98%   Physical Exam Vitals and nursing note reviewed.  Constitutional:      General: She is not in acute distress. HENT:     Head: Atraumatic.     Right Ear: Tympanic membrane and ear canal normal.     Left Ear: Tympanic membrane and ear canal normal.     Mouth/Throat:     Mouth: Mucous membranes are moist.     Pharynx: Oropharynx is clear.  Eyes:     Extraocular Movements: Extraocular movements intact.     Conjunctiva/sclera: Conjunctivae normal.     Pupils: Pupils are equal, round, and reactive to light.  Neck:      Comments: Muscular tenderness and tightness left lateral neck. There is no bony tenderness of spine, C-L. Full ROM of neck. Upper extremities without pain. Full ROM shoulders Cardiovascular:     Rate and Rhythm: Normal rate and regular rhythm.     Pulses: Normal pulses.          Radial pulses are 2+ on the right side and 2+ on the left side.     Heart sounds: Normal heart sounds.  Pulmonary:     Effort: Pulmonary effort is normal.     Breath sounds: Normal breath sounds.  Abdominal:     Palpations: Abdomen is soft.     Tenderness: There is no abdominal tenderness.  Musculoskeletal:        General: Normal range of motion.     Cervical back: Normal range of motion. No rigidity or torticollis. Muscular tenderness present.  No spinous process tenderness. Normal range of motion.  Skin:    General: Skin is warm and dry.     Findings: No rash.  Neurological:     Mental  Status: She is alert and oriented to person, place, and time.     Cranial Nerves: No cranial nerve deficit.     Sensory: No sensory deficit.     Motor: No weakness.     Coordination: Coordination normal.     Gait: Gait normal.     Comments: Strength 5/5 throughout. Sensation intact     UC Treatments / Results  Labs (all labs ordered are listed, but only abnormal results are displayed) Labs Reviewed - No data to display  EKG  Radiology No results found.  Procedures Procedures   Medications Ordered in UC Medications  ketorolac (TORADOL) 30 MG/ML injection 30 mg (30 mg Intramuscular Given 06/03/23 0902)    Initial Impression / Assessment and Plan / UC Course  I have reviewed the triage vital signs and the nursing notes.  Pertinent labs & imaging results that were available during my care of the patient were reviewed by me and considered in my medical decision making (see chart for details).  Stable vitals. Neurologically intact. No red flags today.  Toradol IM given in clinic. Discussed benefits and risks of medication, with patient history of cirrhosis and hepatitis B. She is currently on tenofovir. Recommend muscle relaxer BID, with drowsy precautions. If tomorrow neck pain is still not better she can try a 400 mg dose ibuprofen, used every 8 hours only if needed. Advised on appropriate tylenol dosing as well. Discussed return and ED precautions. Patient is agreeable to plan, all questions answered  Final Clinical Impressions(s) / UC Diagnoses   Final diagnoses:  Neck pain     Discharge Instructions      The Toradol injection given today should start to work in about 30 minutes.   You can take the muscle relaxer (Flexeril) twice daily. If the medication makes you drowsy, take only at bed time. This should help with muscle spasm and tightness in the neck.  If pain persists tomorrow, you can take one dose of the ibuprofen 400 mg. If needed, use every 8 hours just for  the next 2-3 days. If pain has improved, you do not need to take the ibuprofen at all.     ED Prescriptions     Medication Sig Dispense Auth. Provider   cyclobenzaprine (FLEXERIL) 10 MG tablet Take 1 tablet (10 mg total) by mouth 2 (two) times daily as needed for muscle spasms. 20 tablet Nusaybah Ivie, PA-C   ibuprofen (ADVIL) 400 MG tablet Take 1 tablet (400 mg total) by mouth every 8 (eight) hours as needed. 20 tablet Hajime Asfaw, Lurena Joiner, PA-C      PDMP not reviewed this encounter.   Marlow Baars, [place] 06/03/23 1610

## 2023-06-03 NOTE — ED Triage Notes (Signed)
Pt states she has headache and neck pain that started yesterday. She has taken tylenol without relief.   She was seen in ED last week but hasn't picked up the meds that were given then.

## 2023-06-17 ENCOUNTER — Encounter (HOSPITAL_COMMUNITY): Payer: Self-pay

## 2023-06-17 ENCOUNTER — Ambulatory Visit (HOSPITAL_COMMUNITY)
Admission: EM | Admit: 2023-06-17 | Discharge: 2023-06-17 | Disposition: A | Discharge status: Home / Self Care | Payer: Medicaid Other | Attending: Family Medicine | Admitting: Family Medicine

## 2023-06-17 DIAGNOSIS — R002 Palpitations: Secondary | ICD-10-CM | POA: Diagnosis not present

## 2023-06-17 DIAGNOSIS — R072 Precordial pain: Secondary | ICD-10-CM | POA: Diagnosis not present

## 2023-06-17 LAB — BASIC METABOLIC PANEL
Anion gap: 8 (ref 5–15)
BUN: 14 mg/dL (ref 6–20)
CO2: 24 mmol/L (ref 22–32)
Calcium: 9 mg/dL (ref 8.9–10.3)
Chloride: 106 mmol/L (ref 98–111)
Creatinine, Ser: 0.74 mg/dL (ref 0.44–1.00)
GFR, Estimated: >60 mL/min (ref >60)
Glucose, Bld: 144 mg/dL — ABNORMAL HIGH (ref 70–99)
Potassium: 3.2 mmol/L — ABNORMAL LOW (ref 3.5–5.1)
Sodium: 138 mmol/L (ref 135–145)

## 2023-06-17 LAB — CBC
HCT: 38.4 % (ref 36.0–46.0)
Hemoglobin: 12.6 g/dL (ref 12.0–15.0)
MCH: 29.1 pg (ref 26.0–34.0)
MCHC: 32.8 g/dL (ref 30.0–36.0)
MCV: 88.7 fL (ref 80.0–100.0)
Platelets: 119 10*3/uL — ABNORMAL LOW (ref 150–400)
RBC: 4.33 MIL/uL (ref 3.87–5.11)
RDW: 12.6 % (ref 11.5–15.5)
WBC: 2.9 10*3/uL — ABNORMAL LOW (ref 4.0–10.5)
nRBC: 0 % (ref 0.0–0.2)

## 2023-06-17 NOTE — ED Notes (Signed)
[place] MD made aware of pt and reported symptoms.

## 2023-06-17 NOTE — ED Provider Notes (Signed)
[facility]    CSN: [id number] Arrival date & time: 06/17/23  1440      History   Chief Complaint Chief Complaint  Patient presents with   Chest Pain    HPI Brenda Cannon is a 42 y.o. female.    Chest Pain Here for sensation that her heart is beating fast and pounding; the pain is not that pronounced, but the palpitation feeling is much more intense for her  .  She states she has been having the symptoms off and on for about a week.  They she felt the symptoms start about noon when she was working.  No dizziness or lightheadedness. Her blood pressure here is 123/93 and heart rate at triage to 72  She does have an appointment with cardiology for later this month.  She has been seen here in the emergency room several times for similar symptoms.     Past Medical History:  Diagnosis Date   Abnormal Pap smear    ascus with High Risk for HPV   Carpal tunnel syndrome    Cirrhosis (HCC)    De Quervain's tenosynovitis, left    Difficulty reading    Gestational diabetes    GDM with last pregnancy (2008)   History of gestational diabetes    History of hepatitis B 07/08/2005   History of malaria 07/08/2001   History of thrombocytopenia    Kidney stones    Obese    Plantar fasciitis     Patient Active Problem List   Diagnosis Date Noted   Papanicolaou smear of cervix with positive high risk human papilloma virus (HPV) test 11/08/2020   Carpal tunnel syndrome, bilateral 05/31/2020   Type 2 diabetes mellitus with obesity (HCC) 03/24/2015   Metatarsal stress fracture of left foot 11/10/2013   Allergic rhinitis 05/26/2012   Carpal tunnel syndrome of left wrist 04/15/2011   History of malaria 03/04/2011   Chronic hepatitis B (HCC) 03/01/2011   H/O: C-section 03/01/2011   History of thrombocytopenia 03/01/2011    Past Surgical History:  Procedure Laterality Date   CESAREAN SECTION  2008   last pregnacy   CESAREAN SECTION  08/21/2011   Procedure:  CESAREAN SECTION;  Surgeon: Reva Bores, MD;  Location: [facility];  Service: Gynecology;  Laterality: N/A;    OB History     Gravida  5   Para  3   Term  3   Preterm  0   AB  2   Living  3      SAB  1   IAB      Ectopic  0   Multiple  0   Live Births  3            Home Medications    Prior to Admission medications   Medication Sig Start Date End Date Taking? Authorizing Provider  cyclobenzaprine (FLEXERIL) 10 MG tablet Take 1 tablet (10 mg total) by mouth 2 (two) times daily as needed for muscle spasms. 06/03/23   Rising, Lurena Joiner, PA-C  dicyclomine (BENTYL) 20 MG tablet Take 1 tablet (20 mg total) by mouth 2 (two) times daily. 05/28/23   Marita Kansas, PA-C  ergocalciferol (VITAMIN D2) 1.25 MG (50000 UT) capsule Take 1 capsule by mouth once a week. SUNDAY    [provider]  ibuprofen (ADVIL) 400 MG tablet Take 1 tablet (400 mg total) by mouth every 8 (eight) hours as needed. 06/03/23   Rising, Lurena Joiner, PA-C  meclizine (ANTIVERT) 12.5 MG  tablet Take 1 tablet (12.5 mg total) by mouth 3 (three) times daily as needed for dizziness. 01/21/23   Carlisle Beers, FNP  metFORMIN (GLUCOPHAGE) 500 MG tablet Take 500 mg by mouth daily.    [provider]  Multiple Vitamins-Minerals (ALIVE WOMENS GUMMY PO) Take by mouth.    [provider]  omeprazole (PRILOSEC) 20 MG capsule Take 1 capsule (20 mg total) by mouth 2 (two) times daily before a meal. 01/17/22 06/03/23  Ellsworth Lennox, PA-C  ondansetron (ZOFRAN-ODT) 4 MG disintegrating tablet Take 1 tablet (4 mg total) by mouth every 8 (eight) hours as needed. 05/28/23   Marita Kansas, PA-C  tenofovir (VIREAD) 300 MG tablet Take 300 mg by mouth daily.    [provider]    Family History Family History  Problem Relation Age of Onset   Hypertension Mother    Stroke Mother    Colon cancer Neg Hx    Esophageal cancer Neg Hx    Liver cancer Neg Hx     Social History Social History   Tobacco  Use   Smoking status: Never   Smokeless tobacco: Never  Vaping Use   Vaping status: Never Used  Substance Use Topics   Alcohol use: No   Drug use: No     Allergies   Cephalexin and Chloroquine   Review of Systems Review of Systems  Cardiovascular:  Positive for chest pain.     Physical Exam Triage Vital Signs ED Triage Vitals  Encounter Vitals Group     BP 06/17/23 1450 (!) 123/93     Systolic BP Percentile --      Diastolic BP Percentile --      Pulse Rate 06/17/23 1450 72     Resp 06/17/23 1450 18     Temp 06/17/23 1450 98.3 F (36.8 C)     Temp Source 06/17/23 1450 Oral     SpO2 06/17/23 1450 100 %     Weight --      Height --      Head Circumference --      Peak Flow --      Pain Score 06/17/23 1453 4     Pain Loc --      Pain Education --      Exclude from Growth Chart --    No data found.  Updated Vital Signs BP (!) 123/93 (BP Location: Left Arm)   Pulse 72   Temp 98.3 F (36.8 C) (Oral)   Resp 18   LMP 06/03/2023 (Exact Date)   SpO2 100%   Visual Acuity Right Eye Distance:   Left Eye Distance:   Bilateral Distance:    Right Eye Near:   Left Eye Near:    Bilateral Near:     Physical Exam Vitals reviewed.  Constitutional:      General: She is not in acute distress.    Appearance: She is not ill-appearing, toxic-appearing or diaphoretic.  HENT:     Mouth/Throat:     Mouth: Mucous membranes are moist.  Eyes:     Extraocular Movements: Extraocular movements intact.     Conjunctiva/sclera: Conjunctivae normal.     Pupils: Pupils are equal, round, and reactive to light.  Cardiovascular:     Rate and Rhythm: Normal rate and regular rhythm.     Heart sounds: No murmur heard. Pulmonary:     Breath sounds: Normal breath sounds.  Musculoskeletal:     Cervical back: Neck supple.  Lymphadenopathy:  Cervical: No cervical adenopathy.  Skin:    Coloration: Skin is not pale.  Neurological:     Mental Status: She is alert and oriented to  person, place, and time.  Psychiatric:        Behavior: Behavior normal.      UC Treatments / Results  Labs (all labs ordered are listed, but only abnormal results are displayed) Labs Reviewed  CBC  BASIC METABOLIC PANEL    EKG   Radiology No results found.  Procedures Procedures (including critical care time)  Medications Ordered in UC Medications - No data to display  Initial Impression / Assessment and Plan / UC Course  I have reviewed the triage vital signs and the nursing notes.  Pertinent labs & imaging results that were available during my care of the patient were reviewed by me and considered in my medical decision making (see chart for details).     EKG again shows normal sinus rhythm with a heart rate of 61.  There are no ST segment changes that are concerning.  BMP and CBC are drawn today to evaluate further.  She will keep her cardiology appointment Final Clinical Impressions(s) / UC Diagnoses   Final diagnoses:  Palpitations  Precordial pain     Discharge Instructions      Your EKG was normal with a normal heart rate of 61 on the tracing.  We have drawn blood to check your blood counts and sodium and potassium.  Staff notified if anything is significantly abnormal  Please keep your appointment with cardiology  I think it would help you to follow-up with your primary care about the symptoms.      ED Prescriptions   None    PDMP not reviewed this encounter.   Zenia Resides, MD 06/17/23 [phone]

## 2023-06-17 NOTE — Discharge Instructions (Signed)
Your EKG was normal with a normal heart rate of 61 on the tracing.  We have drawn blood to check your blood counts and sodium and potassium.  Staff notified if anything is significantly abnormal  Please keep your appointment with cardiology  I think it would help you to follow-up with your primary care about the symptoms.

## 2023-06-17 NOTE — ED Triage Notes (Addendum)
Pt presents to urgent care today with complaints of rapid heart rate and generalized pain in chest (rates 4/10). Pt states symptoms have been ongoing for about two weeks. Today at work, pt states she became dizzy and was concerned. Pt states she has been to this urgent care, in addition to the emergency room about these symptoms reported. Pt denies taking medications for her pain/symptoms. Pt does have an appointment with cardiology on Dec. 24th.

## 2023-06-18 ENCOUNTER — Telehealth: Payer: Self-pay | Admitting: Emergency Medicine

## 2023-06-18 MED ORDER — POTASSIUM CHLORIDE CRYS ER 20 MEQ PO TBCR: 20.0000 meq | EXTENDED_RELEASE_TABLET | Freq: Every day | ORAL | 0 refills | Status: DC

## 2023-06-18 NOTE — Telephone Encounter (Signed)
 Attempted to reach patient Brenda Cannon. LVM.

## 2023-06-18 NOTE — Telephone Encounter (Signed)
Patient seen at urgent care on June 17, 2023, was found to have a K of 3.2.  Patient was provided with a prescription for Klor-Con 20 mill equivalents daily x 2 doses.  Clinical staff notified, clinical staff to notify patient of abnormal finding and recommended treatment.

## 2023-06-30 NOTE — Progress Notes (Signed)
Cardiology Office Note:  .   Date:  07/01/2023  ID:  Brenda Cannon, DOB Dec 24, 1980, MRN [id number] PCP: Fleet Contras, MD  [facility] Providers Cardiologist:  Jodelle Red, MD {  History of Present Illness: .   Brenda Cannon is a 42 y.o. female with chronic hepatitis B, liver cirrhosis, type II diabetes, obesity. She follows with transplant hepatology at [facility]. She is seen as a new patient on 07/01/23 for palpitations.  Today: Seen in urgent care 06/17/23 for palpitations. BP and HR normal in urgent care.  ECG NSR. BMET, CBC normal. Recommended to follow up with cardiology.  Per [facility] from [facility] 04/01/23, also noted chest pain x 2 weeks, new murmur noted at exam, sent to ER for further evaluation.  Tachycardia/palpitations: -Initial onset: about a month -Frequency/Duration: occurs any time of day. Happens throughout the day -Associated symptoms: when the beats stop, she feels a brief pain in her chest. Feels short of breath while it is happening. Her hair/neck hurts after an episode. -Aggravating/alleviating factors: triggered when she eats. Takes tylenol after for the pain, which helps -Syncope/near syncope: none -Prior cardiac history: none -Prior ECG: NSR with nonspecific t wave pattern -Prior workup: ER/urgent care visits -Prior treatment: none -Possible medication interactions: no clear triggers -Caffeine: none -Alcohol: none -Tobacco: none -Exercise level: -Family history:  no history of any heart disease she is aware of  ROS: Denies chest pain, shortness of breath at rest or with normal exertion. No PND, orthopnea, LE edema or unexpected weight gain. No syncope. ROS otherwise negative except as noted.   Studies Reviewed: Marland Kitchen    EKG:       Physical Exam:   VS:  BP 130/89 (BP Location: Right Arm, Patient Position: Sitting, Cuff Size: Normal)   Pulse 62   Resp 18   Ht 5\' 1"  (1.549 m)   Wt 191 lb (86.6 kg)   LMP 06/03/2023 (Exact Date)   SpO2 98%    BMI 36.09 kg/m    Wt Readings from Last 3 Encounters:  07/01/23 191 lb (86.6 kg)  05/28/23 194 lb (88 kg)  04/01/23 194 lb (88 kg)    GEN: Well nourished, well developed in no acute distress HEENT: Normal, moist mucous membranes NECK: No JVD CARDIAC: regular rhythm, normal S1 and S2, no rubs or gallops. 1-2/6 systolic murmur. VASCULAR: Radial and DP pulses 2+ bilaterally. No carotid bruits RESPIRATORY:  Clear to auscultation without rales, wheezing or rhonchi  ABDOMEN: Soft, non-tender, non-distended MUSCULOSKELETAL:  Ambulates independently SKIN: Warm and dry, no edema NEUROLOGIC:  Alert and oriented x 3. No focal neuro deficits noted. PSYCHIATRIC:  Normal affect    ASSESSMENT AND PLAN: .    Palpitations Atypical chest pain related to palpitations -we discussed the potential causes of fast heart rates and palpitations today. Reviewed the normal electrical system of the heart. Reviewed the role of the sinus node. Reviewed the balance between resting (vagal) tone and fight or flight nervous system input. Reviewed how exercise improves vagal tone and lowers resting heart rate. Reviewed that sinus tachycardia, or elevated sinus rate, is usually secondary to something else in the body. This can include pain, stress, infection, anxiety, hormone imbalance, low blood counts, etc. Discussed that we do not typically treat sinus tachycardia by itself, and instead the focus is on finding what is driving the heart rate and treating that. We discussed that there can be other rhythm issues, from either the top or bottom of the heart, that are  abnormal rhythms. Discussed how we evaluate for these.  -will get 2 week Zio for further evaluation  Murmur -soft, systolic, no high risk features -will get echo if monitor is abnormal  CV risk counseling and prevention -recommend heart healthy/Mediterranean diet, with whole grains, fruits, vegetable, fish, lean meats, nuts, and olive oil. Limit  salt. -recommend moderate walking, 3-5 times/week for 30-50 minutes each session. Aim for at least 150 minutes.week. Goal should be pace of 3 miles/hours, or walking 1.5 miles in 30 minutes -recommend avoidance of tobacco products. Avoid excess alcohol. -ASCVD risk score: The 10-year ASCVD risk score (Arnett DK, et al., 2019) is: 0.9%   Values used to calculate the score:     Age: 12 years     Sex: Female     Is Non-Hispanic African American: Yes     Diabetic: Yes     Tobacco smoker: No     Systolic Blood Pressure: 130 mmHg     Is BP treated: No     HDL Cholesterol: 65 mg/dL     Total Cholesterol: 154 mg/dL    Dispo: tbd based on results of testing  Signed, Jodelle Red, MD   Jodelle Red, MD, PhD, [facility]  [facility]  [place]  [facility] at [facility] at [facility] [address], [address] [place], [place] [address] [phone]

## 2023-07-01 ENCOUNTER — Encounter (HOSPITAL_BASED_OUTPATIENT_CLINIC_OR_DEPARTMENT_OTHER): Payer: Self-pay | Admitting: Cardiology

## 2023-07-01 ENCOUNTER — Ambulatory Visit (HOSPITAL_BASED_OUTPATIENT_CLINIC_OR_DEPARTMENT_OTHER): Payer: Medicaid Other | Admitting: Cardiology

## 2023-07-01 ENCOUNTER — Ambulatory Visit: Payer: Medicaid Other | Attending: Cardiology

## 2023-07-01 VITALS — BP 130/89 | HR 62 | Resp 18 | Ht 61.0 in | Wt 191.0 lb

## 2023-07-01 DIAGNOSIS — Z7189 Other specified counseling: Secondary | ICD-10-CM

## 2023-07-01 DIAGNOSIS — R079 Chest pain, unspecified: Secondary | ICD-10-CM

## 2023-07-01 DIAGNOSIS — R002 Palpitations: Secondary | ICD-10-CM

## 2023-07-01 DIAGNOSIS — R011 Cardiac murmur, unspecified: Secondary | ICD-10-CM | POA: Diagnosis not present

## 2023-07-01 NOTE — Progress Notes (Unsigned)
Serial # [id number] applied at [facility].   (Schedule blocked, unable to put on their schedule)

## 2023-07-01 NOTE — Patient Instructions (Signed)
Medication Instructions:  Your physician recommends that you continue on your current medications as directed. Please refer to the Current Medication list given to you today.   Labwork: NONE  Testing/Procedures: 2 WEEK ZIO   Follow-Up: TO BE DETERMINED BASED ON RESULTS   Any Other Special Instructions Will Be Listed Below (If Applicable).  ZIO XT- Long Term Monitor Instructions  Your physician has requested you wear a ZIO patch monitor for 14 days.  This is a single patch monitor. [employer] supplies one patch monitor per enrollment. Additional stickers are not available. Please do not apply patch if you will be having a Nuclear Stress Test,  Echocardiogram, Cardiac CT, MRI, or Chest Xray during the period you would be wearing the  monitor. The patch cannot be worn during these tests. You cannot remove and re-apply the  ZIO XT patch monitor.  Your ZIO patch monitor will be mailed 3 day USPS to your address on file. It may take 3-5 days  to receive your monitor after you have been enrolled.  Once you have received your monitor, please review the enclosed instructions. Your monitor  has already been registered assigning a specific monitor serial # to you.  Billing and Patient Assistance Program Information  We have supplied [employer] with any of your insurance information on file for billing purposes. [employer] offers a sliding scale Patient Assistance Program for patients that do not have  insurance, or whose insurance does not completely cover the cost of the ZIO monitor.  You must apply for the Patient Assistance Program to qualify for this discounted rate.  To apply, please call [employer] at [phone], select option 4, select option 2, ask to apply for  Patient Assistance Program. Meredeth Ide will ask your household income, and how many people  are in your household. They will quote your out-of-pocket cost based on that information.  [employer] will also be able to set up a 72-month,  interest-free payment plan if needed.  Applying the monitor   Shave hair from upper left chest.  Hold abrader disc by orange tab. Rub abrader in 40 strokes over the upper left chest as  indicated in your monitor instructions.  Clean area with 4 enclosed alcohol pads. Let dry.  Apply patch as indicated in monitor instructions. Patch will be placed under collarbone on left  side of chest with arrow pointing upward.  Rub patch adhesive wings for 2 minutes. Remove white label marked "1". Remove the white  label marked "2". Rub patch adhesive wings for 2 additional minutes.  While looking in a mirror, press and release button in center of patch. A small green light will  flash 3-4 times. This will be your only indicator that the monitor has been turned on.  Do not shower for the first 24 hours. You may shower after the first 24 hours.  Press the button if you feel a symptom. You will hear a small click. Record Date, Time and  Symptom in the Patient Logbook.  When you are ready to remove the patch, follow instructions on the last 2 pages of Patient  Logbook. Stick patch monitor onto the last page of Patient Logbook.  Place Patient Logbook in the blue and white box. Use locking tab on box and tape box closed  securely. The blue and white box has prepaid postage on it. Please place it in the mailbox as  soon as possible. Your physician should have your test results approximately 7 days after the  monitor has been  mailed back to [facility].  Call [facility] Customer Care at [phone] if you have questions regarding  your ZIO XT patch monitor. Call them immediately if you see an orange light blinking on your  monitor.  If your monitor falls off in less than 4 days, contact our Monitor department at [phone].  If your monitor becomes loose or falls off after 4 days call [employer] at [phone] for  suggestions on securing your monitor

## 2023-07-29 ENCOUNTER — Ambulatory Visit (HOSPITAL_COMMUNITY)
Admission: EM | Admit: 2023-07-29 | Discharge: 2023-07-29 | Disposition: A | Discharge status: Home / Self Care | Payer: Medicaid Other | Attending: Family Medicine | Admitting: Family Medicine

## 2023-07-29 ENCOUNTER — Encounter (HOSPITAL_COMMUNITY): Payer: Self-pay | Admitting: *Deleted

## 2023-07-29 ENCOUNTER — Other Ambulatory Visit: Payer: Self-pay

## 2023-07-29 DIAGNOSIS — M545 Low back pain, unspecified: Secondary | ICD-10-CM | POA: Diagnosis not present

## 2023-07-29 DIAGNOSIS — M542 Cervicalgia: Secondary | ICD-10-CM

## 2023-07-29 DIAGNOSIS — J069 Acute upper respiratory infection, unspecified: Secondary | ICD-10-CM

## 2023-07-29 DIAGNOSIS — M79672 Pain in left foot: Secondary | ICD-10-CM

## 2023-07-29 DIAGNOSIS — H6993 Unspecified Eustachian tube disorder, bilateral: Secondary | ICD-10-CM | POA: Diagnosis not present

## 2023-07-29 MED ORDER — METHYLPREDNISOLONE 4 MG PO TBPK: ORAL_TABLET | ORAL | 0 refills | Status: DC

## 2023-07-29 MED ORDER — TIZANIDINE HCL 4 MG PO TABS: 4.0000 mg | ORAL_TABLET | Freq: Four times a day (QID) | ORAL | 0 refills | Status: DC | PRN

## 2023-07-29 NOTE — ED Triage Notes (Signed)
PT reports having a electric shock pain in her Lt foot. Pt also has ear pain Bil. And sinus pain.

## 2023-07-29 NOTE — Discharge Instructions (Signed)
You were seen today for various issues.  I have sent out a steroid dose pack to help with the sinuses, ears and headache.   This may also help with the back and foot pain.  I have sent out a low dose muscle relaxer to help with the neck and back pain. You may also use a heating pad.  This may make you tired so please take when home and not driving.  If your symptoms are not improving then please follow up with your primary care provider for further care/work up.

## 2023-07-29 NOTE — ED Provider Notes (Signed)
[facility]    CSN: [id number] Arrival date & time: 07/29/23  0801      History   Chief Complaint Chief Complaint  Patient presents with   Otalgia   Back Pain   Foot Pain    HPI Brenda Cannon is a 43 y.o. female.    Otalgia Associated symptoms: congestion and rhinorrhea   Back Pain Foot Pain  Patient I here for several issues.  Having sinus congestion, drainage, ear pressure.  She has some pain to the right back of the neck.  This has been going on for about a week.   She has been using flonse with some help.  Some low back pain on the left.  At times, for the last week she notes an electric "shock" to the left foot.  To the top and bottom of the foot.  Does not go up the leg.         Past Medical History:  Diagnosis Date   Abnormal Pap smear    ascus with High Risk for HPV   Carpal tunnel syndrome    Cirrhosis (HCC)    De Quervain's tenosynovitis, left    Difficulty reading    Gestational diabetes    GDM with last pregnancy (2008)   History of gestational diabetes    History of hepatitis B 07/08/2005   History of malaria 07/08/2001   History of thrombocytopenia    Kidney stones    Obese    Plantar fasciitis     Patient Active Problem List   Diagnosis Date Noted   Papanicolaou smear of cervix with positive high risk human papilloma virus (HPV) test 11/08/2020   Carpal tunnel syndrome, bilateral 05/31/2020   Type 2 diabetes mellitus with obesity (HCC) 03/24/2015   Metatarsal stress fracture of left foot 11/10/2013   Allergic rhinitis 05/26/2012   Carpal tunnel syndrome of left wrist 04/15/2011   History of malaria 03/04/2011   Chronic hepatitis B (HCC) 03/01/2011   H/O: C-section 03/01/2011   History of thrombocytopenia 03/01/2011    Past Surgical History:  Procedure Laterality Date   CESAREAN SECTION  2008   last pregnacy   CESAREAN SECTION  08/21/2011   Procedure: CESAREAN SECTION;  Surgeon: Reva Bores, MD;  Location: [facility];  Service: Gynecology;  Laterality: N/A;    OB History     Gravida  5   Para  3   Term  3   Preterm  0   AB  2   Living  3      SAB  1   IAB      Ectopic  0   Multiple  0   Live Births  3            Home Medications    Prior to Admission medications   Medication Sig Start Date End Date Taking? Authorizing Provider  acetaminophen (TYLENOL) 500 MG tablet Take 500 mg by mouth every 8 (eight) hours as needed.    [provider]  aspirin EC 325 MG tablet Take 325 mg by mouth every 6 (six) hours as needed.    [provider]  cetirizine (ZYRTEC) 10 MG chewable tablet Chew 10 mg by mouth daily as needed for allergies.    [provider]  ergocalciferol (VITAMIN D2) 1.25 MG (50000 UT) capsule Take 1 capsule by mouth once a week. SUNDAY Patient not taking: Reported on 07/01/2023    [provider]  meclizine (ANTIVERT) 12.5 MG  tablet Take 1 tablet (12.5 mg total) by mouth 3 (three) times daily as needed for dizziness. 01/21/23   Carlisle Beers, FNP  metFORMIN (GLUCOPHAGE) 500 MG tablet Take 500 mg by mouth every other day.    [provider]  Multiple Vitamins-Minerals (ALIVE WOMENS GUMMY PO) Take by mouth.    [provider]  omeprazole (PRILOSEC) 20 MG capsule Take 1 capsule (20 mg total) by mouth 2 (two) times daily before a meal. 01/17/22 07/01/23  Ellsworth Lennox, PA-C  ondansetron (ZOFRAN-ODT) 4 MG disintegrating tablet Take 1 tablet (4 mg total) by mouth every 8 (eight) hours as needed. 05/28/23   Karie Mainland, Amjad, PA-C  potassium chloride SA (KLOR-CON M) 20 MEQ tablet Take 1 tablet (20 mEq total) by mouth daily for 2 days. Patient not taking: Reported on 07/01/2023 06/18/23 07/01/23  Theadora Rama Scales, PA-C  simethicone (MYLICON) 125 MG chewable tablet Chew 125 mg by mouth every 6 (six) hours as needed for flatulence.    [provider]  tenofovir (VIREAD) 300 MG tablet Take 300 mg by mouth daily.     [provider]    Family History Family History  Problem Relation Age of Onset   Hypertension Mother    Stroke Mother    Colon cancer Neg Hx    Esophageal cancer Neg Hx    Liver cancer Neg Hx     Social History Social History   Tobacco Use   Smoking status: Never   Smokeless tobacco: Never  Vaping Use   Vaping status: Never Used  Substance Use Topics   Alcohol use: No   Drug use: No     Allergies   Cephalexin and Chloroquine   Review of Systems Review of Systems  Constitutional:  Positive for chills.  HENT:  Positive for congestion, ear pain and rhinorrhea.   Respiratory: Negative.    Cardiovascular: Negative.   Gastrointestinal: Negative.   Genitourinary: Negative.   Musculoskeletal:  Positive for back pain.  Neurological: Negative.   Psychiatric/Behavioral: Negative.       Physical Exam Triage Vital Signs ED Triage Vitals  Encounter Vitals Group     BP 07/29/23 0835 139/89     Systolic BP Percentile --      Diastolic BP Percentile --      Pulse Rate 07/29/23 0835 (!) 59     Resp 07/29/23 0835 18     Temp 07/29/23 0835 98.9 F (37.2 C)     Temp src --      SpO2 07/29/23 0835 96 %     Weight --      Height --      Head Circumference --      Peak Flow --      Pain Score 07/29/23 0832 8     Pain Loc --      Pain Education --      Exclude from Growth Chart --    No data found.  Updated Vital Signs BP 139/89   Pulse (!) 59   Temp 98.9 F (37.2 C)   Resp 18   LMP 07/25/2023   SpO2 96%   Visual Acuity Right Eye Distance:   Left Eye Distance:   Bilateral Distance:    Right Eye Near:   Left Eye Near:    Bilateral Near:     Physical Exam Constitutional:      General: She is not in acute distress.    Appearance: Normal appearance. She is normal weight. She is  not ill-appearing or toxic-appearing.  HENT:     Head:     Salivary Glands: Right salivary gland is not tender. Left salivary gland is not tender.     Right Ear: A  middle ear effusion is present. Tympanic membrane is not erythematous.     Left Ear: A middle ear effusion is present. Tympanic membrane is not erythematous.     Nose: Nose normal.     Mouth/Throat:     Mouth: Mucous membranes are moist.  Cardiovascular:     Rate and Rhythm: Normal rate and regular rhythm.  Pulmonary:     Effort: Pulmonary effort is normal.     Breath sounds: Normal breath sounds.  Musculoskeletal:     Cervical back: Normal range of motion and neck supple.     Comments: Mild ttp to the posterior neck;  full rom without limitation;   +TTP to the left lower back/SI joint area  Skin:    General: Skin is warm.  Neurological:     General: No focal deficit present.     Mental Status: She is alert.  Psychiatric:        Mood and Affect: Mood normal.      UC Treatments / Results  Labs (all labs ordered are listed, but only abnormal results are displayed) Labs Reviewed - No data to display  EKG   Radiology No results found.  Procedures Procedures (including critical care time)  Medications Ordered in UC Medications - No data to display  Initial Impression / Assessment and Plan / UC Course  I have reviewed the triage vital signs and the nursing notes.  Pertinent labs & imaging results that were available during my care of the patient were reviewed by me and considered in my medical decision making (see chart for details).  Final Clinical Impressions(s) / UC Diagnoses   Final diagnoses:  Acute upper respiratory infection  Disorder of both eustachian tubes  Neck pain  Acute left-sided low back pain, unspecified whether sciatica present  Left foot pain     Discharge Instructions      You were seen today for various issues.  I have sent out a steroid dose pack to help with the sinuses, ears and headache.   This may also help with the back and foot pain.  I have sent out a low dose muscle relaxer to help with the neck and back pain. You may also use a  heating pad.  This may make you tired so please take when home and not driving.  If your symptoms are not improving then please follow up with your primary care provider for further care/work up.     ED Prescriptions     Medication Sig Dispense Auth. Provider   methylPREDNISolone (MEDROL DOSEPAK) 4 MG TBPK tablet Take as directed 1 each Jonalyn Sedlak, MD   tiZANidine (ZANAFLEX) 4 MG tablet Take 1 tablet (4 mg total) by mouth every 6 (six) hours as needed for muscle spasms. 30 tablet Jannifer Franklin, MD      PDMP not reviewed this encounter.   Jannifer Franklin, MD 07/29/23 [phone]

## 2023-08-26 ENCOUNTER — Emergency Department (HOSPITAL_COMMUNITY): Payer: Medicaid Other

## 2023-08-26 ENCOUNTER — Emergency Department (HOSPITAL_COMMUNITY)
Admission: EM | Admit: 2023-08-26 | Discharge: 2023-08-26 | Disposition: A | Discharge status: Home / Self Care | Payer: Medicaid Other | Attending: Emergency Medicine | Admitting: Emergency Medicine

## 2023-08-26 DIAGNOSIS — Z7984 Long term (current) use of oral hypoglycemic drugs: Secondary | ICD-10-CM | POA: Diagnosis not present

## 2023-08-26 DIAGNOSIS — S299XXA Unspecified injury of thorax, initial encounter: Secondary | ICD-10-CM | POA: Diagnosis present

## 2023-08-26 DIAGNOSIS — S29011A Strain of muscle and tendon of front wall of thorax, initial encounter: Secondary | ICD-10-CM

## 2023-08-26 DIAGNOSIS — E119 Type 2 diabetes mellitus without complications: Secondary | ICD-10-CM | POA: Insufficient documentation

## 2023-08-26 DIAGNOSIS — X58XXXA Exposure to other specified factors, initial encounter: Secondary | ICD-10-CM | POA: Diagnosis not present

## 2023-08-26 DIAGNOSIS — R079 Chest pain, unspecified: Secondary | ICD-10-CM

## 2023-08-26 LAB — CBC
HCT: 41.1 % (ref 36.0–46.0)
Hemoglobin: 12.9 g/dL (ref 12.0–15.0)
MCH: 28.9 pg (ref 26.0–34.0)
MCHC: 31.4 g/dL (ref 30.0–36.0)
MCV: 92.2 fL (ref 80.0–100.0)
Platelets: 121 10*3/uL — ABNORMAL LOW (ref 150–400)
RBC: 4.46 MIL/uL (ref 3.87–5.11)
RDW: 13.2 % (ref 11.5–15.5)
WBC: 2.5 10*3/uL — ABNORMAL LOW (ref 4.0–10.5)
nRBC: 0 % (ref 0.0–0.2)

## 2023-08-26 LAB — BASIC METABOLIC PANEL
Anion gap: 7 (ref 5–15)
BUN: 17 mg/dL (ref 6–20)
CO2: 23 mmol/L (ref 22–32)
Calcium: 9 mg/dL (ref 8.9–10.3)
Chloride: 105 mmol/L (ref 98–111)
Creatinine, Ser: 0.7 mg/dL (ref 0.44–1.00)
GFR, Estimated: >60 mL/min (ref >60)
Glucose, Bld: 126 mg/dL — ABNORMAL HIGH (ref 70–99)
Potassium: 3.7 mmol/L (ref 3.5–5.1)
Sodium: 135 mmol/L (ref 135–145)

## 2023-08-26 LAB — TROPONIN I (HIGH SENSITIVITY): Troponin I (High Sensitivity): 3 ng/L (ref <18)

## 2023-08-26 LAB — HCG, SERUM, QUALITATIVE: Preg, Serum: NEGATIVE

## 2023-08-26 MED ORDER — METOCLOPRAMIDE HCL 5 MG/ML IJ SOLN
10.0000 mg | Freq: Once | INTRAMUSCULAR | Status: AC
  Administered 2023-08-26: 10 mg via INTRAVENOUS
  Filled 2023-08-26: qty 2

## 2023-08-26 MED ORDER — KETOROLAC TROMETHAMINE 15 MG/ML IJ SOLN
15.0000 mg | Freq: Once | INTRAMUSCULAR | Status: AC
  Administered 2023-08-26: 15 mg via INTRAVENOUS
  Filled 2023-08-26: qty 1

## 2023-08-26 MED ORDER — DIAZEPAM 2 MG PO TABS
2.0000 mg | ORAL_TABLET | Freq: Once | ORAL | Status: DC
  Filled 2023-08-26: qty 1

## 2023-08-26 MED ORDER — SODIUM CHLORIDE 0.9 % IV BOLUS
1000.0000 mL | Freq: Once | INTRAVENOUS | Status: AC
  Administered 2023-08-26: 1000 mL via INTRAVENOUS

## 2023-08-26 MED ORDER — DIPHENHYDRAMINE HCL 50 MG/ML IJ SOLN
25.0000 mg | Freq: Once | INTRAMUSCULAR | Status: AC
  Administered 2023-08-26: 25 mg via INTRAVENOUS
  Filled 2023-08-26: qty 1

## 2023-08-26 NOTE — ED Provider Notes (Signed)
 [facility] EMERGENCY DEPARTMENT AT [facility] Provider Note   CSN: [id number] Arrival date & time: 08/26/23  5784     History  Chief Complaint  Patient presents with   Chest Pain    Brenda Cannon is a 43 y.o. female, history of type 2 diabetes, malaria, who presents to the ED secondary to chest discomfort, radiating from the left breast, to the left shoulder, worse with movement.  This has been going on for the last few hours, she states she woke up this way, and when she noticed it was painful, she became short of breath.  She denies any shortness of breath currently, and pain is not pleuritic.  Denies any nausea, vomiting.  No history of this, does note that she has been recently having more frequent headaches, but denies any weakness, difficulty with speech, head trauma, or difficulty swallowing or vision changes.  Denies any oral contraceptives, recent trauma, or surgery.  No recent illnesses    Home Medications Prior to Admission medications   Medication Sig Start Date End Date Taking? Authorizing Provider  metFORMIN (GLUCOPHAGE) 500 MG tablet Take 500 mg by mouth every other day.    [provider]  methylPREDNISolone (MEDROL DOSEPAK) 4 MG TBPK tablet Take as directed 07/29/23   Jannifer Franklin, MD  tiZANidine (ZANAFLEX) 4 MG tablet Take 1 tablet (4 mg total) by mouth every 6 (six) hours as needed for muscle spasms. 07/29/23   Jannifer Franklin, MD      Allergies    Cephalexin and Chloroquine    Review of Systems   Review of Systems  Constitutional:  Negative for fever.  Cardiovascular:  Positive for chest pain.    Physical Exam Updated Vital Signs BP (!) 147/98   Pulse (!) 55   Temp 98 F (36.7 C) (Oral)   Resp 17   LMP 07/25/2023   SpO2 98%  Physical Exam Vitals and nursing note reviewed.  Constitutional:      General: She is not in acute distress.    Appearance: She is well-developed.  HENT:     Head: Normocephalic and atraumatic.  Eyes:      Conjunctiva/sclera: Conjunctivae normal.  Cardiovascular:     Rate and Rhythm: Normal rate and regular rhythm.     Heart sounds: No murmur heard. Pulmonary:     Effort: Pulmonary effort is normal. No respiratory distress.     Breath sounds: Normal breath sounds.  Chest:     Comments: Tenderness to palpation of left chest wall, radiating to left neck, no nuchal rigidity, range of motion intact.  Pain worse with truncal rotation to the left, as well as leftward rotation of the neck.  No midline tenderness to palpation. Abdominal:     Palpations: Abdomen is soft.     Tenderness: There is no abdominal tenderness.  Musculoskeletal:        General: No swelling.     Cervical back: Neck supple.  Skin:    General: Skin is warm and dry.     Capillary Refill: Capillary refill takes less than 2 seconds.  Neurological:     Mental Status: She is alert.  Psychiatric:        Mood and Affect: Mood normal.     ED Results / Procedures / Treatments   Labs (all labs ordered are listed, but only abnormal results are displayed) Labs Reviewed  BASIC METABOLIC PANEL - Abnormal; Notable for the following components:      Result Value  Glucose, Bld 126 (*)    All other components within normal limits  CBC - Abnormal; Notable for the following components:   WBC 2.5 (*)    Platelets 121 (*)    All other components within normal limits  HCG, SERUM, QUALITATIVE  TROPONIN I (HIGH SENSITIVITY)  TROPONIN I (HIGH SENSITIVITY)    EKG None  Radiology DG Chest 2 View Result Date: 08/26/2023 CLINICAL DATA:  43 year old female with left side chest pain onset this morning. EXAM: CHEST - 2 VIEW COMPARISON:  Chest CT 04/01/2023 and earlier. FINDINGS: AP and lateral views of the chest 0652 hours. Stable mild cardiomegaly. Other mediastinal contours are within normal limits. Visualized tracheal air column is within normal limits. Mildly lower lung volumes compared to last year. No pneumothorax, pulmonary edema,  pleural effusion or confluent lung opacity. No acute osseous abnormality identified. Negative visible bowel gas. IMPRESSION: Lower lung volumes. Stable mild cardiomegaly. No acute cardiopulmonary abnormality. Electronically Signed   By: Odessa Fleming M.D.   On: 08/26/2023 07:10    Procedures Procedures    Medications Ordered in ED Medications  metoCLOPramide (REGLAN) injection 10 mg (10 mg Intravenous Given 08/26/23 0947)  diphenhydrAMINE (BENADRYL) injection 25 mg (25 mg Intravenous Given 08/26/23 0948)  ketorolac (TORADOL) 15 MG/ML injection 15 mg (15 mg Intravenous Given 08/26/23 0948)  sodium chloride 0.9 % bolus 1,000 mL (1,000 mLs Intravenous New Bag/Given 08/26/23 6962)    ED Course/ Medical Decision Making/ A&P             HEART Score: 0                    Medical Decision Making Patient is a 43 year old female, here for chest pain, radiating to left neck, it has been going on for this past morning.  She denies any nausea, vomiting, had some shortness of breath initially due to the pain, but that has resolved.  She is PERC negative.  She has tenderness to palpation of the left chest wall, on exam.  Reports increased rest.  Will give her Toradol, to see if there is any relief, as well as obtain troponin, chest x-ray, EKG for further evaluation.  No evidence of any kind of rash, erythema, or ecchymosis  Amount and/or Complexity of Data Reviewed Labs: ordered.    Details: Troponin within normal limits, leukopenic, similar to past Radiology: ordered.    Details: Chest x-ray clear ECG/medicine tests:     Details: Normal sinus rhythm Discussion of management or test interpretation with external provider(s): Discussed with patient, she is feeling much better after the Toradol, headache cocktail.  Her troponin is within normal limits, and I believe that her pain is likely secondary to musculoskeletal strain, we will have her follow-up with her primary care doctor, we discussed conservative  treatment, follow-up with PCP and she voiced understanding.  She has no neurologic deficits on exam.  Risk Prescription drug management.    Final Clinical Impression(s) / ED Diagnoses Final diagnoses:  Chest pain, unspecified type  Muscle strain of chest wall, initial encounter    Rx / DC Orders ED Discharge Orders     None         Debra Colon, Harley [place], PA 08/26/23 1110    Alvira Monday, MD 08/26/23 2342

## 2023-08-26 NOTE — Discharge Instructions (Signed)
 Please follow-up with your primary care doctor, your blood work today was reassuring.  I believe that your pain is likely secondary to musculoskeletal issue.  Please use heating pad, as well as Epson salt bath, and Tylenol and ibuprofen to help with your pain.  Return if you have worsening chest pain, shortness of breath, nausea, or vomiting

## 2023-08-26 NOTE — ED Triage Notes (Signed)
 X 5 am, pt reports chest pain on left side, pt reports no cardiac issues and then reports irregular HR. Pt reports SOB with chest pain. Pt reports HA yesterday.

## 2023-09-02 ENCOUNTER — Other Ambulatory Visit: Payer: Self-pay | Admitting: Internal Medicine

## 2023-09-02 DIAGNOSIS — Z1231 Encounter for screening mammogram for malignant neoplasm of breast: Secondary | ICD-10-CM

## 2023-09-09 ENCOUNTER — Ambulatory Visit
Admission: RE | Admit: 2023-09-09 | Discharge: 2023-09-09 | Disposition: A | Discharge status: Home / Self Care | Payer: Medicaid Other | Source: Ambulatory Visit | Attending: Internal Medicine | Admitting: Internal Medicine

## 2023-09-09 DIAGNOSIS — Z1231 Encounter for screening mammogram for malignant neoplasm of breast: Secondary | ICD-10-CM

## 2023-12-22 ENCOUNTER — Ambulatory Visit (HOSPITAL_COMMUNITY)
Admission: EM | Admit: 2023-12-22 | Discharge: 2023-12-22 | Disposition: A | Discharge status: Home / Self Care | Attending: Family Medicine | Admitting: Family Medicine

## 2023-12-22 ENCOUNTER — Encounter (HOSPITAL_COMMUNITY): Payer: Self-pay

## 2023-12-22 DIAGNOSIS — G44201 Tension-type headache, unspecified, intractable: Secondary | ICD-10-CM

## 2023-12-22 DIAGNOSIS — M62838 Other muscle spasm: Secondary | ICD-10-CM | POA: Diagnosis not present

## 2023-12-22 MED ORDER — KETOROLAC TROMETHAMINE 30 MG/ML IJ SOLN
30.0000 mg | Freq: Once | INTRAMUSCULAR | Status: AC
  Administered 2023-12-22: 30 mg via INTRAMUSCULAR

## 2023-12-22 MED ORDER — KETOROLAC TROMETHAMINE 30 MG/ML IJ SOLN
INTRAMUSCULAR | Status: AC
  Filled 2023-12-22: qty 1

## 2023-12-22 MED ORDER — METHOCARBAMOL 500 MG PO TABS: 500.0000 mg | ORAL_TABLET | Freq: Two times a day (BID) | ORAL | 0 refills | Status: DC

## 2023-12-22 NOTE — ED Triage Notes (Signed)
 Pt c/o neck pain, headache, and bilateral ear pain since 06/01. States had a nose bleed x1 on 6/01 after neck started hurting. Denies injury. Took tylenol  yesterday with no relief.

## 2023-12-24 NOTE — ED Provider Notes (Signed)
 [facility]   [id number] 12/22/23 Arrival Time: 1244  ASSESSMENT & PLAN:  1. Acute intractable tension-type headache   2. Muscle spasms of neck    Meds ordered this encounter  Medications   ketorolac  (TORADOL ) 30 MG/ML injection 30 mg   methocarbamol  (ROBAXIN ) 500 MG tablet    Sig: Take 1 tablet (500 mg total) by mouth 2 (two) times daily.    Dispense:  20 tablet    Refill:  0   Normal neurological exam. Without fever, focal neuro logical deficits, nuchal rigidity, or change in vision. No indication for neurodiagnostic workup at this time. Discussed.  Recommend:  Follow-up Information     Charle Congo, MD.   Specialty: Internal Medicine Why: If worsening or failing to improve as anticipated. Contact information: [address] [place] [address] [phone]         [facility].   Specialty: Urgent Care Why: As needed. Contact information: [address] [place]  [address] [phone]                 Reviewed expectations re: course of current medical issues. Questions answered. Outlined signs and symptoms indicating need for more acute intervention. Patient verbalized understanding. After Visit Summary given.   SUBJECTIVE: History from: Patient Patient is able to give a clear and coherent history.  Brenda Cannon is a 43 y.o. female who presents with complaint of a bad headache. Describes as a throbbing feeling Onset gradual, approx 12/07/2023. Location: from neck to upper scalp. History of headaches: occasional. Precipitating factors include: none which have been determined. Neck feels stiff. Denies any trauma. She is able to sleep at night. Associated symptoms: Preceding aura: no. Nausea/vomiting: no. Vision changes: no. Increased sensitivity to light and to noises: no. Fever: no. Sinus pressure/congestion: no. Extremity weakness: no. Tylenol  yesterday without much  help. Current headache has limited normal daily activities. Denies dizziness, loss of balance, muscle weakness, numbness of extremities, speech difficulties, and vision problems. Ambulatory without difficulty. No recent travel.   OBJECTIVE:  Vitals:   12/22/23 1321  BP: 126/89  Pulse: 76  Resp: 18  Temp: 98.1 F (36.7 C)  TempSrc: Oral    General appearance: alert; NAD HENT: normocephalic; atraumatic Eyes: PERRLA; EOMI; conjunctivae normal Neck: supple with FROM but moves slowly; posterior neck musculature is tight and TTP Lungs: clear to auscultation bilaterally; unlabored respirations Heart: regular rate and rhythm Extremities: no edema; symmetrical with no gross deformities Skin: warm and dry Neurologic: alert; speech is fluent and clear without dysarthria or aphasia; CN 2-12 grossly intact; no facial droop; normal gait; normal symmetric reflexes; normal extremity strength and sensation throughout; bilateral upper and lower extremity sensation is grossly intact with 5/5 symmetric strength; normal grip strength; normal plantar and dorsi flexion bilaterally; normal finger to nose bilaterally; negative pronator drift; negative Romberg sign Psychological: alert and cooperative; normal mood and affect    Allergies  Allergen Reactions   Cephalexin  Itching and Rash   Chloroquine Hives    Past Medical History:  Diagnosis Date   Abnormal Pap smear    ascus with High Risk for HPV   Carpal tunnel syndrome    Cirrhosis (HCC)    De Quervain's tenosynovitis, left    Difficulty reading    Gestational diabetes    GDM with last pregnancy (2008)   History of gestational diabetes    History of hepatitis B 07/08/2005   History of malaria 07/08/2001   History of  thrombocytopenia    Kidney stones    Obese    Plantar fasciitis    Social History   Socioeconomic History   Marital status: Single    Spouse name: Not on file   Number of children: 3   Years of education: Not on  file   Highest education level: Not on file  Occupational History   Occupation: Animator  Tobacco Use   Smoking status: Never   Smokeless tobacco: Never  Vaping Use   Vaping status: Never Used  Substance and Sexual Activity   Alcohol use: No   Drug use: No   Sexual activity: Not Currently    Birth control/protection: None  Other Topics Concern   Not on file  Social History Narrative   ** Merged History Encounter **       Social Drivers of Health   Financial Resource Strain: Not on file  Food Insecurity: Medium Risk (09/25/2022)   Received from [facility]   Hunger Vital Sign    Within the past 12 months, you worried that your food would run out before you got money to buy more: Sometimes true    Within the past 12 months, the food you bought just didn't last and you didn't have money to get more. : Sometimes true  Transportation Needs: Not on file (09/25/2022)  Physical Activity: Not on file  Stress: Not on file (08/17/2019)  Social Connections: Not on file  Intimate Partner Violence: Low Risk  (10/22/2019)   Received from [facility]   Intimate Partner Violence    Insults You: Not on file    Threatens You: Not on file    Screams at [place]: Not on file    Physically Hurt: Not on file    Intimate Partner Violence Score: Not on file   Family History  Problem Relation Age of Onset   Hypertension Mother    Stroke Mother    Colon cancer Neg Hx    Esophageal cancer Neg Hx    Liver cancer Neg Hx    Past Surgical History:  Procedure Laterality Date   CESAREAN SECTION  2008   last pregnacy   CESAREAN SECTION  08/21/2011   Procedure: CESAREAN SECTION;  Surgeon: Granville Layer, MD;  Location: [facility];  Service: Gynecology;  Laterality: N/A;      Afton Albright, MD 12/24/23 1351

## 2024-02-06 ENCOUNTER — Other Ambulatory Visit: Payer: Self-pay

## 2024-02-06 ENCOUNTER — Ambulatory Visit (HOSPITAL_COMMUNITY)
Admission: EM | Admit: 2024-02-06 | Discharge: 2024-02-06 | Disposition: A | Discharge status: Home / Self Care | Attending: Emergency Medicine | Admitting: Emergency Medicine

## 2024-02-06 ENCOUNTER — Encounter (HOSPITAL_COMMUNITY): Payer: Self-pay | Admitting: *Deleted

## 2024-02-06 DIAGNOSIS — L02415 Cutaneous abscess of right lower limb: Secondary | ICD-10-CM

## 2024-02-06 DIAGNOSIS — H6991 Unspecified Eustachian tube disorder, right ear: Secondary | ICD-10-CM

## 2024-02-06 MED ORDER — AZELASTINE HCL 0.1 % NA SOLN: 2.0000 | Freq: Two times a day (BID) | NASAL | 0 refills | Status: AC

## 2024-02-06 MED ORDER — CETIRIZINE HCL 10 MG PO TABS: 10.0000 mg | ORAL_TABLET | Freq: Every day | ORAL | 0 refills | Status: DC

## 2024-02-06 MED ORDER — DOXYCYCLINE HYCLATE 100 MG PO CAPS: 100.0000 mg | ORAL_CAPSULE | Freq: Two times a day (BID) | ORAL | 0 refills | Status: DC

## 2024-02-06 NOTE — Discharge Instructions (Signed)
 Start taking doxycycline  twice daily for 10 days for abscess coverage. Apply warm compresses to the area to help promote decrease in swelling and drainage. If you notice increased swelling, spreading of redness, or lots of puslike drainage from the area return here for reevaluation.  Use azelastine nasal spray twice daily for relief of congestion related to eustachian tube dysfunction. Start taking cetirizine  once daily for additional relief of this.  Follow-up with your primary care provider or return here as needed.

## 2024-02-06 NOTE — ED Triage Notes (Signed)
 PT reports an abscess located in RT groin that started last night. Pt also has facial pain on the RT side of her face including the RT ear .and sore throat that also started last night.

## 2024-02-06 NOTE — ED Provider Notes (Signed)
 [facility]    CSN: [id number] Arrival date & time: 02/06/24  0803      History   Chief Complaint Chief Complaint  Patient presents with   Abscess   Sore Throat   Otalgia    HPI Brenda Cannon is a 43 y.o. female.   Patient presents with concerns for bump to her right inner thigh that she noticed last night.  Patient states that the area is tender and seems to have gotten larger since last night.  Patient denies any drainage from the area.  Patient also reports right-sided sinus pressure, right ear pain, and sore throat mainly on the right side that began last night.  Denies cough, fever, body aches, chills, chest pain, and shortness of breath.  Patient denies taking any medication for her symptoms.  The history is provided by the patient and medical records.  Abscess Sore Throat  Otalgia   Past Medical History:  Diagnosis Date   Abnormal Pap smear    ascus with High Risk for HPV   Carpal tunnel syndrome    Cirrhosis (HCC)    De Quervain's tenosynovitis, left    Difficulty reading    Gestational diabetes    GDM with last pregnancy (2008)   History of gestational diabetes    History of hepatitis B 07/08/2005   History of malaria 07/08/2001   History of thrombocytopenia    Kidney stones    Obese    Plantar fasciitis     Patient Active Problem List   Diagnosis Date Noted   Papanicolaou smear of cervix with positive high risk human papilloma virus (HPV) test 11/08/2020   Carpal tunnel syndrome, bilateral 05/31/2020   Type 2 diabetes mellitus with obesity (HCC) 03/24/2015   Metatarsal stress fracture of left foot 11/10/2013   Allergic rhinitis 05/26/2012   Carpal tunnel syndrome of left wrist 04/15/2011   History of malaria 03/04/2011   Chronic hepatitis B (HCC) 03/01/2011   H/O: C-section 03/01/2011   History of thrombocytopenia 03/01/2011    Past Surgical History:  Procedure Laterality Date   CESAREAN SECTION  2008   last pregnacy    CESAREAN SECTION  08/21/2011   Procedure: CESAREAN SECTION;  Surgeon: Glenys GORMAN Birk, MD;  Location: [facility];  Service: Gynecology;  Laterality: N/A;    OB History     Gravida  5   Para  3   Term  3   Preterm  0   AB  2   Living  3      SAB  1   IAB      Ectopic  0   Multiple  0   Live Births  3            Home Medications    Prior to Admission medications   Medication Sig Start Date End Date Taking? Authorizing Provider  azelastine (ASTELIN) 0.1 % nasal spray Place 2 sprays into both nostrils 2 (two) times daily. Use in each nostril as directed 02/06/24  Yes Johnie Flaming A, NP  cetirizine  (ZYRTEC  ALLERGY) 10 MG tablet Take 1 tablet (10 mg total) by mouth daily. 02/06/24  Yes Johnie, Alfreda Hammad A, NP  doxycycline  (VIBRAMYCIN ) 100 MG capsule Take 1 capsule (100 mg total) by mouth 2 (two) times daily. 02/06/24  Yes Johnie, Ziana Heyliger A, NP  metFORMIN  (GLUCOPHAGE ) 500 MG tablet Take 500 mg by mouth every other day.   Yes [provider]  omeprazole  (PRILOSEC) 20 MG capsule Take 20 mg by  mouth 2 (two) times daily. 08/28/23  Yes [provider]  Vitamin D, Ergocalciferol, (DRISDOL) 1.25 MG (50000 UNIT) CAPS capsule Take 50,000 Units by mouth once a week. 12/09/23  Yes [provider]    Family History Family History  Problem Relation Age of Onset   Hypertension Mother    Stroke Mother    Colon cancer Neg Hx    Esophageal cancer Neg Hx    Liver cancer Neg Hx     Social History Social History   Tobacco Use   Smoking status: Never   Smokeless tobacco: Never  Vaping Use   Vaping status: Never Used  Substance Use Topics   Alcohol use: No   Drug use: No     Allergies   Cephalexin  and Chloroquine   Review of Systems Review of Systems  HENT:  Positive for ear pain.    Per HPI  Physical Exam Triage Vital Signs ED Triage Vitals  Encounter Vitals Group     BP 02/06/24 0817 (!) 143/104     Girls Systolic BP Percentile --      Girls  Diastolic BP Percentile --      Boys Systolic BP Percentile --      Boys Diastolic BP Percentile --      Pulse Rate 02/06/24 0817 97     Resp 02/06/24 0817 20     Temp 02/06/24 0817 98.3 F (36.8 C)     Temp src --      SpO2 02/06/24 0817 97 %     Weight --      Height --      Head Circumference --      Peak Flow --      Pain Score 02/06/24 0814 6     Pain Loc --      Pain Education --      Exclude from Growth Chart --    No data found.  Updated Vital Signs BP (!) 143/104   Pulse 97   Temp 98.3 F (36.8 C)   Resp 20   LMP 01/31/2024   SpO2 97%   Visual Acuity Right Eye Distance:   Left Eye Distance:   Bilateral Distance:    Right Eye Near:   Left Eye Near:    Bilateral Near:     Physical Exam Vitals and nursing note reviewed.  Constitutional:      General: She is awake. She is not in acute distress.    Appearance: Normal appearance. She is well-developed and well-groomed. She is not ill-appearing.  HENT:     Right Ear: A middle ear effusion is present.     Left Ear: Tympanic membrane, ear canal and external ear normal.     Nose: Congestion and rhinorrhea present.     Mouth/Throat:     Mouth: Mucous membranes are moist.     Pharynx: Posterior oropharyngeal erythema and postnasal drip present. No oropharyngeal exudate.  Cardiovascular:     Rate and Rhythm: Normal rate and regular rhythm.  Pulmonary:     Effort: Pulmonary effort is normal.     Breath sounds: Normal breath sounds.  Musculoskeletal:       Legs:  Skin:    General: Skin is warm and dry.     Findings: Abscess present.     Comments: Approximately 1.5 cm area of induration noted to right upper inner thigh with surrounding erythema.  Neurological:     Mental Status: She is alert.  Psychiatric:  Behavior: Behavior is cooperative.      UC Treatments / Results  Labs (all labs ordered are listed, but only abnormal results are displayed) Labs Reviewed - No data to  display  EKG   Radiology No results found.  Procedures Procedures (including critical care time)  Medications Ordered in UC Medications - No data to display  Initial Impression / Assessment and Plan / UC Course  I have reviewed the triage vital signs and the nursing notes.  Pertinent labs & imaging results that were available during my care of the patient were reviewed by me and considered in my medical decision making (see chart for details).     Patient is overall well-appearing.  Vitals are stable.  Abscess of thigh Deferred I&D due to lack of fluctuance.  Prescribed doxycycline  for abscess coverage.  Recommended warm compresses and discussed return precautions related to this.  2.  Eustachian tube dysfunction Exam findings consistent with eustachian tube dysfunction.  Prescribed azelastine nasal spray and cetirizine  for relief of this.  Discussed follow-up and return precautions. Final Clinical Impressions(s) / UC Diagnoses   Final diagnoses:  Abscess of right thigh  Eustachian tube dysfunction, right     Discharge Instructions      Start taking doxycycline  twice daily for 10 days for abscess coverage. Apply warm compresses to the area to help promote decrease in swelling and drainage. If you notice increased swelling, spreading of redness, or lots of puslike drainage from the area return here for reevaluation.  Use azelastine nasal spray twice daily for relief of congestion related to eustachian tube dysfunction. Start taking cetirizine  once daily for additional relief of this.  Follow-up with your primary care provider or return here as needed.   ED Prescriptions     Medication Sig Dispense Auth. Provider   azelastine (ASTELIN) 0.1 % nasal spray Place 2 sprays into both nostrils 2 (two) times daily. Use in each nostril as directed 30 mL Johnie Flaming A, NP   cetirizine  (ZYRTEC  ALLERGY) 10 MG tablet Take 1 tablet (10 mg total) by mouth daily. 30 tablet  Johnie Flaming A, NP   doxycycline  (VIBRAMYCIN ) 100 MG capsule Take 1 capsule (100 mg total) by mouth 2 (two) times daily. 20 capsule Johnie Flaming A, NP      PDMP not reviewed this encounter.   Johnie Flaming A, NP 02/06/24 [id number]

## 2024-02-10 ENCOUNTER — Other Ambulatory Visit: Payer: Self-pay | Admitting: Nurse Practitioner

## 2024-02-10 DIAGNOSIS — K7469 Other cirrhosis of liver: Secondary | ICD-10-CM

## 2024-02-10 DIAGNOSIS — B181 Chronic viral hepatitis B without delta-agent: Secondary | ICD-10-CM

## 2024-02-25 ENCOUNTER — Ambulatory Visit
Admission: RE | Admit: 2024-02-25 | Discharge: 2024-02-25 | Disposition: A | Discharge status: Home / Self Care | Source: Ambulatory Visit | Attending: Nurse Practitioner | Admitting: Nurse Practitioner

## 2024-02-25 DIAGNOSIS — K7469 Other cirrhosis of liver: Secondary | ICD-10-CM

## 2024-02-25 DIAGNOSIS — B181 Chronic viral hepatitis B without delta-agent: Secondary | ICD-10-CM

## 2024-02-29 ENCOUNTER — Emergency Department (HOSPITAL_COMMUNITY): Admission: EM | Admit: 2024-02-29 | Discharge: 2024-02-29 | Disposition: A | Discharge status: Home / Self Care

## 2024-02-29 ENCOUNTER — Other Ambulatory Visit: Payer: Self-pay

## 2024-02-29 ENCOUNTER — Emergency Department (HOSPITAL_COMMUNITY)

## 2024-02-29 ENCOUNTER — Encounter (HOSPITAL_COMMUNITY): Payer: Self-pay

## 2024-02-29 DIAGNOSIS — R0789 Other chest pain: Secondary | ICD-10-CM | POA: Diagnosis not present

## 2024-02-29 DIAGNOSIS — M545 Low back pain, unspecified: Secondary | ICD-10-CM | POA: Diagnosis present

## 2024-02-29 LAB — CBC
HCT: 38.3 % (ref 36.0–46.0)
Hemoglobin: 12.3 g/dL (ref 12.0–15.0)
MCH: 28 pg (ref 26.0–34.0)
MCHC: 32.1 g/dL (ref 30.0–36.0)
MCV: 87.2 fL (ref 80.0–100.0)
Platelets: 149 K/uL — ABNORMAL LOW (ref 150–400)
RBC: 4.39 MIL/uL (ref 3.87–5.11)
RDW: 12.7 % (ref 11.5–15.5)
WBC: 3.6 K/uL — ABNORMAL LOW (ref 4.0–10.5)
nRBC: 0 % (ref 0.0–0.2)

## 2024-02-29 LAB — URINALYSIS, ROUTINE W REFLEX MICROSCOPIC
Bilirubin Urine: NEGATIVE
Glucose, UA: >=500 mg/dL — AB
Ketones, ur: NEGATIVE mg/dL
Leukocytes,Ua: NEGATIVE
Nitrite: NEGATIVE
Protein, ur: NEGATIVE mg/dL
Specific Gravity, Urine: 1.023 (ref 1.005–1.030)
pH: 5 (ref 5.0–8.0)

## 2024-02-29 LAB — COMPREHENSIVE METABOLIC PANEL WITH GFR
ALT: 22 U/L (ref 0–44)
AST: 23 U/L (ref 15–41)
Albumin: 3.8 g/dL (ref 3.5–5.0)
Alkaline Phosphatase: 56 U/L (ref 38–126)
Anion gap: 9 (ref 5–15)
BUN: 13 mg/dL (ref 6–20)
CO2: 23 mmol/L (ref 22–32)
Calcium: 8.9 mg/dL (ref 8.9–10.3)
Chloride: 104 mmol/L (ref 98–111)
Creatinine, Ser: 0.64 mg/dL (ref 0.44–1.00)
GFR, Estimated: >60 mL/min (ref >60)
Glucose, Bld: 307 mg/dL — ABNORMAL HIGH (ref 70–99)
Potassium: 3.6 mmol/L (ref 3.5–5.1)
Sodium: 136 mmol/L (ref 135–145)
Total Bilirubin: 0.9 mg/dL (ref 0.0–1.2)
Total Protein: 7.6 g/dL (ref 6.5–8.1)

## 2024-02-29 LAB — HCG, SERUM, QUALITATIVE: Preg, Serum: NEGATIVE

## 2024-02-29 LAB — LIPASE, BLOOD: Lipase: 46 U/L (ref 11–51)

## 2024-02-29 MED ORDER — NAPROXEN 500 MG PO TABS: 500.0000 mg | ORAL_TABLET | Freq: Two times a day (BID) | ORAL | 0 refills | Status: AC

## 2024-02-29 MED ORDER — KETOROLAC TROMETHAMINE 15 MG/ML IJ SOLN
15.0000 mg | Freq: Once | INTRAMUSCULAR | Status: AC
  Administered 2024-02-29: 15 mg via INTRAMUSCULAR
  Filled 2024-02-29: qty 1

## 2024-02-29 NOTE — ED Provider Notes (Signed)
 [place] [facility] AT [facility] Provider Note   CSN: [id number] Arrival date & time: 02/29/24  1439     Patient presents with: Abdominal Pain   Brenda Cannon is a 43 y.o. female.   43 year old female presents for evaluation of abdominal pain and back pain.  States its located in the upper abdomen and mid to lower back.  States it gets worse when she takes a deep breath.  She states it started this morning.  Also has some right foot swelling that she noted today.  She denies any chest pain.  Is not having any nausea or vomiting or urine difficulties.  Denies any other symptoms or concerns at this time.   Abdominal Pain Associated symptoms: no chest pain, no chills, no cough, no dysuria, no fever, no hematuria, no shortness of breath, no sore throat and no vomiting        Prior to Admission medications   Medication Sig Start Date End Date Taking? Authorizing Provider  naproxen  (NAPROSYN ) 500 MG tablet Take 1 tablet (500 mg total) by mouth 2 (two) times daily for 7 days. 02/29/24 03/07/24 Yes Tequilla Cousineau L, DO  azelastine  (ASTELIN ) 0.1 % nasal spray Place 2 sprays into both nostrils 2 (two) times daily. Use in each nostril as directed 02/06/24   Johnie Flaming A, NP  cetirizine  (ZYRTEC  ALLERGY) 10 MG tablet Take 1 tablet (10 mg total) by mouth daily. 02/06/24   Johnie Flaming A, NP  doxycycline  (VIBRAMYCIN ) 100 MG capsule Take 1 capsule (100 mg total) by mouth 2 (two) times daily. 02/06/24   Johnie Flaming A, NP  metFORMIN  (GLUCOPHAGE ) 500 MG tablet Take 500 mg by mouth every other day.    [provider]  omeprazole  (PRILOSEC) 20 MG capsule Take 20 mg by mouth 2 (two) times daily. 08/28/23   [provider]  Vitamin D, Ergocalciferol, (DRISDOL) 1.25 MG (50000 UNIT) CAPS capsule Take 50,000 Units by mouth once a week. 12/09/23   [provider]    Allergies: Cephalexin  and Chloroquine    Review of Systems  Constitutional:   Negative for chills and fever.  HENT:  Negative for ear pain and sore throat.   Eyes:  Negative for pain and visual disturbance.  Respiratory:  Negative for cough and shortness of breath.   Cardiovascular:  Negative for chest pain and palpitations.  Gastrointestinal:  Positive for abdominal pain. Negative for vomiting.  Genitourinary:  Negative for dysuria and hematuria.  Musculoskeletal:  Negative for arthralgias and back pain.  Skin:  Negative for color change and rash.  Neurological:  Negative for seizures and syncope.  All other systems reviewed and are negative.   Updated Vital Signs BP (!) 149/99 (BP Location: Left Arm)   Pulse 82   Temp 98.8 F (37.1 C) (Oral)   Resp 18   LMP 01/31/2024   SpO2 98%   Physical Exam Vitals and nursing note reviewed.  Constitutional:      General: She is not in acute distress.    Appearance: She is well-developed. She is not ill-appearing.  HENT:     Head: Normocephalic and atraumatic.  Eyes:     Conjunctiva/sclera: Conjunctivae normal.  Cardiovascular:     Rate and Rhythm: Normal rate and regular rhythm.     Heart sounds: No murmur heard. Pulmonary:     Effort: Pulmonary effort is normal. No respiratory distress.     Breath sounds: Normal breath sounds.  Abdominal:     General:  Abdomen is flat.     Palpations: Abdomen is soft.     Tenderness: There is no abdominal tenderness.  Musculoskeletal:        General: No swelling.     Cervical back: Neck supple.  Skin:    General: Skin is warm and dry.     Capillary Refill: Capillary refill takes less than 2 seconds.  Neurological:     Mental Status: She is alert.  Psychiatric:        Mood and Affect: Mood normal.     (all labs ordered are listed, but only abnormal results are displayed) Labs Reviewed  COMPREHENSIVE METABOLIC PANEL WITH GFR - Abnormal; Notable for the following components:      Result Value   Glucose, Bld 307 (*)    All other components within normal limits  CBC  - Abnormal; Notable for the following components:   WBC 3.6 (*)    Platelets 149 (*)    All other components within normal limits  URINALYSIS, ROUTINE W REFLEX MICROSCOPIC - Abnormal; Notable for the following components:   Glucose, UA >=500 (*)    Hgb urine dipstick MODERATE (*)    Bacteria, UA RARE (*)    All other components within normal limits  LIPASE, BLOOD  HCG, SERUM, QUALITATIVE    EKG: EKG Interpretation Date/Time:  Sunday February 29 2024 16:26:06 EDT Ventricular Rate:  62 PR Interval:  167 QRS Duration:  88 QT Interval:  420 QTC Calculation: 427 R Axis:   20  Text Interpretation: Sinus rhythm LVH by voltage Borderline T abnormalities, diffuse leads Compared with prior EKG from 08/26/2023 Confirmed by Gennaro Bouchard ([id number]) on 02/29/2024 4:31:27 PM  Radiology: [facility] Chest 1 View Result Date: 02/29/2024 CLINICAL DATA:  Shortness of breath. EXAM: CHEST  1 VIEW COMPARISON:  08/26/2023. FINDINGS: Stable cardiomegaly. No overt pulmonary edema, focal consolidation, pleural effusion, or pneumothorax. No acute osseous abnormality. IMPRESSION: 1. No acute cardiopulmonary findings. 2. Stable cardiomegaly. Electronically Signed   By: Harrietta Sherry M.D.   On: 02/29/2024 17:02     Procedures   Medications Ordered in the ED  ketorolac  (TORADOL ) 15 MG/ML injection 15 mg (15 mg Intramuscular Given 02/29/24 1627)                                    Medical Decision Making Cardiac monitor interpretation: Sinus rhythm, no ectopy  Patient's lab workup reviewed by me and unremarkable.  EKG and chest x-ray are within normal limits and vital signs are stable.  Was given Toradol  for pain.  Thing is likely musculoskeletal in nature.  Will start her on naproxen  and advised Tylenol  as needed.  Advise close up with primary care and otherwise return to the ER for any worsening symptoms.  Feels comfortable to plan be discharged home.  Problems Addressed: Lumbar back pain: acute illness or  injury Musculoskeletal chest pain: acute illness or injury  Amount and/or Complexity of Data Reviewed External Data Reviewed: notes.    Details: Prior outpatient records reviewed and patient with previous visit at urgent care for abscess Labs: ordered. Decision-making details documented in ED Course.    Details: Ordered and reviewed by me and unremarkable Radiology: ordered and independent interpretation performed. Decision-making details documented in ED Course.    Details: Ordered and interpreted independently radiology Chest x-ray: Shows no acute abnormality ECG/medicine tests: ordered and independent interpretation performed. Decision-making details documented in ED Course.  Details: Ordered and interpreted by me in the absence of cardiology and shows sinus rhythm, no STEMI, no acute change when compared to prior  Risk OTC drugs. Prescription drug management.    Final diagnoses:  Musculoskeletal chest pain  Lumbar back pain    ED Discharge Orders          Ordered    naproxen  (NAPROSYN ) 500 MG tablet  2 times daily        02/29/24 1705               Tristian Bouska L, DO 02/29/24 1952

## 2024-02-29 NOTE — Discharge Instructions (Signed)
 Take your naprosyn  as prescribed. You can use tylenol  as needed for pain. You chest xray and EKG were normal in the emergency department. Your pain is likely from soreness in your muscles. Follow up with your primary care doctor as needed if your symptoms don't improve.

## 2024-02-29 NOTE — ED Triage Notes (Signed)
 Pt c/o abdominal pain on both sides that started yesterday. Denies any n/v/d, describes it as sharp and hurts when she breathes. Pt also c/o right foot swelling that started yesterday as well. Denies any recent injury to that foot.

## 2024-03-15 ENCOUNTER — Other Ambulatory Visit: Payer: Self-pay | Admitting: Nurse Practitioner

## 2024-03-15 DIAGNOSIS — D376 Neoplasm of uncertain behavior of liver, gallbladder and bile ducts: Secondary | ICD-10-CM

## 2024-03-22 ENCOUNTER — Ambulatory Visit
Admission: RE | Admit: 2024-03-22 | Discharge: 2024-03-22 | Disposition: A | Discharge status: Home / Self Care | Source: Ambulatory Visit | Attending: Nurse Practitioner | Admitting: Nurse Practitioner

## 2024-03-22 DIAGNOSIS — D376 Neoplasm of uncertain behavior of liver, gallbladder and bile ducts: Secondary | ICD-10-CM

## 2024-03-22 MED ORDER — IOPAMIDOL (ISOVUE-370) INJECTION 76%
75.0000 mL | Freq: Once | INTRAVENOUS | Status: AC | PRN
  Administered 2024-03-22: 75 mL via INTRAVENOUS

## 2024-05-16 ENCOUNTER — Encounter (HOSPITAL_COMMUNITY): Payer: Self-pay

## 2024-05-16 ENCOUNTER — Ambulatory Visit (INDEPENDENT_AMBULATORY_CARE_PROVIDER_SITE_OTHER)

## 2024-05-16 ENCOUNTER — Ambulatory Visit (HOSPITAL_COMMUNITY): Admission: EM | Admit: 2024-05-16 | Discharge: 2024-05-16 | Disposition: A | Discharge status: Home / Self Care

## 2024-05-16 DIAGNOSIS — G44209 Tension-type headache, unspecified, not intractable: Secondary | ICD-10-CM

## 2024-05-16 DIAGNOSIS — R0602 Shortness of breath: Secondary | ICD-10-CM

## 2024-05-16 DIAGNOSIS — J209 Acute bronchitis, unspecified: Secondary | ICD-10-CM

## 2024-05-16 MED ORDER — ALBUTEROL SULFATE HFA 108 (90 BASE) MCG/ACT IN AERS
INHALATION_SPRAY | RESPIRATORY_TRACT | Status: AC
  Filled 2024-05-16: qty 6.7

## 2024-05-16 MED ORDER — KETOROLAC TROMETHAMINE 30 MG/ML IJ SOLN
INTRAMUSCULAR | Status: AC
  Filled 2024-05-16: qty 1

## 2024-05-16 MED ORDER — PROMETHAZINE-DM 6.25-15 MG/5ML PO SYRP: 5.0000 mL | ORAL_SOLUTION | Freq: Every evening | ORAL | 0 refills | Status: DC | PRN

## 2024-05-16 MED ORDER — BUDESONIDE-FORMOTEROL FUMARATE 80-4.5 MCG/ACT IN AERO: 2.0000 | INHALATION_SPRAY | Freq: Two times a day (BID) | RESPIRATORY_TRACT | 0 refills | Status: AC

## 2024-05-16 MED ORDER — GUAIFENESIN ER 600 MG PO TB12: 600.0000 mg | ORAL_TABLET | Freq: Two times a day (BID) | ORAL | 0 refills | Status: DC

## 2024-05-16 MED ORDER — ALBUTEROL SULFATE HFA 108 (90 BASE) MCG/ACT IN AERS
2.0000 | INHALATION_SPRAY | Freq: Once | RESPIRATORY_TRACT | Status: AC
Start: 2024-05-16 — End: 2024-05-16
  Administered 2024-05-16: 2 via RESPIRATORY_TRACT

## 2024-05-16 MED ORDER — KETOROLAC TROMETHAMINE 30 MG/ML IJ SOLN
15.0000 mg | Freq: Once | INTRAMUSCULAR | Status: AC
  Administered 2024-05-16: 15 mg via INTRAMUSCULAR

## 2024-05-16 NOTE — ED Triage Notes (Signed)
 Patient here today with c/o cough, headache, nasal congestion, ST, night chills, and lower back ache X 1 week. Her son also has the same symptoms. Patient has been taking Tylenol  and a nasal spray with some relief.   Patient also asked for a refill on her Vitamin D.

## 2024-05-16 NOTE — Discharge Instructions (Addendum)
 You have bronchitis which is inflammation of the upper airways in your lungs due to a virus.  Your chest x-ray looks great, no signs of pneumonia.  We will treat the inflammation with a steroid inhaler which will help with your shortness of breath and reduce inflammation of the lungs.  Use Symbicort inhaler 2 puffs every 12 hours for the next 5 to 7 days or until your symptoms are improving.  You may also use albuterol inhaler (inhaler we gave you in the clinic) for rescue shortness of breath relief every 4-6 hours as needed in between doses of the Symbicort inhaler (steroid inhaler).  Rinse out your mouth after using the inhaler by swishing and swallowing water or mouth rinse.  We gave you an injection of ketorolac  in the clinic today which is a strong anti-inflammatory medication similar to ibuprofen . Do not take any ibuprofen /naproxen /Aleve /aspirin/other NSAIDs for the next 24 hours.   Use guaifenesin  (plain mucinex ) to break up congestion in nose/chest so that you are able to excrete easier. Drink plenty of fluids to stay well hydrated while taking mucinex  so that it works well in the body.   Promethazine  DM cough syrup may be used at bedtime for coughing, this medicine will cause you to be sleepy so only take at bedtime.   If you develop any new or worsening symptoms or if your symptoms do not start to improve, please return here or follow-up with your primary care provider. If your symptoms are severe, please go to the emergency room.

## 2024-05-16 NOTE — ED Provider Notes (Signed)
 [facility]    CSN: [id number] Arrival date & time: 05/16/24  0801      History   Chief Complaint Chief Complaint  Patient presents with  . Cough    HPI [name] is a 43 y.o. female.   [name] is a 43 y.o. female presenting for chief complaint of Cough Yellow mucous All the time worse    Cough   Past Medical History:  Diagnosis Date  . Abnormal Pap smear    ascus with High Risk for HPV  . Carpal tunnel syndrome   . Cirrhosis (HCC)   . De Quervain's tenosynovitis, left   . Difficulty reading   . Gestational diabetes    GDM with last pregnancy (2008)  . History of gestational diabetes   . History of hepatitis B 07/08/2005  . History of malaria 07/08/2001  . History of thrombocytopenia   . Kidney stones   . Obese   . Plantar fasciitis     Patient Active Problem List   Diagnosis Date Noted  . Papanicolaou smear of cervix with positive high risk human papilloma virus (HPV) test 11/08/2020  . Carpal tunnel syndrome, bilateral 05/31/2020  . Type 2 diabetes mellitus with obesity 03/24/2015  . Metatarsal stress fracture of left foot 11/10/2013  . Allergic rhinitis 05/26/2012  . 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 History of malaria 03/04/2011  . Chronic hepatitis B (HCC) 03/01/2011  . H/O: C-section 03/01/2011  . History of thrombocytopenia 03/01/2011    Past Surgical History:  Procedure Laterality Date  . CESAREAN SECTION  2008   last pregnacy  . CESAREAN SECTION  08/21/2011   Procedure: CESAREAN SECTION;  Surgeon: Glenys GORMAN Birk, MD;  Location: [facility];  Service: Gynecology;  Laterality: N/A;    OB History     Gravida  5   Para  3   Term  3   Preterm  0   AB  2   Living  3      SAB  1   IAB      Ectopic  0   Multiple  0   Live Births  3            Home Medications    Prior to Admission medications   Medication Sig Start Date End Date Taking? Authorizing Provider  azelastine   (ASTELIN ) 0.1 % nasal spray Place 2 sprays into both nostrils 2 (two) times daily. Use in each nostril as directed 02/06/24   Johnie Flaming A, NP  cetirizine  (ZYRTEC  ALLERGY) 10 MG tablet Take 1 tablet (10 mg total) by mouth daily. 02/06/24   Johnie Flaming A, NP  metFORMIN  (GLUCOPHAGE ) 500 MG tablet Take 500 mg by mouth every other day.    [provider]  omeprazole  (PRILOSEC) 20 MG capsule Take 20 mg by mouth 2 (two) times daily. 08/28/23   [provider]  tenofovir (VIREAD) 300 MG tablet Take 300 mg by mouth daily.    [provider]  Vitamin D, Ergocalciferol, (DRISDOL) 1.25 MG (50000 UNIT) CAPS capsule Take 50,000 Units by mouth once a week. 12/09/23   [provider]    Family History Family History  Problem Relation Age of Onset  . Hypertension Mother   . Stroke Mother   . Colon cancer Neg Hx   . Esophageal cancer Neg Hx   . Liver cancer Neg Hx     Social History Social History   Tobacco Use  .  Smoking status: Never  . Smokeless tobacco: Never  Vaping Use  . Vaping status: Never Used  Substance Use Topics  . Alcohol use: No  . Drug use: No     Allergies   Cephalexin  and Chloroquine   Review of Systems Review of Systems  Respiratory:  Positive for cough.      Physical Exam Triage Vital Signs ED Triage Vitals [05/16/24 0820]  Encounter Vitals Group     BP (!) 136/91     Girls Systolic BP Percentile      Girls Diastolic BP Percentile      Boys Systolic BP Percentile      Boys Diastolic BP Percentile      Pulse Rate 65     Resp 16     Temp 98.4 F (36.9 C)     Temp Source Oral     SpO2 98 %     Weight      Height      Head Circumference      Peak Flow      Pain Score 8     Pain Loc      Pain Education      Exclude from Growth Chart    No data found.  Updated Vital Signs BP (!) 136/91 (BP Location: Right Arm)   Pulse 65   Temp 98.4 F (36.9 C) (Oral)   Resp 16   LMP 05/15/2024 (Exact Date)   SpO2 98%    Visual Acuity Right Eye Distance:   Left Eye Distance:   Bilateral Distance:    Right Eye Near:   Left Eye Near:    Bilateral Near:     Physical Exam   UC Treatments / Results  Labs (all labs ordered are listed, but only abnormal results are displayed) Labs Reviewed - No data to display  EKG   Radiology No results found.  Procedures Procedures (including critical care time)  Medications Ordered in UC Medications - No data to display  Initial Impression / Assessment and Plan / UC Course  I have reviewed the triage vital signs and the nursing notes.  Pertinent labs & imaging results that were available during my care of the patient were reviewed by me and considered in my medical decision making (see chart for details).     *** Final Clinical Impressions(s) / UC Diagnoses   Final diagnoses:  None   Discharge Instructions   None    ED Prescriptions   None    PDMP not reviewed this encounter.

## 2024-05-21 ENCOUNTER — Other Ambulatory Visit

## 2024-05-21 ENCOUNTER — Encounter: Payer: Self-pay | Admitting: Nurse Practitioner

## 2024-05-21 ENCOUNTER — Ambulatory Visit: Admitting: Nurse Practitioner

## 2024-05-21 VITALS — BP 136/96 | HR 68 | Ht 59.0 in | Wt 197.0 lb

## 2024-05-21 DIAGNOSIS — B181 Chronic viral hepatitis B without delta-agent: Secondary | ICD-10-CM | POA: Diagnosis not present

## 2024-05-21 LAB — COMPREHENSIVE METABOLIC PANEL WITH GFR
ALT: 19 U/L (ref 0–35)
AST: 19 U/L (ref 0–37)
Albumin: 4.3 g/dL (ref 3.5–5.2)
Alkaline Phosphatase: 51 U/L (ref 39–117)
BUN: 11 mg/dL (ref 6–23)
CO2: 29 meq/L (ref 19–32)
Calcium: 9.2 mg/dL (ref 8.4–10.5)
Chloride: 104 meq/L (ref 96–112)
Creatinine, Ser: 0.74 mg/dL (ref 0.40–1.20)
GFR: 99.81 mL/min (ref >60.00)
Glucose, Bld: 94 mg/dL (ref 70–99)
Potassium: 3.7 meq/L (ref 3.5–5.1)
Sodium: 140 meq/L (ref 135–145)
Total Bilirubin: 0.4 mg/dL (ref 0.2–1.2)
Total Protein: 7.5 g/dL (ref 6.0–8.3)

## 2024-05-21 LAB — PROTIME-INR
INR: 1.2 ratio — ABNORMAL HIGH (ref 0.8–1.0)
Prothrombin Time: 12.5 s (ref 9.6–13.1)

## 2024-05-21 LAB — CBC
HCT: 37.8 % (ref 36.0–46.0)
Hemoglobin: 12.6 g/dL (ref 12.0–15.0)
MCHC: 33.3 g/dL (ref 30.0–36.0)
MCV: 83.3 fl (ref 78.0–100.0)
Platelets: 160 K/uL (ref 150.0–400.0)
RBC: 4.54 Mil/uL (ref 3.87–5.11)
RDW: 14 % (ref 11.5–15.5)
WBC: 3.1 K/uL — ABNORMAL LOW (ref 4.0–10.5)

## 2024-05-21 NOTE — Patient Instructions (Signed)
 Your provider has requested that you go to the basement level for lab work before leaving today. Press B on the elevator. The lab is located at the first door on the left as you exit the elevator.  Due to recent changes in healthcare laws, you may see the results of your imaging and laboratory studies on MyChart before your provider has had a chance to review them.  We understand that in some cases there may be results that are confusing or concerning to you. Not all laboratory results come back in the same time frame and the provider may be waiting for multiple results in order to interpret others.  Please give us  48 hours in order for your provider to thoroughly review all the results before contacting the office for clarification of your results.   Follow up with Dr Shelda in regards to your elevated blood pressure.   You have been scheduled for an endoscopy. Please follow written instructions given to you at your visit today.  If you use inhalers (even only as needed), please bring them with you on the day of your procedure.  If you take any of the following medications, they will need to be adjusted prior to your procedure:   DO NOT TAKE 7 DAYS PRIOR TO TEST- Trulicity (dulaglutide) Ozempic, Wegovy (semaglutide) Mounjaro, Zepbound (tirzepatide) Bydureon Bcise (exanatide extended release)  DO NOT TAKE 1 DAY PRIOR TO YOUR TEST Rybelsus (semaglutide) Adlyxin (lixisenatide) Victoza (liraglutide) Byetta (exanatide) ___________________________________________________________________________  I appreciate the opportunity to care for you. Elida Shawl, CRNP

## 2024-05-21 NOTE — Progress Notes (Signed)
 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   She was last seen in our office by Dr. Leigh 04/11/2021 for follow-up regarding chronic hepatitis B cirrhosis.  At that time, it was noted that she was initially diagnosed with chronic hepatitis B infection in 2007 when she was pregnant with her first child and she declined treatment at that time.  She subsequently developed cirrhosis identified per CT in 2016 and CTA 07/2020 also showed evidence of cirrhosis.  She underwent an EGD to assess for varices 05/11/2021 which was negative for esophageal or gastric varices and the duodenum was normal.  A repeat surveillance EGD in 2 years was recommended.  She was also referred to [facility]. Initiated on Tenofovir DF 06/2021.   She was last seen by Brenda Quest NP at [facility] 02/10/2024. At that time, the patient was adherent with taking Tenofovir DF since 09/2022, prior to that date she had been off  Tenofovir DF for a few months due to loss of insurance. MELD 9. CPT A. Patient endorsed having chronic chest pain, underwent cardiology evaluation including a 14 day monitor without identifying any arrhythmias. Labs consistent with MELD 3.0: 7. RUQ sonogram 02/25/2024 identified evidence of hepatic steatosis with a 0.6 x 0.7 x 1 cm mass in the right liver lobe, possible hemangioma and the gallbladder was normal.  CTAP with and without contrast 03/22/2024 identified cirrhosis, a tiny nonenhancing cyst versus 4 filling hemangioma in the right liver lobe measuring 1 cm and small varices throughout the LUQ and ventral abdomen without ascites.  She was advised to follow-up with Dr. Leigh regarding LUQ varices.    Patient noted  having chest and lumbar back pain for which she went to the ED 02/29/2024 and was prescribed Naproxen  500 mg twice daily.  She denies having any further chest pain or back pain since then.  It is unclear if she took Naproxen .   She denies having any nausea or vomiting.  No upper or lower abdominal pain.  No GERD symptoms or dysphagia.  On Omeprazole  20 mg twice daily.  She is passing normal brown formed stool daily.  No bloody or black stools.  No history of ascites or lower extremity swelling.  No issues with confusion or difficulty with concentration.  She was recently seen at urgent care 05/16/2024 with a cough and headaches, diagnosed with acute vital bronchitis.  She received albuterol and Symbicort inhalers and her cough and shortness of breath significantly improved.  She also received Ketorolac  injection.  She denies having any chest pain or further shortness of breath.  No NSAID use.  No alcohol use.  Non-smoker.  No known family history of hepatitis B or liver disease.   Labs 11/06/2020: Hepatitis B surface antigen positive.  Labs 11/20/2020: Hepatitis B core IgM positive.  Hep B core total antibody positive.  Hep B DNA 11,400.  Hepatitis B surface antibody < 3.1.  Labs 02/20/2021: Hep B core IgM reactive.  Hep B core total antibody reactive.  Hepatitis C antibody nonreactive. Labs 03/21/2021: Hep B E antigen nonreactive.  Hep B E antibody reactive.  Hepatitis A total antibody reactive.  Ceruloplasmin 21.  1 antitrypsin 144.  AFP 10.4. Labs 03/30/2021: Iron 54.  Ferritin 43.  SMA < 20. ANA negative. IgG 1,351.  Hep C antibody nonreactive. Labs 04/09/2021: HIV nonreactive.  Hep B DNA 139,000,000. Labs 04/01/2023: CR 0.75, NA 139, ALB 4.3, T. bili 0.6, alk phos 48, AST 19, ALT 18, INR 1.0, WBC 3.2, Hgb 12.7, HCT 39.5, PLT 117, AFP 4.4, HBV DNA <10 detected  Labs 02/10/2024: Hep B DNA undetectable.  AFP 2.1.  INR 1.0.  Sodium 139.  Potassium 3.7.  BUN 12.  Creatinine 0.78.  Albumin 4.3.  Total bili 0.4.   Alk phos 68.  AST 20.  ALT 19.     Latest Ref Rng & Units 02/29/2024    3:19 PM 08/26/2023    6:40 AM 06/17/2023    3:40 PM  CBC  WBC 4.0 - 10.5 K/uL 3.6  2.5  2.9   Hemoglobin 12.0 - 15.0 g/dL 87.6  87.0  87.3   Hematocrit 36.0 - 46.0 % 38.3  41.1  38.4   Platelets 150 - 400 K/uL 149  121  119         Latest Ref Rng & Units 02/29/2024    3:19 PM 08/26/2023    6:40 AM 06/17/2023    3:40 PM  CMP  Glucose 70 - 99 mg/dL 692  873  855   BUN 6 - 20 mg/dL 13  17  14    Creatinine 0.44 - 1.00 mg/dL 9.35  9.29  9.25   Sodium 135 - 145 mmol/L 136  135  138   Potassium 3.5 - 5.1 mmol/L 3.6  3.7  3.2   Chloride 98 - 111 mmol/L 104  105  106   CO2 22 - 32 mmol/L 23  23  24    Calcium 8.9 - 10.3 mg/dL 8.9  9.0  9.0   Total Protein 6.5 - 8.1 g/dL 7.6     Total Bilirubin 0.0 - 1.2 mg/dL 0.9     Alkaline Phos 38 - 126 U/L 56     AST 15 - 41 U/L 23     ALT 0 - 44 U/L 22       CTAP with contrast 03/22/2024: FINDINGS: Lower chest: No acute abnormality.   Hepatobiliary: Coarse, nodular cirrhotic morphology of the liver. No suspicious focal liver lesion. Tiny nonenhancing cyst or very poorly filling hemangioma in the anterior right lobe of the liver measuring 1.0 cm, generally corresponding to prior ultrasound finding (series 21, image 38). No gallstones, gallbladder wall thickening, or biliary dilatation.   Pancreas: Unremarkable. No pancreatic ductal dilatation or surrounding inflammatory changes.   Spleen: Normal in size without significant abnormality.   Adrenals/Urinary Tract: Adrenal glands are unremarkable. Kidneys are normal, without renal calculi, solid lesion, or hydronephrosis.   Stomach/Bowel: Stomach is within normal limits. No evidence of bowel wall thickening, distention, or inflammatory changes.   Vascular/Lymphatic: Small varices throughout the left upper quadrant and ventral abdomen (series 21, image 65). No enlarged abdominal lymph nodes.   Other: No abdominal wall  hernia or abnormality. No ascites.   Musculoskeletal: No acute or significant osseous findings.   IMPRESSION: 1. Cirrhosis.  No suspicious focal liver lesion. 2. Tiny nonenhancing cyst or very poorly filling hemangioma in the anterior right lobe of the liver measuring 1.0 cm, generally corresponding to prior ultrasound finding. This is benign and requires no further follow-up or characterization. 3. Small varices throughout the left upper quadrant and ventral abdomen. No ascites.   RUQ sono 02/25/2024:  Gallbladder:   No gallstones or wall thickening visualized. No  sonographic Beverley sign noted by sonographer.   Common bile duct:   Diameter: 2.2 mm   Liver:   Increased echotexture. There is a 0.6 x 0.7 x 1 cm hyperechoic mass in the right lobe liver. Portal vein is patent on color Doppler imaging with normal direction of blood flow towards the liver.   Other: None.   IMPRESSION: 1. No acute abnormality identified. 2. Increased echotexture of the liver. This is a nonspecific finding but can be seen in fatty infiltration of liver. 3. 0.6 x 0.7 x 1 cm hyperechoic mass in the right lobe liver. This may represent a hemangioma. Recommend follow-up ultrasound in 6 months to assess for stability.  GI PROCEDURES:    EGD 05/11/2021: - Esophagogastric landmarks identified.  - Normal esophagus otherwise - no varices.  - Normal stomach. No varices. - Normal duodenal bulb and second portion of the duodenum. - Repeat surveillance EGD in 2 years  Past Medical History:  Diagnosis Date   Abnormal Pap smear    ascus with High Risk for HPV   Carpal tunnel syndrome    Cirrhosis (HCC)    De Quervain's tenosynovitis, left    Difficulty reading    Gestational diabetes    GDM with last pregnancy (2008)   History of gestational diabetes    History of hepatitis B 07/08/2005   History of malaria 07/08/2001   History of thrombocytopenia    Kidney stones    Obese    Plantar  fasciitis    Past Surgical History:  Procedure Laterality Date   CESAREAN SECTION  2008   last pregnacy   CESAREAN SECTION  08/21/2011   Procedure: CESAREAN SECTION;  Surgeon: Glenys GORMAN Birk, MD;  Location: [facility];  Service: Gynecology;  Laterality: N/A;   Social History: Non-smoker.  No alcohol use.  No drug use.  Family History: Mother with hypertension and CVA.  No known family history of esophageal, gastric, colon or liver cancer.   Allergies  Allergen Reactions   Cephalexin  Itching and Rash   Chloroquine Hives      Outpatient Encounter Medications as of 05/21/2024  Medication Sig   azelastine  (ASTELIN ) 0.1 % nasal spray Place 2 sprays into both nostrils 2 (two) times daily. Use in each nostril as directed   budesonide-formoterol (SYMBICORT) 80-4.5 MCG/ACT inhaler Inhale 2 puffs into the lungs in the morning and at bedtime.   cetirizine  (ZYRTEC  ALLERGY) 10 MG tablet Take 1 tablet (10 mg total) by mouth daily.   fluticasone  (FLONASE ) 50 MCG/ACT nasal spray Place 1 spray into both nostrils daily.   guaiFENesin  (MUCINEX ) 600 MG 12 hr tablet Take 1 tablet (600 mg total) by mouth 2 (two) times daily.   metFORMIN  (GLUCOPHAGE ) 500 MG tablet Take 500 mg by mouth every other day.   omeprazole  (PRILOSEC) 20 MG capsule Take 20 mg by mouth 2 (two) times daily.   promethazine -dextromethorphan (PROMETHAZINE -DM) 6.25-15 MG/5ML syrup Take 5 mLs by mouth at bedtime as needed for cough.   tenofovir (VIREAD) 300 MG tablet Take 300 mg by mouth daily.   Vitamin D, Ergocalciferol, (DRISDOL) 1.25 MG (50000 UNIT) CAPS capsule Take 50,000 Units by mouth once a week.   No facility-administered encounter medications on file as of 05/21/2024.   REVIEW OF SYSTEMS:  Gen: Denies fever, sweats or chills. No weight loss.  CV: Denies chest pain, palpitations or edema. Resp: See HPI. GI: Denies heartburn, dysphagia, stomach or lower abdominal pain. No diarrhea or constipation.  GU: Denies urinary burning,  blood in urine, increased urinary frequency or incontinence. MS: No current back pain. Derm: Denies rash, itchiness, skin lesions or unhealing ulcers. Psych: Denies depression, anxiety, memory loss or confusion. Heme: Denies bruising, easy bleeding. Neuro:  Denies headaches, dizziness or paresthesias. Endo:  Denies any problems with DM, thyroid  or adrenal function.  PHYSICAL EXAM: BP (!) 136/96   Pulse 68   Ht 4' 11 (1.499 m) Comment: height measured without shoes  Wt 197 lb (89.4 kg)   LMP 05/15/2024 (Exact Date)   BMI 39.79 kg/m  Patient does not use birth control, not sexually active at this time. General: 43 year old female in no acute distress. Head: Normocephalic and atraumatic. Eyes:  Sclerae non-icteric, conjunctive pink. Ears: Normal auditory acuity. Mouth: Dentition intact. No ulcers or lesions.  Neck: Supple, no lymphadenopathy or thyromegaly.  Lungs: Clear bilaterally to auscultation without wheezes, crackles or rhonchi. Heart: Regular rate and rhythm.  1/6 systolic murmur.  No rubs or gallops. Abdomen: Soft, obese abdomen.  Nontender, nondistended. No masses. No hepatosplenomegaly.  No ascites.  Normoactive bowel sounds x 4 quadrants.  Rectal: Deferred. Musculoskeletal: Symmetrical with no gross deformities. Skin: Warm and dry. No rash or lesions on visible extremities. Extremities: No edema. Neurological: Alert oriented x 4, no focal deficits.  Psychological: Alert and cooperative. Normal mood and affect.  ASSESSMENT AND PLAN:  43 year old female with chronic hepatitis B cirrhosis. On Tenofovir DF 300mg  once daily. RUQ sonogram 02/25/2024 identified evidence of hepatic steatosis with a 0.6 x 0.7 x 1 cm mass in the right liver lobe, possible hemangioma and the gallbladder was normal. CTAP with and without contrast 03/22/2024 identified cirrhosis, a tiny nonenhancing cyst versus 4 filling hemangioma in the right liver lobe measuring 1 cm and small varices throughout the  LUQ and ventral abdomen without ascites. Labs 02/10/2024: Hep B DNA undetectable.  AFP 2.1.  MELD 3.0: 7.  - CBC, CMP and PT/INR - EGD to survey for esophageal and gastric varices benefits and risks discussed including risk with sedation, risk of bleeding, perforation and infection  - Continue to avoid NSAIDS  - Continue Tenofovir DF once daily indefinitely - Reduce carbohydrates in diet, exercise as tolerated. Weight loss recommended.  - Due for follow up at [facility] 08/2024  Colon cancer screening  - Screening colonoscopy recommended at the age of 39   CC:  Shelda Atlas, MD

## 2024-05-22 ENCOUNTER — Ambulatory Visit: Payer: Self-pay | Admitting: Nurse Practitioner

## 2024-05-22 NOTE — Progress Notes (Signed)
 Agree with assessment and plan as outlined.

## 2024-05-28 ENCOUNTER — Ambulatory Visit: Admitting: Gastroenterology

## 2024-05-28 ENCOUNTER — Encounter: Payer: Self-pay | Admitting: Gastroenterology

## 2024-05-28 VITALS — BP 137/91 | HR 69 | Temp 97.9°F | Resp 21 | Ht 59.0 in | Wt 197.0 lb

## 2024-05-28 DIAGNOSIS — K3189 Other diseases of stomach and duodenum: Secondary | ICD-10-CM | POA: Diagnosis not present

## 2024-05-28 DIAGNOSIS — K7460 Unspecified cirrhosis of liver: Secondary | ICD-10-CM | POA: Diagnosis not present

## 2024-05-28 DIAGNOSIS — K319 Disease of stomach and duodenum, unspecified: Secondary | ICD-10-CM

## 2024-05-28 MED ORDER — SODIUM CHLORIDE 0.9 % IV SOLN: 500.0000 mL | Freq: Once | INTRAVENOUS | Status: DC

## 2024-05-28 NOTE — Progress Notes (Signed)
 History and Physical Interval Note: Patient here for EGD to screen for esophageal varices. Last exam 05/2021. History of cirrhosis secondary to hepatitis B. On tenofovir. No interval changes since office visit 11/14.    05/28/2024 1:26 PM  Brenda Cannon  has presented today for endoscopic procedure(s), with the diagnosis of  Encounter Diagnosis  Name Primary?   Hepatic cirrhosis, unspecified hepatic cirrhosis type, unspecified whether ascites present (HCC) Yes  .  The various methods of evaluation and treatment have been discussed with the patient and/or family. After consideration of risks, benefits and other options for treatment, the patient has consented to  the endoscopic procedure(s).   The patient's history has been reviewed, patient examined, no change in status, stable for surgery.  I have reviewed the patient's chart and labs. The patient was provided an opportunity to ask questions and all were answered. The patient agreed with the plan.    Marcey Naval, MD [facility]

## 2024-05-28 NOTE — Op Note (Signed)
 [facility] Patient Name: Brenda Cannon Procedure Date: 05/28/2024 1:10 PM MRN: [id number] Endoscopist: Elspeth P. Leigh , MD, [id number] Age: 43 Referring MD:  Date of Birth: 10/03/1980 Gender: Female Account #: [id number] Procedure:                Upper GI endoscopy Indications:              Cirrhosis- screening for esophageal varices Medicines:                Monitored Anesthesia Care Procedure:                Pre-Anesthesia Assessment:                           - Prior to the procedure, a History and Physical                            was performed, and patient medications and                            allergies were reviewed. The patient's tolerance of                            previous anesthesia was also reviewed. The risks                            and benefits of the procedure and the sedation                            options and risks were discussed with the patient.                            All questions were answered, and informed consent                            was obtained. Prior Anticoagulants: The patient has                            taken no anticoagulant or antiplatelet agents. ASA                            Grade Assessment: III - A patient with severe                            systemic disease. After reviewing the risks and                            benefits, the patient was deemed in satisfactory                            condition to undergo the procedure.                           After obtaining informed consent, the endoscope was  passed under direct vision. Throughout the                            procedure, the patient's blood pressure, pulse, and                            oxygen saturations were monitored continuously. The                            GIF [id number] #[id number] was introduced through the                            mouth, and advanced to the second part of duodenum.                             The upper GI endoscopy was accomplished without                            difficulty. The patient tolerated the procedure                            well. Scope In: Scope Out: Findings:                 Esophagogastric landmarks were identified: the                            Z-line was found at 36 cm, the gastroesophageal                            junction was found at 36 cm and the upper extent of                            the gastric folds was found at 36 cm from the                            incisors.                           The exam of the esophagus was otherwise normal. No                            varices.                           Patchy mildly erythematous mucosa was found in the                            gastric fundus and in the gastric body. Biopsies                            were taken with a cold forceps for Helicobacter                            pylori testing.  The exam of the stomach was otherwise normal. No                            varices.                           The examined duodenum was normal. Complications:            No immediate complications. Estimated blood loss:                            Minimal. Estimated Blood Loss:     Estimated blood loss was minimal. Impression:               - Esophagogastric landmarks identified.                           - Normal esophagus otherwise - no varices.                           - Erythematous mucosa in the gastric fundus and                            gastric body. Biopsied.                           - Normal stomach otherwise.                           - Normal examined duodenum. Recommendation:           - Patient has a contact number available for                            emergencies. The signs and symptoms of potential                            delayed complications were discussed with the                            patient. Return to normal activities tomorrow.                             Written discharge instructions were provided to the                            patient.                           - Resume previous diet.                           - Continue present medications.                           - Await pathology results. Elspeth P. Clayvon Parlett, MD 05/28/2024 1:52:16 PM This report has been signed electronically.

## 2024-05-28 NOTE — Patient Instructions (Signed)

## 2024-05-28 NOTE — Progress Notes (Signed)
1340 Robinul 0.1 mg IV given due large amount of secretions upon assessment.  MD made aware, vss 

## 2024-05-28 NOTE — Progress Notes (Signed)
 Pt's states no medical or surgical changes since previsit or office visit.

## 2024-05-28 NOTE — Progress Notes (Signed)
 Called to room to assist during endoscopic procedure.  Patient ID and intended procedure confirmed with present staff. Received instructions for my participation in the procedure from the performing physician.

## 2024-05-28 NOTE — Progress Notes (Signed)
 Report given to PACU, vss

## 2024-05-31 ENCOUNTER — Telehealth: Payer: Self-pay

## 2024-05-31 NOTE — Telephone Encounter (Signed)
  Follow up Call-     05/28/2024   12:33 PM  Call back number  Post procedure Call Back phone  # [phone]  Permission to leave phone message Yes     Patient questions:  Do you have a fever, pain , or abdominal swelling? No. Pain Score  0 *  Have you tolerated food without any problems? Yes.    Have you been able to return to your normal activities? Yes.    Do you have any questions about your discharge instructions: Diet   No. Medications  No. Follow up visit  No.  Do you have questions or concerns about your Care? No.  Actions: * If pain score is 4 or above: No action needed, pain <4.

## 2024-06-04 LAB — SURGICAL PATHOLOGY

## 2024-06-06 ENCOUNTER — Ambulatory Visit: Payer: Self-pay | Admitting: Gastroenterology

## 2024-06-14 ENCOUNTER — Other Ambulatory Visit: Payer: Self-pay

## 2024-06-14 ENCOUNTER — Encounter (HOSPITAL_COMMUNITY): Payer: Self-pay | Admitting: *Deleted

## 2024-06-14 ENCOUNTER — Emergency Department (HOSPITAL_COMMUNITY)
Admission: EM | Admit: 2024-06-14 | Discharge: 2024-06-14 | Disposition: A | Discharge status: Home / Self Care | Attending: Emergency Medicine | Admitting: Emergency Medicine

## 2024-06-14 ENCOUNTER — Emergency Department (HOSPITAL_COMMUNITY)

## 2024-06-14 DIAGNOSIS — R519 Headache, unspecified: Secondary | ICD-10-CM

## 2024-06-14 LAB — I-STAT CHEM 8, ED
BUN: 14 mg/dL (ref 6–20)
Calcium, Ion: 1.16 mmol/L (ref 1.15–1.40)
Chloride: 97 mmol/L — ABNORMAL LOW (ref 98–111)
Creatinine, Ser: 0.8 mg/dL (ref 0.44–1.00)
Glucose, Bld: 192 mg/dL — ABNORMAL HIGH (ref 70–99)
HCT: 42 % (ref 36.0–46.0)
Hemoglobin: 14.3 g/dL (ref 12.0–15.0)
Potassium: 3.8 mmol/L (ref 3.5–5.1)
Sodium: 143 mmol/L (ref 135–145)
TCO2: 24 mmol/L (ref 22–32)

## 2024-06-14 LAB — CBC WITH DIFFERENTIAL/PLATELET
Abs Immature Granulocytes: 0.01 K/uL (ref 0.00–0.07)
Basophils Absolute: 0 K/uL (ref 0.0–0.1)
Basophils Relative: 0 %
Eosinophils Absolute: 0.1 K/uL (ref 0.0–0.5)
Eosinophils Relative: 2 %
HCT: 40 % (ref 36.0–46.0)
Hemoglobin: 12.7 g/dL (ref 12.0–15.0)
Immature Granulocytes: 0 %
Lymphocytes Relative: 36 %
Lymphs Abs: 1.1 K/uL (ref 0.7–4.0)
MCH: 27.3 pg (ref 26.0–34.0)
MCHC: 31.8 g/dL (ref 30.0–36.0)
MCV: 86 fL (ref 80.0–100.0)
Monocytes Absolute: 0.3 K/uL (ref 0.1–1.0)
Monocytes Relative: 10 %
Neutro Abs: 1.6 K/uL — ABNORMAL LOW (ref 1.7–7.7)
Neutrophils Relative %: 52 %
Platelets: 142 K/uL — ABNORMAL LOW (ref 150–400)
RBC: 4.65 MIL/uL (ref 3.87–5.11)
RDW: 13.6 % (ref 11.5–15.5)
WBC: 3.1 K/uL — ABNORMAL LOW (ref 4.0–10.5)
nRBC: 0 % (ref 0.0–0.2)

## 2024-06-14 MED ORDER — KETOROLAC TROMETHAMINE 15 MG/ML IJ SOLN
10.0000 mg | Freq: Once | INTRAMUSCULAR | Status: AC
  Administered 2024-06-14: 10 mg via INTRAVENOUS
  Filled 2024-06-14: qty 1

## 2024-06-14 MED ORDER — IOHEXOL 350 MG/ML SOLN
75.0000 mL | Freq: Once | INTRAVENOUS | Status: AC | PRN
  Administered 2024-06-14: 75 mL via INTRAVENOUS

## 2024-06-14 MED ORDER — METOCLOPRAMIDE HCL 5 MG/ML IJ SOLN
10.0000 mg | Freq: Once | INTRAMUSCULAR | Status: AC
  Administered 2024-06-14: 10 mg via INTRAVENOUS
  Filled 2024-06-14: qty 2

## 2024-06-14 NOTE — Discharge Instructions (Addendum)
 We saw you in the ER for headaches. All the labs and imaging are normal. We are not sure what is causing your headaches, however, there appears to be no evidence of infection, bleeds or tumors based on our exam and results. Please monitor the triggers for your headache and what makes it better or worse.  Particularly, see if the symptoms are worse at nighttime, or when you are laying down and if there is any vision disturbance or nausea associated with the headache.  Please take motrin  for headache - See your doctor if the pain persists, as you might need better medications or a specialist.  Please return to the ER if the headache gets severe and in not improving, you have associated new one sided numbness, tingling, weakness or confusion, seizures, poor balance or poor vision.

## 2024-06-14 NOTE — ED Triage Notes (Signed)
 Pt states she has been having a headache for the past 2 weeks off and on. Pt denies any other associated symptoms or complaints.

## 2024-06-14 NOTE — ED Provider Notes (Addendum)
 [place] [facility] AT [facility] Provider Note   CSN: [id number] Arrival date & time: 06/14/24  0725     Patient presents with: Headache   Brenda Cannon is a 43 y.o. female.   HPI     43 year old female comes with chief complaint of headache.  Patient has history of chronic hepatitis, thrombocytopenia.  She reports that she has been having headaches off and on for the last several days.  In the last 2 weeks, the headaches are more frequent.  Typically the headache is worse at night.  The headache is located in the front and in the back.  She describes the headache as throbbing.  Last night the pain was severe, she could not sleep well.  She normally takes Tylenol , and the pain does subside or resolve.  She does not have any neck pain.  Review of system is negative for any one-sided weakness, numbness, slurred speech, vision loss or blurry vision.  Prior to Admission medications   Medication Sig Start Date End Date Taking? Authorizing Provider  azelastine  (ASTELIN ) 0.1 % nasal spray Place 2 sprays into both nostrils 2 (two) times daily. Use in each nostril as directed 02/06/24   Johnie Flaming A, NP  budesonide -formoterol  (SYMBICORT ) 80-4.5 MCG/ACT inhaler Inhale 2 puffs into the lungs in the morning and at bedtime. 05/16/24   Enedelia Dorna HERO, FNP  cetirizine  (ZYRTEC  ALLERGY) 10 MG tablet Take 1 tablet (10 mg total) by mouth daily. 02/06/24   Johnie Flaming A, NP  fluticasone  (FLONASE ) 50 MCG/ACT nasal spray Place 1 spray into both nostrils daily. 05/17/24   [provider]  guaiFENesin  (MUCINEX ) 600 MG 12 hr tablet Take 1 tablet (600 mg total) by mouth 2 (two) times daily. 05/16/24   Enedelia Dorna HERO, FNP  metFORMIN  (GLUCOPHAGE ) 500 MG tablet Take 500 mg by mouth every other day.    [provider]  omeprazole  (PRILOSEC) 20 MG capsule Take 20 mg by mouth 2 (two) times daily. 08/28/23   [provider]   promethazine -dextromethorphan (PROMETHAZINE -DM) 6.25-15 MG/5ML syrup Take 5 mLs by mouth at bedtime as needed for cough. 05/16/24   Enedelia Dorna HERO, FNP  tenofovir (VIREAD) 300 MG tablet Take 300 mg by mouth daily.    [provider]  Vitamin D, Ergocalciferol, (DRISDOL) 1.25 MG (50000 UNIT) CAPS capsule Take 50,000 Units by mouth once a week. 12/09/23   [provider]    Allergies: Cephalexin  and Chloroquine    Review of Systems  All other systems reviewed and are negative.   Updated Vital Signs BP (!) 143/92   Pulse 70   Temp 98.1 F (36.7 C) (Oral)   Resp 18   Ht 5' 1 (1.549 m)   Wt 89.8 kg   LMP 05/15/2024 (Exact Date)   SpO2 97%   BMI 37.41 kg/m   Physical Exam Vitals and nursing note reviewed.  Constitutional:      Appearance: She is well-developed.  HENT:     Head: Atraumatic.  Eyes:     General: No visual field deficit. Cardiovascular:     Rate and Rhythm: Normal rate.  Pulmonary:     Effort: Pulmonary effort is normal.  Musculoskeletal:     Cervical back: Normal range of motion and neck supple.  Skin:    General: Skin is warm and dry.  Neurological:     Mental Status: She is alert and oriented to person, place, and time.     Cranial Nerves: No  cranial nerve deficit, dysarthria or facial asymmetry.     Sensory: No sensory deficit.     Motor: No weakness.     (all labs ordered are listed, but only abnormal results are displayed) Labs Reviewed  CBC WITH DIFFERENTIAL/PLATELET - Abnormal; Notable for the following components:      Result Value   WBC 3.1 (*)    Platelets 142 (*)    Neutro Abs 1.6 (*)    All other components within normal limits  I-STAT CHEM 8, ED - Abnormal; Notable for the following components:   Chloride 97 (*)    Glucose, Bld 192 (*)    All other components within normal limits    EKG: None  Radiology: CT VENOGRAM HEAD Result Date: 06/14/2024 EXAM: CT VENOGRAM WITH CONTAST 06/14/2024 09:09:49 AM  TECHNIQUE: CT venogram of the head/brain was performed with the administration of 75 mL of iohexol  (OMNIPAQUE ) 350 MG/ML injection. Multiplanar reformatted images are provided for review. MIP images are provided for review. Automated exposure control, iterative reconstruction, and/or weight based adjustment of the mA/kV was utilized to reduce the radiation dose to as low as reasonably achievable. COMPARISON: CT of the head dated 04/10/2020. CLINICAL HISTORY: Headache, intracranial hypertension features; Headache, worse with laying flat and known history of liver disease. FINDINGS: BRAIN/VENTRICLES: No acute intracranial hemorrhage. No extra axial fluid collection. Gray-white differentiation is maintained. No mass effect or midline shift. No hydrocephalus. ORBITS: No acute abnormality. SINUSES AND MASTOIDS: No acute abnormality. SOFT TISSUES AND SKULL: No acute abnormality. CT VENOGRAM: No dural venous sinus thrombosis. No significant stenosis. IMPRESSION: 1. No acute intracranial abnormality. 2. No dural venous sinus thrombosis. Electronically signed by: Evalene Coho MD 06/14/2024 09:19 AM EST RP Workstation: [id number]     Procedures   Medications Ordered in the ED  ketorolac  (TORADOL ) 15 MG/ML injection 10 mg (has no administration in time range)  metoCLOPramide  (REGLAN ) injection 10 mg (has no administration in time range)  metoCLOPramide  (REGLAN ) injection 10 mg (10 mg Intravenous Given 06/14/24 0829)  iohexol  (OMNIPAQUE ) 350 MG/ML injection 75 mL (75 mLs Intravenous Contrast Given 06/14/24 0906)    Clinical Course as of 06/14/24 0942  Mon Jun 14, 2024  0939 Upon reassessment, patient reports that the headache has improved. She continued to have no neurologic complains. Strict return precautions discussed, pt will return to the ER if there is visual complains, seizures, altered mental status, loss of consciousness, dizziness, new focal weakness, or numbness.   I advised that patient take some  ibuprofen  and follow-up with her PCP if the headache does not get better in the next 10 to 14 days.  I also advised that she be careful with Tylenol  ingestion given her liver issues. [AN]  0940 At this time, I feel uncomfortable giving patient diagnosis of IIH.  However, I advised patient to keep an eye on the morphology of the headache and the triggers, aggravating relieving factors and to return to the ER if she starts developing any visual disturbance or neurologic symptoms. [AN]    Clinical Course User Index [AN] Charlyn Sora, MD                                 Medical Decision Making Amount and/or Complexity of Data Reviewed Labs: ordered. Radiology: ordered.  Risk Prescription drug management.   43 year old patient comes in with cc of headaches. Pertinent past medical includes chronic hepatitis C, thrombocytopenia, prediabetes. Collateral  history provided by reviewing patient's records including CMP, platelet count.  Differential diagnosis consideration for this patient includes: Primary headaches - including migrainous headaches, cluster headaches, tension headaches. ICH Carotid dissection Cavernous sinus thrombosis/venous dural thrombosis Meningitis Encephalitis Sinusitis Tumor Vascular headaches AV malformation Brain aneurysm Muscular headaches  Assessment and plan: We will start with CT venogram.  Patient's headaches have been present for several weeks, I noticed that she was seen in urgent care in May for headache as well.  In the last 2 weeks, the pain has become more frequent, and she reports that the headaches are worse at night.  Her liver disease does lead her to hypercoagulability state.  If the workup is negative, we will advise PCP follow-up.  Final diagnoses:  Bad headache    ED Discharge Orders     None          Charlyn Sora, MD 06/14/24 9059    Charlyn Sora, MD 06/14/24 [phone]

## 2024-06-17 ENCOUNTER — Ambulatory Visit (HOSPITAL_COMMUNITY)
Admission: EM | Admit: 2024-06-17 | Discharge: 2024-06-17 | Disposition: A | Discharge status: Home / Self Care | Attending: Emergency Medicine | Admitting: Emergency Medicine

## 2024-06-17 ENCOUNTER — Encounter (HOSPITAL_COMMUNITY): Payer: Self-pay | Admitting: Emergency Medicine

## 2024-06-17 DIAGNOSIS — R040 Epistaxis: Secondary | ICD-10-CM

## 2024-06-17 DIAGNOSIS — M542 Cervicalgia: Secondary | ICD-10-CM | POA: Diagnosis not present

## 2024-06-17 DIAGNOSIS — R0981 Nasal congestion: Secondary | ICD-10-CM | POA: Diagnosis not present

## 2024-06-17 MED ORDER — KETOROLAC TROMETHAMINE 30 MG/ML IJ SOLN
INTRAMUSCULAR | Status: AC
  Filled 2024-06-17: qty 1

## 2024-06-17 MED ORDER — CYCLOBENZAPRINE HCL 10 MG PO TABS: 10.0000 mg | ORAL_TABLET | Freq: Two times a day (BID) | ORAL | 0 refills | Status: DC | PRN

## 2024-06-17 MED ORDER — IBUPROFEN 600 MG PO TABS: 600.0000 mg | ORAL_TABLET | Freq: Four times a day (QID) | ORAL | 0 refills | Status: AC | PRN

## 2024-06-17 MED ORDER — KETOROLAC TROMETHAMINE 30 MG/ML IJ SOLN
30.0000 mg | Freq: Once | INTRAMUSCULAR | Status: AC
  Administered 2024-06-17: 30 mg via INTRAMUSCULAR

## 2024-06-17 NOTE — ED Provider Notes (Signed)
 [facility]    CSN: [id number] Arrival date & time: 06/17/24  1353      History   Chief Complaint Chief Complaint  Patient presents with   Neck Pain   Shoulder Pain    HPI Brenda Cannon is a 43 y.o. female.   Patient presents with left sided neck pain that extends into her left shoulder that began upon waking this morning.  Patient states that her pain also seems to be radiating to her head and causing her to have a headache.  Patient states that she feels like she may have slept wrong and now has pain from this.  Patient reports that she was seen in the past for neck pain and was given some medication with significant relief and would like the same that she was given during that visit.  Patient is unsure of the names of the medications that she was prescribed at that time.  Upon chart review patient was seen on 06/03/2023 for neck pain and was given an injection of Toradol  and prescribed ibuprofen  and Flexeril  for pain.  Patient states that while she was at work a radio broadcast assistant checked her blood pressure and it was elevated at 157/101 and therefore she was concerned that her pain could be related to elevated blood pressure.  Patient denies any blurred vision, weakness, numbness, confusion, slurred speech, and facial droop.  Patient states that while she was at work she did have an episode where she coughed up a little bit of blood and then began to have a nosebleed.  Patient states that she has been dealing with some ongoing congestion and did use a nasal spray at this morning as she usually does.  Patient denies any cough, shortness of breath, or chest pain.  The history is provided by the patient and medical records.  Neck Pain Shoulder Pain Associated symptoms: neck pain     Past Medical History:  Diagnosis Date   Abnormal Pap smear    ascus with High Risk for HPV   Carpal tunnel syndrome    Cirrhosis (HCC)    De Quervain's tenosynovitis, left    Difficulty  reading    Gestational diabetes    GDM with last pregnancy (2008)   History of gestational diabetes    History of hepatitis B 07/08/2005   History of malaria 07/08/2001   History of thrombocytopenia    Kidney stones    Obese    Plantar fasciitis     Patient Active Problem List   Diagnosis Date Noted   Papanicolaou smear of cervix with positive high risk human papilloma virus (HPV) test 11/08/2020   Carpal tunnel syndrome, bilateral 05/31/2020   Type 2 diabetes mellitus with obesity 03/24/2015   Metatarsal stress fracture of left foot 11/10/2013   Allergic rhinitis 05/26/2012   Carpal tunnel syndrome of left wrist 04/15/2011   History of malaria 03/04/2011   Chronic hepatitis B (HCC) 03/01/2011   H/O: C-section 03/01/2011   History of thrombocytopenia 03/01/2011    Past Surgical History:  Procedure Laterality Date   CESAREAN SECTION  2008   last pregnacy   CESAREAN SECTION  08/21/2011   Procedure: CESAREAN SECTION;  Surgeon: Glenys GORMAN Birk, MD;  Location: [facility];  Service: Gynecology;  Laterality: N/A;    OB History     Gravida  5   Para  3   Term  3   Preterm  0   AB  2   Living  3  SAB  1   IAB      Ectopic  0   Multiple  0   Live Births  3            Home Medications    Prior to Admission medications  Medication Sig Start Date End Date Taking? Authorizing Provider  cetirizine  (ZYRTEC  ALLERGY) 10 MG tablet Take 1 tablet (10 mg total) by mouth daily. 06/17/24  Yes Johnie, Freja Faro A, NP  cyclobenzaprine  (FLEXERIL ) 10 MG tablet Take 1 tablet (10 mg total) by mouth 2 (two) times daily as needed for muscle spasms. 06/17/24  Yes Johnie, Garnett Rekowski A, NP  ibuprofen  (ADVIL ) 600 MG tablet Take 1 tablet (600 mg total) by mouth every 6 (six) hours as needed. 06/17/24  Yes Johnie, Zakaree Mcclenahan A, NP  azelastine  (ASTELIN ) 0.1 % nasal spray Place 2 sprays into both nostrils 2 (two) times daily. Use in each nostril as directed 02/06/24   Johnie Flaming A, NP   budesonide -formoterol  (SYMBICORT ) 80-4.5 MCG/ACT inhaler Inhale 2 puffs into the lungs in the morning and at bedtime. 05/16/24   Enedelia Dorna HERO, FNP  fluticasone  (FLONASE ) 50 MCG/ACT nasal spray Place 1 spray into both nostrils daily. 05/17/24   [provider]  guaiFENesin  (MUCINEX ) 600 MG 12 hr tablet Take 1 tablet (600 mg total) by mouth 2 (two) times daily. 05/16/24   Enedelia Dorna HERO, FNP  metFORMIN  (GLUCOPHAGE ) 500 MG tablet Take 500 mg by mouth every other day.    [provider]  omeprazole  (PRILOSEC) 20 MG capsule Take 20 mg by mouth 2 (two) times daily. 08/28/23   [provider]  promethazine -dextromethorphan (PROMETHAZINE -DM) 6.25-15 MG/5ML syrup Take 5 mLs by mouth at bedtime as needed for cough. 05/16/24   Enedelia Dorna HERO, FNP  tenofovir (VIREAD) 300 MG tablet Take 300 mg by mouth daily.    [provider]  Vitamin D, Ergocalciferol, (DRISDOL) 1.25 MG (50000 UNIT) CAPS capsule Take 50,000 Units by mouth once a week. 12/09/23   [provider]    Family History Family History  Problem Relation Age of Onset   Hypertension Mother    Stroke Mother    Colon cancer Neg Hx    Esophageal cancer Neg Hx    Liver cancer Neg Hx     Social History Social History[1]   Allergies   Cephalexin  and Chloroquine   Review of Systems Review of Systems  Musculoskeletal:  Positive for neck pain.   Per HPI  Physical Exam Triage Vital Signs ED Triage Vitals  Encounter Vitals Group     BP 06/17/24 1423 133/88     Girls Systolic BP Percentile --      Girls Diastolic BP Percentile --      Boys Systolic BP Percentile --      Boys Diastolic BP Percentile --      Pulse Rate 06/17/24 1423 73     Resp 06/17/24 1423 18     Temp 06/17/24 1423 98.1 F (36.7 C)     Temp Source 06/17/24 1423 Oral     SpO2 06/17/24 1423 98 %     Weight --      Height --      Head Circumference --      Peak Flow --      Pain Score 06/17/24 1422 9      Pain Loc --      Pain Education --      Exclude from Growth Chart --    No  data found.  Updated Vital Signs BP 133/88 (BP Location: Right Arm)   Pulse 73   Temp 98.1 F (36.7 C) (Oral)   Resp 18   LMP 06/07/2024 (Approximate)   SpO2 98%   Visual Acuity Right Eye Distance:   Left Eye Distance:   Bilateral Distance:    Right Eye Near:   Left Eye Near:    Bilateral Near:     Physical Exam Vitals and nursing note reviewed.  Constitutional:      General: She is awake. She is not in acute distress.    Appearance: Normal appearance. She is well-developed and well-groomed. She is not ill-appearing.  HENT:     Right Ear: Tympanic membrane, ear canal and external ear normal.     Left Ear: Tympanic membrane, ear canal and external ear normal.     Nose: Congestion and rhinorrhea present.     Mouth/Throat:     Mouth: Mucous membranes are moist.     Pharynx: Posterior oropharyngeal erythema present. No oropharyngeal exudate.  Eyes:     Extraocular Movements: Extraocular movements intact.     Conjunctiva/sclera: Conjunctivae normal.     Pupils: Pupils are equal, round, and reactive to light.  Neck:     Comments: Mildly decreased range of motion secondary to pain.  Tenderness noted over left paraspinal musculature. Cardiovascular:     Rate and Rhythm: Normal rate and regular rhythm.  Pulmonary:     Effort: Pulmonary effort is normal.     Breath sounds: Normal breath sounds.  Musculoskeletal:     Left shoulder: Tenderness present. No swelling or deformity. Normal range of motion.     Cervical back: Neck supple. No rigidity. Pain with movement and muscular tenderness present. No spinous process tenderness. Decreased range of motion.     Comments: Tenderness also noted to left superior trapezius  Skin:    General: Skin is warm and dry.  Neurological:     General: No focal deficit present.     Mental Status: She is alert and oriented to person, place, and time. Mental status is  at baseline.  Psychiatric:        Behavior: Behavior is cooperative.      UC Treatments / Results  Labs (all labs ordered are listed, but only abnormal results are displayed) Labs Reviewed - No data to display  EKG   Radiology No results found.  Procedures Procedures (including critical care time)  Medications Ordered in UC Medications  ketorolac  (TORADOL ) 30 MG/ML injection 30 mg (has no administration in time range)    Initial Impression / Assessment and Plan / UC Course  I have reviewed the triage vital signs and the nursing notes.  Pertinent labs & imaging results that were available during my care of the patient were reviewed by me and considered in my medical decision making (see chart for details).     Patient is overall well-appearing.  Vitals are stable.  Blood pressure was not elevated in clinic today.  Neck and shoulder pain likely muscular in nature.  Given IM Toradol  in clinic for acute pain.  Prescribed Flexeril  and ibuprofen  as needed for additional pain relief as this is what she was prescribed previously with relief.  Recommended patient hold off on using any nasal sprays as this could be contributing to her nosebleeds and recommended starting cetirizine  once daily to help with congestion instead.  Discussed follow-up, return, and strict ER precautions. Final Clinical Impressions(s) / UC Diagnoses   Final diagnoses:  Neck pain  Nosebleed  Nasal congestion     Discharge Instructions      As discussed I believe your neck pain and shoulder pain are likely muscular in nature. You were given an injection of Toradol  in clinic today to help with your pain. 1 tablet of 600 mg every 6 hours as needed for additional pain relief. I have also prescribed Flexeril  you can take twice daily as needed for muscle pain and spasms.  This can make you drowsy so do not drive, work, or drink alcohol while taking this. Alternate between ice and heat and do some gentle  stretching to help with pain.  I do believe your nosebleed was likely related to your ongoing congestion and use of nasal spray this morning.  I would avoid nasal spray over the next few days as this can worsen your symptoms or cause you to have another nosebleed. Start taking 1 tablet of cetirizine  once daily to help with congestion in the meantime.  Your blood pressure was elevated in clinic today.  Therefore I do not believe that this is related to your symptoms and this could have been elevated while you are at work due to acute pain.  Follow-up with your primary care provider or return here as needed.    ED Prescriptions     Medication Sig Dispense Auth. Provider   cetirizine  (ZYRTEC  ALLERGY) 10 MG tablet Take 1 tablet (10 mg total) by mouth daily. 30 tablet Johnie Flaming A, NP   ibuprofen  (ADVIL ) 600 MG tablet Take 1 tablet (600 mg total) by mouth every 6 (six) hours as needed. 30 tablet Johnie Flaming A, NP   cyclobenzaprine  (FLEXERIL ) 10 MG tablet Take 1 tablet (10 mg total) by mouth 2 (two) times daily as needed for muscle spasms. 20 tablet Johnie Flaming A, NP      PDMP not reviewed this encounter.    [1]  Social History Tobacco Use   Smoking status: Never   Smokeless tobacco: Never  Vaping Use   Vaping status: Never Used  Substance Use Topics   Alcohol use: No   Drug use: No     Johnie Flaming LABOR, NP 06/17/24 1456

## 2024-06-17 NOTE — ED Triage Notes (Signed)
 Pt reports woke up with pain in neck and left shoulder and headache. Then at work had blood come out her nose and mouth. Had her BP checked at work 157-101. Pt doesn't have hx of HTN.

## 2024-06-17 NOTE — Discharge Instructions (Signed)
 As discussed I believe your neck pain and shoulder pain are likely muscular in nature. You were given an injection of Toradol  in clinic today to help with your pain. 1 tablet of 600 mg every 6 hours as needed for additional pain relief. I have also prescribed Flexeril  you can take twice daily as needed for muscle pain and spasms.  This can make you drowsy so do not drive, work, or drink alcohol while taking this. Alternate between ice and heat and do some gentle stretching to help with pain.  I do believe your nosebleed was likely related to your ongoing congestion and use of nasal spray this morning.  I would avoid nasal spray over the next few days as this can worsen your symptoms or cause you to have another nosebleed. Start taking 1 tablet of cetirizine  once daily to help with congestion in the meantime.  Your blood pressure was elevated in clinic today.  Therefore I do not believe that this is related to your symptoms and this could have been elevated while you are at work due to acute pain.  Follow-up with your primary care provider or return here as needed.

## 2024-07-21 ENCOUNTER — Ambulatory Visit (HOSPITAL_COMMUNITY): Admission: EM | Admit: 2024-07-21 | Discharge: 2024-07-21 | Disposition: A | Discharge status: Home / Self Care

## 2024-07-21 ENCOUNTER — Encounter (HOSPITAL_COMMUNITY): Payer: Self-pay

## 2024-07-21 DIAGNOSIS — G44201 Tension-type headache, unspecified, intractable: Secondary | ICD-10-CM

## 2024-07-21 DIAGNOSIS — S161XXA Strain of muscle, fascia and tendon at neck level, initial encounter: Secondary | ICD-10-CM | POA: Diagnosis not present

## 2024-07-21 MED ORDER — KETOROLAC TROMETHAMINE 60 MG/2ML IM SOLN
60.0000 mg | Freq: Once | INTRAMUSCULAR | Status: AC
  Administered 2024-07-21: 60 mg via INTRAMUSCULAR

## 2024-07-21 MED ORDER — KETOROLAC TROMETHAMINE 60 MG/2ML IM SOLN
INTRAMUSCULAR | Status: AC
  Filled 2024-07-21: qty 2

## 2024-07-21 NOTE — ED Triage Notes (Signed)
 Patient here today with c/o right side neck pain that radiates down to her right shoulder and also causing her to have a headache on the right side of her head. Patient has been taking Robaxin  and Diclofenac  with no relief.

## 2024-07-21 NOTE — ED Provider Notes (Signed)
 " [facility]  Note:  This document was prepared using Dragon voice recognition software and may include unintentional dictation errors.  MRN: [id number] DOB: 01-19-1981  Subjective:   Brenda Cannon is a 44 y.o. female presenting for right sided neck pain with radiation to the right shoulder and up to the right posterior head x 1 week.  Patient has been taking diclofenac  and Robaxin  with minimal to no relief.  Patient denies any blurred vision, dizziness, weakness, fatigue, fever.  Patient was seen by primary care provider last week who prescribed medication and did other testing.  Patient denies any known injury or trauma.  Current Medications[1]   Allergies[2]  Past Medical History:  Diagnosis Date   Abnormal Pap smear    ascus with High Risk for HPV   Carpal tunnel syndrome    Cirrhosis (HCC)    De Quervain's tenosynovitis, left    Difficulty reading    Gestational diabetes    GDM with last pregnancy (2008)   History of gestational diabetes    History of hepatitis B 07/08/2005   History of malaria 07/08/2001   History of thrombocytopenia    Kidney stones    Obese    Plantar fasciitis      Past Surgical History:  Procedure Laterality Date   CESAREAN SECTION  2008   last pregnacy   CESAREAN SECTION  08/21/2011   Procedure: CESAREAN SECTION;  Surgeon: Glenys GORMAN Birk, MD;  Location: [facility];  Service: Gynecology;  Laterality: N/A;    Family History  Problem Relation Age of Onset   Hypertension Mother    Stroke Mother    Colon cancer Neg Hx    Esophageal cancer Neg Hx    Liver cancer Neg Hx     Social History[3]  ROS Refer to HPI for ROS details.  Objective:    Vitals: BP (!) 147/85 (BP Location: Left Arm)   Pulse 71   Temp 98.2 F (36.8 C) (Oral)   Resp 16   LMP 07/08/2024 (Approximate)   SpO2 96%   Physical Exam Vitals and nursing note reviewed.  Constitutional:      General: She is not in acute distress.    Appearance: Normal  appearance. She is well-developed. She is not ill-appearing or toxic-appearing.  HENT:     Head: Normocephalic and atraumatic.  Cardiovascular:     Rate and Rhythm: Normal rate.  Pulmonary:     Effort: Pulmonary effort is normal. No respiratory distress.     Breath sounds: No stridor. No wheezing.  Musculoskeletal:     Cervical back: Rigidity, torticollis and tenderness present. Pain with movement and muscular tenderness present. No spinous process tenderness. Decreased range of motion.  Skin:    General: Skin is warm and dry.  Neurological:     General: No focal deficit present.     Mental Status: She is alert and oriented to person, place, and time.  Psychiatric:        Mood and Affect: Mood normal.        Behavior: Behavior normal.     Procedures  No results found for this or any previous visit (from the past 24 hours).  Assessment and Plan :     Discharge Instructions       1. Acute intractable tension-type headache (Primary) 2. Acute strain of neck muscle, initial encounter - ketorolac  (TORADOL ) injection 60 mg given in UC for acute cervical muscle strain with tension headache - AMB referral to orthopedics for  follow-up evaluation and possible need for advanced imaging and possibly steroid injection. - Continue taking diclofenac  and Robaxin  as prescribed for muscle tension and pain - Apply heat to the area 2-3 times a day for 10 to 15 minutes at a time to help relieve muscle tension and pain.  -Continue to monitor symptoms for any change in severity if there is any escalation of current symptoms or development of new symptoms follow-up in ER for further evaluation and management.      Shemeca Lukasik B Keymani Glynn    [1] No current facility-administered medications for this encounter.  Current Outpatient Medications:    diclofenac  (VOLTAREN ) 75 MG EC tablet, Take 75 mg by mouth 2 (two) times daily as needed., Disp: , Rfl:    losartan (COZAAR) 25 MG tablet, Take 25 mg by  mouth daily., Disp: , Rfl:    methocarbamol  (ROBAXIN ) 500 MG tablet, Take 500 mg by mouth 3 (three) times daily as needed., Disp: , Rfl:    azelastine  (ASTELIN ) 0.1 % nasal spray, Place 2 sprays into both nostrils 2 (two) times daily. Use in each nostril as directed, Disp: 30 mL, Rfl: 0   budesonide -formoterol  (SYMBICORT ) 80-4.5 MCG/ACT inhaler, Inhale 2 puffs into the lungs in the morning and at bedtime. (Patient not taking: Reported on 07/21/2024), Disp: 1 each, Rfl: 0   cetirizine  (ZYRTEC  ALLERGY) 10 MG tablet, Take 1 tablet (10 mg total) by mouth daily., Disp: 30 tablet, Rfl: 0   fluticasone  (FLONASE ) 50 MCG/ACT nasal spray, Place 1 spray into both nostrils daily., Disp: , Rfl:    ibuprofen  (ADVIL ) 600 MG tablet, Take 1 tablet (600 mg total) by mouth every 6 (six) hours as needed., Disp: 30 tablet, Rfl: 0   metFORMIN  (GLUCOPHAGE ) 500 MG tablet, Take 500 mg by mouth every other day., Disp: , Rfl:    omeprazole  (PRILOSEC) 20 MG capsule, Take 20 mg by mouth 2 (two) times daily., Disp: , Rfl:    tenofovir (VIREAD) 300 MG tablet, Take 300 mg by mouth daily., Disp: , Rfl:    Vitamin D, Ergocalciferol, (DRISDOL) 1.25 MG (50000 UNIT) CAPS capsule, Take 50,000 Units by mouth once a week., Disp: , Rfl:  [2]  Allergies Allergen Reactions   Cephalexin  Itching and Rash   Chloroquine Hives  [3]  Social History Tobacco Use   Smoking status: Never   Smokeless tobacco: Never  Vaping Use   Vaping status: Never Used  Substance Use Topics   Alcohol use: No   Drug use: No     Aurea Goodell B, NP 07/21/24 1426  "

## 2024-07-21 NOTE — Discharge Instructions (Signed)
" °  1. Acute intractable tension-type headache (Primary) 2. Acute strain of neck muscle, initial encounter - ketorolac  (TORADOL ) injection 60 mg given in UC for acute cervical muscle strain with tension headache - AMB referral to orthopedics for follow-up evaluation and possible need for advanced imaging and possibly steroid injection. - Continue taking diclofenac  and Robaxin  as prescribed for muscle tension and pain - Apply heat to the area 2-3 times a day for 10 to 15 minutes at a time to help relieve muscle tension and pain.  -Continue to monitor symptoms for any change in severity if there is any escalation of current symptoms or development of new symptoms follow-up in ER for further evaluation and management. "

## 2024-08-06 ENCOUNTER — Ambulatory Visit (HOSPITAL_BASED_OUTPATIENT_CLINIC_OR_DEPARTMENT_OTHER): Admitting: Family

## 2024-11-12 ENCOUNTER — Ambulatory Visit (HOSPITAL_BASED_OUTPATIENT_CLINIC_OR_DEPARTMENT_OTHER): Admitting: Family
